# Patient Record
Sex: Female | Born: 1955 | Race: White | Hispanic: No
Health system: Midwestern US, Academic
[De-identification: ages and names within clinical notes are randomized; demographics above are authoritative.]

---

## 2018-07-01 ENCOUNTER — Encounter: Admit: 2018-07-01 | Discharge: 2018-07-02

## 2018-07-01 ENCOUNTER — Encounter: Admit: 2018-07-01 | Discharge: 2018-07-01

## 2018-07-02 ENCOUNTER — Encounter: Admit: 2018-07-02 | Discharge: 2018-07-03

## 2018-07-02 ENCOUNTER — Encounter: Admit: 2018-07-02 | Discharge: 2018-07-02

## 2018-07-07 ENCOUNTER — Encounter: Admit: 2018-07-07 | Discharge: 2018-07-07 | Payer: No Typology Code available for payment source

## 2018-07-08 ENCOUNTER — Encounter: Admit: 2018-07-08 | Discharge: 2018-07-08 | Payer: No Typology Code available for payment source

## 2018-07-08 DIAGNOSIS — C50912 Malignant neoplasm of unspecified site of left female breast: Principal | ICD-10-CM

## 2018-07-16 ENCOUNTER — Encounter: Admit: 2018-07-16 | Discharge: 2018-07-16 | Payer: No Typology Code available for payment source

## 2018-07-17 ENCOUNTER — Encounter: Admit: 2018-07-17 | Discharge: 2018-07-17 | Payer: No Typology Code available for payment source

## 2018-07-17 ENCOUNTER — Encounter: Admit: 2018-07-17 | Discharge: 2018-07-17 | Payer: BC Managed Care – HMO

## 2018-07-17 ENCOUNTER — Ambulatory Visit: Admit: 2018-07-17 | Discharge: 2018-07-17

## 2018-07-17 ENCOUNTER — Ambulatory Visit: Admit: 2018-07-17 | Discharge: 2018-07-18

## 2018-07-17 DIAGNOSIS — D0512 Intraductal carcinoma in situ of left breast: ICD-10-CM

## 2018-07-17 DIAGNOSIS — C50912 Malignant neoplasm of unspecified site of left female breast: ICD-10-CM

## 2018-07-17 DIAGNOSIS — Z170 Estrogen receptor positive status [ER+]: ICD-10-CM

## 2018-07-17 DIAGNOSIS — C50412 Malignant neoplasm of upper-outer quadrant of left female breast: Principal | ICD-10-CM

## 2018-07-17 DIAGNOSIS — Z9189 Other specified personal risk factors, not elsewhere classified: ICD-10-CM

## 2018-07-17 DIAGNOSIS — M1990 Unspecified osteoarthritis, unspecified site: Principal | ICD-10-CM

## 2018-07-20 ENCOUNTER — Encounter: Admit: 2018-07-20 | Discharge: 2018-07-20 | Payer: No Typology Code available for payment source

## 2018-07-20 ENCOUNTER — Encounter: Admit: 2018-07-20 | Discharge: 2018-07-20 | Payer: BC Managed Care – HMO

## 2018-07-20 DIAGNOSIS — C50412 Malignant neoplasm of upper-outer quadrant of left female breast: Principal | ICD-10-CM

## 2018-07-27 ENCOUNTER — Encounter: Admit: 2018-07-27 | Discharge: 2018-07-27 | Payer: No Typology Code available for payment source

## 2018-08-04 ENCOUNTER — Encounter: Admit: 2018-08-04 | Discharge: 2018-08-04 | Payer: No Typology Code available for payment source

## 2018-08-04 DIAGNOSIS — C50412 Malignant neoplasm of upper-outer quadrant of left female breast: Principal | ICD-10-CM

## 2018-08-04 MED ORDER — CEFAZOLIN INJ 1GM IVP
2 g | Freq: Once | INTRAVENOUS | 0 refills | Status: CN
Start: 2018-08-04 — End: ?

## 2018-08-06 ENCOUNTER — Encounter: Admit: 2018-08-06 | Discharge: 2018-08-06 | Payer: No Typology Code available for payment source

## 2018-08-06 DIAGNOSIS — M1990 Unspecified osteoarthritis, unspecified site: Principal | ICD-10-CM

## 2018-08-06 DIAGNOSIS — C801 Malignant (primary) neoplasm, unspecified: ICD-10-CM

## 2018-08-13 ENCOUNTER — Encounter: Admit: 2018-08-13 | Discharge: 2018-08-13 | Payer: 59

## 2018-08-14 ENCOUNTER — Ambulatory Visit: Admit: 2018-08-14 | Discharge: 2018-08-15

## 2018-08-14 ENCOUNTER — Ambulatory Visit: Admit: 2018-08-14 | Discharge: 2018-08-14 | Payer: BC Managed Care – HMO

## 2018-08-14 ENCOUNTER — Ambulatory Visit: Admit: 2018-08-14 | Discharge: 2018-08-14

## 2018-08-14 DIAGNOSIS — C50412 Malignant neoplasm of upper-outer quadrant of left female breast: Secondary | ICD-10-CM

## 2018-08-14 DIAGNOSIS — Z170 Estrogen receptor positive status [ER+]: Secondary | ICD-10-CM

## 2018-08-14 LAB — CBC AND DIFF
Lab: 0.1 10*3/uL (ref 0–0.20)
Lab: 0.2 10*3/uL (ref 0–0.45)
Lab: 0.7 10*3/uL (ref 0–0.80)
Lab: 1.7 10*3/uL (ref 1.0–4.8)
Lab: 10 % (ref 4–12)
Lab: 12 g/dL (ref 12.0–15.0)
Lab: 13 % (ref 11–15)
Lab: 25 % (ref 24–44)
Lab: 3 % (ref >60)
Lab: 3.8 M/UL — ABNORMAL LOW (ref 4.0–5.0)
Lab: 31 pg (ref 26–34)
Lab: 32 g/dL (ref 32.0–36.0)
Lab: 36 % (ref 36–45)
Lab: 4.2 10*3/uL (ref 1.8–7.0)
Lab: 6.9 K/UL (ref 4.5–11.0)
Lab: 61 % (ref 41–77)
Lab: 8.2 FL (ref 7–11)
Lab: 95 FL (ref 80–100)

## 2018-08-14 MED ORDER — LIDOCAINE 1% (BUFFERED) SYRINGE (BREAST CENTER)
4 mL | Freq: Once | INTRAMUSCULAR | 0 refills | Status: CP
Start: 2018-08-14 — End: ?

## 2018-08-15 LAB — COMPREHENSIVE METABOLIC PANEL
Lab: 140 MMOL/L (ref 137–147)
Lab: 4 MMOL/L (ref 3.5–5.1)

## 2018-08-16 ENCOUNTER — Encounter: Admit: 2018-08-16 | Discharge: 2018-08-16 | Payer: 59

## 2018-08-16 ENCOUNTER — Ambulatory Visit: Admit: 2018-08-16 | Discharge: 2018-08-16 | Payer: 59

## 2018-08-16 DIAGNOSIS — M1990 Unspecified osteoarthritis, unspecified site: Secondary | ICD-10-CM

## 2018-08-16 DIAGNOSIS — C801 Malignant (primary) neoplasm, unspecified: Secondary | ICD-10-CM

## 2018-08-17 ENCOUNTER — Ambulatory Visit: Admit: 2018-08-17 | Discharge: 2018-08-17 | Payer: 59

## 2018-08-17 ENCOUNTER — Encounter: Admit: 2018-08-17 | Discharge: 2018-08-17 | Payer: 59

## 2018-08-17 ENCOUNTER — Ambulatory Visit: Admit: 2018-08-17 | Discharge: 2018-08-17 | Payer: BC Managed Care – HMO

## 2018-08-17 ENCOUNTER — Ambulatory Visit: Admit: 2018-08-17 | Discharge: 2018-08-18

## 2018-08-17 DIAGNOSIS — Z803 Family history of malignant neoplasm of breast: Secondary | ICD-10-CM

## 2018-08-17 DIAGNOSIS — C801 Malignant (primary) neoplasm, unspecified: Secondary | ICD-10-CM

## 2018-08-17 DIAGNOSIS — Z170 Estrogen receptor positive status [ER+]: Secondary | ICD-10-CM

## 2018-08-17 DIAGNOSIS — Z8249 Family history of ischemic heart disease and other diseases of the circulatory system: Secondary | ICD-10-CM

## 2018-08-17 DIAGNOSIS — M1990 Unspecified osteoarthritis, unspecified site: Secondary | ICD-10-CM

## 2018-08-17 DIAGNOSIS — C50412 Malignant neoplasm of upper-outer quadrant of left female breast: Secondary | ICD-10-CM

## 2018-08-17 MED ORDER — METOCLOPRAMIDE HCL 5 MG/ML IJ SOLN
10 mg | Freq: Once | INTRAVENOUS | 0 refills | Status: DC | PRN
Start: 2018-08-17 — End: 2018-08-17

## 2018-08-17 MED ORDER — LIDOCAINE (PF) 10 MG/ML (1 %) IJ SOLN
.1-2 mL | Freq: Once | INTRAMUSCULAR | 0 refills | Status: CP
Start: 2018-08-17 — End: ?
  Administered 2018-08-17: 14:00:00 0.2 mL via INTRAMUSCULAR

## 2018-08-17 MED ORDER — FENTANYL CITRATE (PF) 50 MCG/ML IJ SOLN
0 refills | Status: DC
Start: 2018-08-17 — End: 2018-08-17
  Administered 2018-08-17 (×2): 25 ug via INTRAVENOUS

## 2018-08-17 MED ORDER — FENTANYL CITRATE (PF) 50 MCG/ML IJ SOLN
25 ug | INTRAVENOUS | 0 refills | Status: DC | PRN
Start: 2018-08-17 — End: 2018-08-17
  Administered 2018-08-17: 17:00:00 25 ug via INTRAVENOUS

## 2018-08-17 MED ORDER — ISOSULFAN BLUE 1 % SC SOLN
25 mg | Freq: Once | INTRAMUSCULAR | 0 refills | Status: DC
Start: 2018-08-17 — End: 2018-08-17

## 2018-08-17 MED ORDER — OXYCODONE 5 MG PO TAB
5-10 mg | Freq: Once | ORAL | 0 refills | Status: CP | PRN
Start: 2018-08-17 — End: ?
  Administered 2018-08-17: 16:00:00 5 mg via ORAL

## 2018-08-17 MED ORDER — ACETAMINOPHEN 325 MG PO TAB
650 mg | Freq: Once | ORAL | 0 refills | Status: CP
Start: 2018-08-17 — End: ?
  Administered 2018-08-17: 14:00:00 650 mg via ORAL

## 2018-08-17 MED ORDER — FENTANYL CITRATE (PF) 50 MCG/ML IJ SOLN
50 ug | INTRAVENOUS | 0 refills | Status: DC | PRN
Start: 2018-08-17 — End: 2018-08-17

## 2018-08-17 MED ORDER — DEXAMETHASONE SODIUM PHOSPHATE 10 MG/ML IJ SOLN
0 refills | Status: DC
Start: 2018-08-17 — End: 2018-08-17
  Administered 2018-08-17: 14:00:00 10 mg via INTRAVENOUS

## 2018-08-17 MED ORDER — ONDANSETRON HCL (PF) 4 MG/2 ML IJ SOLN
INTRAVENOUS | 0 refills | Status: DC
Start: 2018-08-17 — End: 2018-08-17
  Administered 2018-08-17: 15:00:00 4 mg via INTRAVENOUS

## 2018-08-17 MED ORDER — BUPIVACAINE 0.25 % (2.5 MG/ML) IJ SOLN
0 refills | Status: DC
Start: 2018-08-17 — End: 2018-08-17
  Administered 2018-08-17: 15:00:00 20 mL via INTRAMUSCULAR

## 2018-08-17 MED ORDER — LACTATED RINGERS IV SOLP
INTRAVENOUS | 0 refills | Status: DC
Start: 2018-08-17 — End: 2018-08-17
  Administered 2018-08-17: 14:00:00 1000.000 mL via INTRAVENOUS

## 2018-08-17 MED ORDER — SCOPOLAMINE BASE 1 MG OVER 3 DAYS TD PT3D
1 | Freq: Once | TRANSDERMAL | 0 refills | Status: DC
Start: 2018-08-17 — End: 2018-08-17
  Administered 2018-08-17: 14:00:00 1 via TRANSDERMAL

## 2018-08-17 MED ORDER — ISOSULFAN BLUE 1 % SC SOLN
0 refills | Status: DC
Start: 2018-08-17 — End: 2018-08-17
  Administered 2018-08-17: 14:00:00 2.5 mL via INTRAMUSCULAR

## 2018-08-17 MED ORDER — PATCH DOCUMENTATION - SCOPOLAMINE BASE 1 MG/72HR
1 | Freq: Two times a day (BID) | TRANSDERMAL | 0 refills | Status: DC
Start: 2018-08-17 — End: 2018-08-17

## 2018-08-17 MED ORDER — ACETAMINOPHEN 325 MG PO TAB
650 mg | ORAL_TABLET | ORAL | 0 refills | Status: AC
Start: 2018-08-17 — End: ?

## 2018-08-17 MED ORDER — OXYCODONE 5 MG PO TAB
5-10 mg | ORAL_TABLET | ORAL | 0 refills | 6.00000 days | Status: AC | PRN
Start: 2018-08-17 — End: 2018-11-23

## 2018-08-17 MED ORDER — FAMOTIDINE (PF) 20 MG/2 ML IV SOLN
0 refills | Status: DC
Start: 2018-08-17 — End: 2018-08-17
  Administered 2018-08-17: 14:00:00 20 mg via INTRAVENOUS

## 2018-08-17 MED ORDER — GABAPENTIN 300 MG PO CAP
300 mg | Freq: Once | ORAL | 0 refills | Status: CP
Start: 2018-08-17 — End: ?

## 2018-08-17 MED ORDER — LIDOCAINE (PF) 200 MG/10 ML (2 %) IJ SYRG
0 refills | Status: DC
Start: 2018-08-17 — End: 2018-08-17
  Administered 2018-08-17: 14:00:00 80 mg via INTRAVENOUS

## 2018-08-17 MED ORDER — RP DX TC-99M SULF COLL MCI
.5 | Freq: Once | INTRAVENOUS | 0 refills | Status: CP
Start: 2018-08-17 — End: ?
  Administered 2018-08-17: 13:00:00 0.5 via INTRAVENOUS

## 2018-08-17 MED ORDER — DIPHENHYDRAMINE HCL 50 MG/ML IJ SOLN
0 refills | Status: DC
Start: 2018-08-17 — End: 2018-08-17
  Administered 2018-08-17: 14:00:00 25 mg via INTRAVENOUS

## 2018-08-17 MED ORDER — PROPOFOL INJ 10 MG/ML IV VIAL
0 refills | Status: DC
Start: 2018-08-17 — End: 2018-08-17
  Administered 2018-08-17: 14:00:00 140 mg via INTRAVENOUS
  Administered 2018-08-17: 14:00:00 60 mg via INTRAVENOUS

## 2018-08-17 MED ORDER — SENNOSIDES-DOCUSATE SODIUM 8.6-50 MG PO TAB
1 | ORAL_TABLET | Freq: Two times a day (BID) | ORAL | 1 refills | Status: AC
Start: 2018-08-17 — End: 2018-11-23

## 2018-08-17 MED ORDER — CEFAZOLIN INJ 1GM IVP
2 g | Freq: Once | INTRAVENOUS | 0 refills | Status: CP
Start: 2018-08-17 — End: ?
  Administered 2018-08-17: 14:00:00 2 g via INTRAVENOUS

## 2018-08-17 MED ORDER — MIDAZOLAM 1 MG/ML IJ SOLN
INTRAVENOUS | 0 refills | Status: DC
Start: 2018-08-17 — End: 2018-08-17
  Administered 2018-08-17: 14:00:00 2 mg via INTRAVENOUS

## 2018-08-17 MED ORDER — PROPOFOL 10 MG/ML IV EMUL 20 ML (INFUSION)(AM)(OR)
0 refills | Status: DC
Start: 2018-08-17 — End: 2018-08-17
  Administered 2018-08-17: 14:00:00 140 ug/kg/min via INTRAVENOUS
  Administered 2018-08-17 (×3): 20.000 mL via INTRAVENOUS

## 2018-08-17 MED ADMIN — GABAPENTIN 300 MG PO CAP [18308]: 300 mg | ORAL | @ 14:00:00 | Stop: 2018-08-17 | NDC 63739023610

## 2018-08-17 NOTE — Anesthesia Post-Procedure Evaluation
Post-Anesthesia Evaluation    Name: Alicia Sharp      MRN: [id number]     DOB: 06-Oct-1955     Age: 63 y.o.     Sex: female   __________________________________________________________________________     Procedure Date: 08/17/2018  Procedure(s) (LRB):  LEFT RADIOACTIVE SEED LOCALIZED LUMPECTOMY (Left)  IDENTIFICATION SENTINEL LYMPH NODE (Left)  INJECTION RADIOACTIVE TRACER FOR SENTINEL NODE IDENTIFICATION (Left)  LEFT SENTINEL LYMPH NODE BIOPSY (Left)      Surgeon: Surgeon(s):  Liebscher, Lucienne Capers., MD  Ignatius Specking, MD    Post-Anesthesia Vitals  BP: 123/57 (01/06 1100)  Temp: 36.5 ???C (97.7 ???F) (01/06 0945)  Pulse: 62 (01/06 1100)  Respirations: 15 PER MINUTE (01/06 1100)  SpO2: 95 % (01/06 1100)  SpO2 Pulse: 61 (01/06 1100)  Height: 161.3 cm (63.5) (01/06 0703)   Vitals Value Taken Time   BP 123/57 08/17/2018 11:00 AM   Temp 36.5 ???C (97.7 ???F) 08/17/2018  9:45 AM   Pulse 62 08/17/2018 11:00 AM   Respirations 15 PER MINUTE 08/17/2018 11:00 AM   SpO2 95 % 08/17/2018 11:00 AM         Post Anesthesia Evaluation Note    Evaluation location: other  Patient participation: recovered; patient participated in evaluation  Level of consciousness: alert    Pain score: 1  Pain management: adequate    Hydration: normovolemia  Temperature: 36.0???C - 38.4???C  Airway patency: adequate    Perioperative Events       Post-op nausea and vomiting: no PONV    Postoperative Status  Cardiovascular status: hemodynamically stable  Respiratory status: spontaneous ventilation  Follow-up needed: none        Perioperative Events  Perioperative Event: Yes  Emergency Case Activation: No  Pulmonary: laryngospasm     May d/c from anesthesia care.    Steele Berg, MD

## 2018-08-19 ENCOUNTER — Encounter: Admit: 2018-08-19 | Discharge: 2018-08-19 | Payer: 59

## 2018-08-19 DIAGNOSIS — C801 Malignant (primary) neoplasm, unspecified: Secondary | ICD-10-CM

## 2018-08-19 DIAGNOSIS — M1990 Unspecified osteoarthritis, unspecified site: Secondary | ICD-10-CM

## 2018-08-21 ENCOUNTER — Encounter: Admit: 2018-08-21 | Discharge: 2018-08-21 | Payer: 59

## 2018-08-21 DIAGNOSIS — C50412 Malignant neoplasm of upper-outer quadrant of left female breast: Secondary | ICD-10-CM

## 2018-08-24 ENCOUNTER — Encounter: Admit: 2018-08-24 | Discharge: 2018-08-24 | Payer: 59

## 2018-08-25 ENCOUNTER — Encounter: Admit: 2018-08-25 | Discharge: 2018-08-25 | Payer: 59

## 2018-08-25 DIAGNOSIS — M1990 Unspecified osteoarthritis, unspecified site: Secondary | ICD-10-CM

## 2018-08-25 DIAGNOSIS — C50919 Malignant neoplasm of unspecified site of unspecified female breast: Secondary | ICD-10-CM

## 2018-08-27 ENCOUNTER — Encounter: Admit: 2018-08-27 | Discharge: 2018-08-27 | Payer: 59

## 2018-08-27 ENCOUNTER — Encounter: Admit: 2018-08-27 | Discharge: 2018-08-27 | Payer: BC Managed Care – HMO

## 2018-08-27 DIAGNOSIS — Z170 Estrogen receptor positive status [ER+]: Secondary | ICD-10-CM

## 2018-08-27 DIAGNOSIS — C50919 Malignant neoplasm of unspecified site of unspecified female breast: Secondary | ICD-10-CM

## 2018-08-27 DIAGNOSIS — C50412 Malignant neoplasm of upper-outer quadrant of left female breast: Secondary | ICD-10-CM

## 2018-08-27 DIAGNOSIS — M1990 Unspecified osteoarthritis, unspecified site: Secondary | ICD-10-CM

## 2018-08-27 DIAGNOSIS — Z9889 Other specified postprocedural states: Secondary | ICD-10-CM

## 2018-09-02 ENCOUNTER — Encounter: Admit: 2018-09-02 | Discharge: 2018-09-02 | Payer: 59

## 2018-09-02 DIAGNOSIS — C50412 Malignant neoplasm of upper-outer quadrant of left female breast: Secondary | ICD-10-CM

## 2018-09-04 ENCOUNTER — Encounter: Admit: 2018-09-04 | Discharge: 2018-09-04 | Payer: 59

## 2018-09-04 ENCOUNTER — Encounter: Admit: 2018-09-04 | Discharge: 2018-09-04 | Payer: BC Managed Care – HMO

## 2018-09-04 DIAGNOSIS — Z780 Asymptomatic menopausal state: Secondary | ICD-10-CM

## 2018-09-04 DIAGNOSIS — Z170 Estrogen receptor positive status [ER+]: Secondary | ICD-10-CM

## 2018-09-04 DIAGNOSIS — M1990 Unspecified osteoarthritis, unspecified site: Secondary | ICD-10-CM

## 2018-09-04 DIAGNOSIS — Z13820 Encounter for screening for osteoporosis: Secondary | ICD-10-CM

## 2018-09-04 DIAGNOSIS — C50412 Malignant neoplasm of upper-outer quadrant of left female breast: Secondary | ICD-10-CM

## 2018-09-04 DIAGNOSIS — C50919 Malignant neoplasm of unspecified site of unspecified female breast: Secondary | ICD-10-CM

## 2018-09-07 ENCOUNTER — Encounter: Admit: 2018-09-07 | Discharge: 2018-09-07 | Payer: 59

## 2018-09-10 ENCOUNTER — Ambulatory Visit: Admit: 2018-09-10 | Discharge: 2018-09-10 | Payer: 59

## 2018-09-10 ENCOUNTER — Encounter: Admit: 2018-09-10 | Discharge: 2018-09-10 | Payer: 59

## 2018-09-10 DIAGNOSIS — C50919 Malignant neoplasm of unspecified site of unspecified female breast: Secondary | ICD-10-CM

## 2018-09-10 DIAGNOSIS — M1990 Unspecified osteoarthritis, unspecified site: Secondary | ICD-10-CM

## 2018-09-11 ENCOUNTER — Encounter: Admit: 2018-09-11 | Discharge: 2018-09-11 | Payer: 59

## 2018-09-11 ENCOUNTER — Encounter: Admit: 2018-09-11 | Discharge: 2018-09-11 | Payer: BC Managed Care – HMO

## 2018-09-11 ENCOUNTER — Ambulatory Visit: Admit: 2018-08-27 | Discharge: 2018-09-11 | Payer: BC Managed Care – HMO

## 2018-09-11 DIAGNOSIS — C50412 Malignant neoplasm of upper-outer quadrant of left female breast: Secondary | ICD-10-CM

## 2018-09-11 DIAGNOSIS — Z170 Estrogen receptor positive status [ER+]: Secondary | ICD-10-CM

## 2018-09-11 DIAGNOSIS — 1 ERRONEOUS ENCOUNTER--DISREGARD: Secondary | ICD-10-CM

## 2018-09-12 ENCOUNTER — Ambulatory Visit: Admit: 2018-09-12 | Discharge: 2018-09-26 | Payer: BC Managed Care – HMO

## 2018-09-21 ENCOUNTER — Ambulatory Visit: Admit: 2018-09-21 | Discharge: 2018-09-21 | Payer: No Typology Code available for payment source

## 2018-09-21 ENCOUNTER — Encounter: Admit: 2018-09-21 | Discharge: 2018-09-21 | Payer: No Typology Code available for payment source

## 2018-09-21 DIAGNOSIS — C50412 Malignant neoplasm of upper-outer quadrant of left female breast: Principal | ICD-10-CM

## 2018-09-21 DIAGNOSIS — M1990 Unspecified osteoarthritis, unspecified site: Principal | ICD-10-CM

## 2018-09-21 DIAGNOSIS — C50919 Malignant neoplasm of unspecified site of unspecified female breast: ICD-10-CM

## 2018-09-22 ENCOUNTER — Encounter: Admit: 2018-09-22 | Discharge: 2018-09-22 | Payer: No Typology Code available for payment source

## 2018-09-23 ENCOUNTER — Encounter: Admit: 2018-09-23 | Discharge: 2018-09-23 | Payer: No Typology Code available for payment source

## 2018-09-23 ENCOUNTER — Ambulatory Visit: Admit: 2018-09-23 | Discharge: 2018-09-23 | Payer: No Typology Code available for payment source

## 2018-09-23 DIAGNOSIS — M1990 Unspecified osteoarthritis, unspecified site: Principal | ICD-10-CM

## 2018-09-23 DIAGNOSIS — C50919 Malignant neoplasm of unspecified site of unspecified female breast: ICD-10-CM

## 2018-09-24 ENCOUNTER — Encounter: Admit: 2018-09-24 | Discharge: 2018-09-24 | Payer: No Typology Code available for payment source

## 2018-09-25 ENCOUNTER — Encounter: Admit: 2018-09-25 | Discharge: 2018-09-25 | Payer: No Typology Code available for payment source

## 2018-09-26 DIAGNOSIS — C50412 Malignant neoplasm of upper-outer quadrant of left female breast: Principal | ICD-10-CM

## 2018-09-27 ENCOUNTER — Ambulatory Visit: Admit: 2018-09-27 | Discharge: 2018-10-10 | Payer: BC Managed Care – HMO

## 2018-09-28 ENCOUNTER — Ambulatory Visit: Admit: 2018-09-28 | Discharge: 2018-09-28 | Payer: No Typology Code available for payment source

## 2018-09-28 ENCOUNTER — Encounter: Admit: 2018-09-28 | Discharge: 2018-09-28 | Payer: No Typology Code available for payment source

## 2018-09-29 ENCOUNTER — Encounter: Admit: 2018-09-29 | Discharge: 2018-09-29 | Payer: No Typology Code available for payment source

## 2018-09-30 ENCOUNTER — Encounter: Admit: 2018-09-30 | Discharge: 2018-09-30 | Payer: No Typology Code available for payment source

## 2018-10-01 ENCOUNTER — Encounter: Admit: 2018-10-01 | Discharge: 2018-10-01 | Payer: No Typology Code available for payment source

## 2018-10-02 ENCOUNTER — Encounter: Admit: 2018-10-02 | Discharge: 2018-10-02 | Payer: No Typology Code available for payment source

## 2018-10-05 ENCOUNTER — Encounter: Admit: 2018-10-05 | Discharge: 2018-10-05 | Payer: No Typology Code available for payment source

## 2018-10-05 ENCOUNTER — Ambulatory Visit: Admit: 2018-10-05 | Discharge: 2018-10-05 | Payer: No Typology Code available for payment source

## 2018-10-05 DIAGNOSIS — C50412 Malignant neoplasm of upper-outer quadrant of left female breast: Principal | ICD-10-CM

## 2018-10-06 ENCOUNTER — Encounter: Admit: 2018-10-06 | Discharge: 2018-10-06 | Payer: No Typology Code available for payment source

## 2018-10-07 ENCOUNTER — Encounter: Admit: 2018-10-07 | Discharge: 2018-10-07 | Payer: No Typology Code available for payment source

## 2018-10-08 ENCOUNTER — Encounter: Admit: 2018-10-08 | Discharge: 2018-10-08 | Payer: No Typology Code available for payment source

## 2018-10-09 ENCOUNTER — Encounter: Admit: 2018-10-09 | Discharge: 2018-10-09 | Payer: No Typology Code available for payment source

## 2018-10-10 DIAGNOSIS — C50412 Malignant neoplasm of upper-outer quadrant of left female breast: Principal | ICD-10-CM

## 2018-10-11 ENCOUNTER — Ambulatory Visit: Admit: 2018-10-11 | Discharge: 2018-10-25 | Payer: BC Managed Care – HMO

## 2018-10-12 ENCOUNTER — Encounter: Admit: 2018-10-12 | Discharge: 2018-10-12 | Payer: No Typology Code available for payment source

## 2018-10-12 ENCOUNTER — Ambulatory Visit: Admit: 2018-10-12 | Discharge: 2018-10-12 | Payer: No Typology Code available for payment source

## 2018-10-12 MED ORDER — SILVER SULFADIAZINE 1 % TP CREA
Freq: Three times a day (TID) | TOPICAL | 3 refills | 10.00000 days | Status: AC | PRN
Start: 2018-10-12 — End: 2018-11-23

## 2018-10-13 ENCOUNTER — Encounter: Admit: 2018-10-13 | Discharge: 2018-10-13 | Payer: No Typology Code available for payment source

## 2018-10-14 ENCOUNTER — Encounter: Admit: 2018-10-14 | Discharge: 2018-10-14 | Payer: No Typology Code available for payment source

## 2018-10-15 ENCOUNTER — Encounter: Admit: 2018-10-15 | Discharge: 2018-10-15 | Payer: No Typology Code available for payment source

## 2018-10-16 ENCOUNTER — Encounter: Admit: 2018-10-16 | Discharge: 2018-10-16 | Payer: No Typology Code available for payment source

## 2018-10-23 ENCOUNTER — Encounter: Admit: 2018-10-23 | Discharge: 2018-10-23 | Payer: No Typology Code available for payment source

## 2018-10-25 DIAGNOSIS — C50412 Malignant neoplasm of upper-outer quadrant of left female breast: Principal | ICD-10-CM

## 2018-10-26 ENCOUNTER — Ambulatory Visit: Admit: 2018-10-26 | Discharge: 2018-11-10 | Payer: BC Managed Care – HMO

## 2018-10-26 ENCOUNTER — Encounter: Admit: 2018-10-26 | Discharge: 2018-11-10 | Payer: BC Managed Care – HMO

## 2018-10-26 ENCOUNTER — Encounter: Admit: 2018-10-26 | Discharge: 2018-10-26 | Payer: No Typology Code available for payment source

## 2018-10-26 DIAGNOSIS — M799 Soft tissue disorder, unspecified: Principal | ICD-10-CM

## 2018-10-28 ENCOUNTER — Encounter: Admit: 2018-10-28 | Discharge: 2018-10-28 | Payer: No Typology Code available for payment source

## 2018-10-28 MED ORDER — ANASTROZOLE 1 MG PO TAB
1 mg | ORAL_TABLET | Freq: Every day | ORAL | 3 refills | 33.00000 days | Status: AC
Start: 2018-10-28 — End: 2019-05-03

## 2018-10-30 ENCOUNTER — Encounter: Admit: 2018-10-30 | Discharge: 2018-10-30 | Payer: No Typology Code available for payment source

## 2018-11-03 ENCOUNTER — Encounter: Admit: 2018-11-03 | Discharge: 2018-11-03 | Payer: No Typology Code available for payment source

## 2018-11-10 ENCOUNTER — Encounter: Admit: 2018-10-21 | Discharge: 2018-10-22

## 2018-11-10 ENCOUNTER — Encounter: Admit: 2018-10-14 | Discharge: 2018-11-10 | Payer: BC Managed Care – HMO

## 2018-11-10 DIAGNOSIS — M25512 Pain in left shoulder: ICD-10-CM

## 2018-11-10 DIAGNOSIS — Z170 Estrogen receptor positive status [ER+]: ICD-10-CM

## 2018-11-10 DIAGNOSIS — C50412 Malignant neoplasm of upper-outer quadrant of left female breast: ICD-10-CM

## 2018-11-10 DIAGNOSIS — M799 Soft tissue disorder, unspecified: Principal | ICD-10-CM

## 2018-11-23 ENCOUNTER — Encounter: Admit: 2018-11-23 | Discharge: 2018-11-23 | Payer: No Typology Code available for payment source

## 2018-11-23 ENCOUNTER — Ambulatory Visit: Admit: 2018-11-23 | Discharge: 2018-11-24 | Payer: No Typology Code available for payment source

## 2018-11-23 DIAGNOSIS — C50919 Malignant neoplasm of unspecified site of unspecified female breast: ICD-10-CM

## 2018-11-23 DIAGNOSIS — M1990 Unspecified osteoarthritis, unspecified site: Principal | ICD-10-CM

## 2018-11-23 NOTE — Progress Notes
Gastrointestinal: Negative for abdominal pain, blood in stool, constipation, diarrhea, nausea and vomiting.   Endocrine: Positive for cold intolerance. Negative for heat intolerance.   Genitourinary: Negative for dysuria and hematuria.   Musculoskeletal: Negative for back pain.   Skin: Negative for color change.   Allergic/Immunologic: Negative.    Neurological: Negative for dizziness, tremors, weakness and headaches.   Psychiatric/Behavioral: Negative for decreased concentration.   All other systems reviewed and are negative.        Objective:         ??? anastrozole (ARIMIDEX) 1 mg tablet Take one tablet by mouth daily.   ??? cetirizine (ZYRTEC) 10 mg tablet Take 10 mg by mouth every morning.   ??? cholecalciferol (VITAMIN D-3) 1,000 units tablet    ??? coenzyme Q10 100 mg cap Take 100 mg by mouth daily.   ??? diclofenac (VOLTAREN) 1 % topical gel    ??? folic acid 400 mcg tablet Take 800 mcg by mouth daily.   ??? meloxicam (MOBIC) 15 mg tablet    ??? Omega-3-DHA-EPA-Fish Oil (FISH OIL) 1,000 mg (120 mg-180 mg) cap Take  by mouth daily.     Vitals:    11/23/18 1519   Resp: 16   Temp: 36.7 ???C (98 ???F)   TempSrc: Oral   Weight: 81.6 kg (179 lb 12.8 oz)   Height: 161.3 cm (63.5)     Body mass index is 31.35 kg/m???.     Physical Exam  Vitals signs and nursing note reviewed.   Constitutional:       General: She is not in acute distress.     Appearance: Normal appearance. She is not ill-appearing.   HENT:      Head: Normocephalic and atraumatic.   Eyes:      General: Lids are normal. No scleral icterus.     Conjunctiva/sclera: Conjunctivae normal.   Pulmonary:      Effort: Pulmonary effort is normal. No tachypnea, bradypnea or respiratory distress.   Neurological:      General: No focal deficit present.      Mental Status: She is alert and oriented to person, place, and time.   Psychiatric:         Attention and Perception: Attention normal.         Mood and Affect: Mood normal.         Speech: Speech normal.

## 2018-11-24 ENCOUNTER — Encounter: Admit: 2018-11-24 | Discharge: 2018-11-24 | Payer: No Typology Code available for payment source

## 2018-11-24 DIAGNOSIS — M1990 Unspecified osteoarthritis, unspecified site: Principal | ICD-10-CM

## 2018-11-24 DIAGNOSIS — C50412 Malignant neoplasm of upper-outer quadrant of left female breast: Secondary | ICD-10-CM

## 2018-11-24 DIAGNOSIS — C50919 Malignant neoplasm of unspecified site of unspecified female breast: ICD-10-CM

## 2018-11-24 DIAGNOSIS — Z170 Estrogen receptor positive status [ER+]: ICD-10-CM

## 2018-11-24 DIAGNOSIS — R200 Anesthesia of skin: ICD-10-CM

## 2018-11-24 DIAGNOSIS — Z0000 Encounter for general adult medical examination without abnormal findings: ICD-10-CM

## 2018-11-24 DIAGNOSIS — R202 Paresthesia of skin: ICD-10-CM

## 2018-11-24 DIAGNOSIS — G8929 Other chronic pain: ICD-10-CM

## 2018-11-24 DIAGNOSIS — Z1211 Encounter for screening for malignant neoplasm of colon: ICD-10-CM

## 2018-11-24 DIAGNOSIS — M25569 Pain in unspecified knee: Principal | ICD-10-CM

## 2018-11-25 ENCOUNTER — Encounter: Admit: 2018-11-25 | Discharge: 2018-11-25 | Payer: No Typology Code available for payment source

## 2018-11-25 DIAGNOSIS — Z0000 Encounter for general adult medical examination without abnormal findings: Principal | ICD-10-CM

## 2018-11-25 DIAGNOSIS — R200 Anesthesia of skin: ICD-10-CM

## 2018-11-27 ENCOUNTER — Encounter: Admit: 2018-11-27 | Discharge: 2018-11-27 | Payer: No Typology Code available for payment source

## 2018-11-27 DIAGNOSIS — Z0000 Encounter for general adult medical examination without abnormal findings: Principal | ICD-10-CM

## 2018-11-30 ENCOUNTER — Encounter: Admit: 2018-11-30 | Discharge: 2018-11-30 | Payer: No Typology Code available for payment source

## 2018-12-01 ENCOUNTER — Ambulatory Visit: Admit: 2018-12-01 | Discharge: 2018-12-01 | Payer: No Typology Code available for payment source

## 2018-12-01 ENCOUNTER — Encounter: Admit: 2018-12-01 | Discharge: 2018-12-01 | Payer: No Typology Code available for payment source

## 2018-12-01 DIAGNOSIS — Z0000 Encounter for general adult medical examination without abnormal findings: Principal | ICD-10-CM

## 2018-12-01 LAB — CBC AND DIFF
Lab: 10 % (ref 4–12)
Lab: 13 % (ref 11–15)
Lab: 2 % (ref >60)
Lab: 25 % (ref 24–44)
Lab: 4 % (ref >60)
Lab: 4.3 10*3/uL — ABNORMAL LOW (ref 4.5–11.0)
Lab: 59 % (ref 41–77)
Lab: 7.2 FL (ref 7–11)

## 2018-12-01 LAB — VITAMIN B12: Lab: 214 pg/mL (ref 180–914)

## 2018-12-01 LAB — COMPREHENSIVE METABOLIC PANEL: Lab: 4 MMOL/L — ABNORMAL HIGH (ref <100)

## 2018-12-01 LAB — TSH WITH FREE T4 REFLEX: Lab: 2.2 uU/mL — ABNORMAL LOW (ref 0.35–5.00)

## 2018-12-01 LAB — LIPID PROFILE
Lab: 218 mg/dL — ABNORMAL HIGH (ref <200)
Lab: 76 mg/dL (ref <150)

## 2018-12-03 ENCOUNTER — Encounter: Admit: 2018-12-03 | Discharge: 2018-12-03 | Payer: No Typology Code available for payment source

## 2018-12-03 DIAGNOSIS — D72810 Lymphocytopenia: ICD-10-CM

## 2018-12-03 DIAGNOSIS — E538 Deficiency of other specified B group vitamins: ICD-10-CM

## 2018-12-03 DIAGNOSIS — D649 Anemia, unspecified: Principal | ICD-10-CM

## 2018-12-03 DIAGNOSIS — E7800 Pure hypercholesterolemia, unspecified: ICD-10-CM

## 2018-12-03 MED ORDER — CYANOCOBALAMIN (VITAMIN B-12) 1,000 MCG PO TAB
1000 ug | ORAL_TABLET | Freq: Every day | ORAL | 3 refills | 29.00000 days | Status: DC
Start: 2018-12-03 — End: 2019-07-12

## 2018-12-03 NOTE — Telephone Encounter
I called the patient to discuss her results. We discussed her B12 level being at the lower end of normal. She would like to try B12 replacement at this time and then see if her neuropathy symptoms improve. We will repeat her B12 level in 3 months as well as her blood counts. We discussed that she has slight normocytic anemia and leukopenia. Will repeat CBC with diff and iron studies as well as B12 in 3 months. Kidney, liver, and thyroid function normal. Vit D normal. We discussed her cholesterol panel with low-intermediate 10y ASCVD risk factor at 5%. She is hesitant to start cholesterol medicine now. Will order coronary calcium scan (to be scheduled after stay at home ban lifted) to guide cholesterol treatment. We also reviewed her labs from November 2019 and trend in cholesterol.       The 10-year ASCVD risk score Denman George DC Montez Hageman., et al., 2013) is: 5%    Values used to calculate the score:      Age: 64 years      Sex: Female      Is Non-Hispanic African American: No      Diabetic: No      Tobacco smoker: No      Systolic Blood Pressure: 135 mmHg      Is BP treated: No      HDL Cholesterol: 60 MG/DL      Total Cholesterol: 218 MG/DL

## 2018-12-14 ENCOUNTER — Encounter: Admit: 2018-12-14 | Discharge: 2018-12-14 | Payer: No Typology Code available for payment source

## 2018-12-14 DIAGNOSIS — E7800 Pure hypercholesterolemia, unspecified: Principal | ICD-10-CM

## 2018-12-18 ENCOUNTER — Encounter: Admit: 2018-12-18 | Discharge: 2018-12-18 | Payer: No Typology Code available for payment source

## 2018-12-21 ENCOUNTER — Encounter: Admit: 2018-12-21 | Discharge: 2018-12-21 | Payer: No Typology Code available for payment source

## 2018-12-21 ENCOUNTER — Ambulatory Visit: Admit: 2018-12-21 | Discharge: 2018-12-21 | Payer: No Typology Code available for payment source

## 2018-12-21 DIAGNOSIS — E7800 Pure hypercholesterolemia, unspecified: Principal | ICD-10-CM

## 2018-12-21 NOTE — Telephone Encounter
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 After following the recommended interventions over the weekend, Alicia Sharp reports her left breast feels less full and the tingling/twinge sensation has disappeared. She's now experiencing a more pronounced discomfort/tenderness along her incisional scar line. Discussed that this is likely due to movement of fluid and not worrisome as long as signs/symptoms continue to improve or remain the same. Informed patient to contact us if she begins to experience worsening signs/symptoms. Plan to continue with current interventions and follow-up in clinic for evaluation in 4 weeks. Patient is agreeable with plan. Verified she has clinic's contact information should any questions or concerns arise.

## 2018-12-22 ENCOUNTER — Encounter: Admit: 2018-12-22 | Discharge: 2018-12-22 | Payer: No Typology Code available for payment source

## 2018-12-23 ENCOUNTER — Encounter: Admit: 2018-12-23 | Discharge: 2018-12-23 | Payer: No Typology Code available for payment source

## 2018-12-23 DIAGNOSIS — R918 Other nonspecific abnormal finding of lung field: Principal | ICD-10-CM

## 2018-12-23 MED ORDER — CAPSAICIN 0.025 % TP CREA
Freq: Every day | 3 refills | 30.00000 days | Status: DC | PRN
Start: 2018-12-23 — End: 2019-03-30

## 2018-12-23 MED ORDER — ATORVASTATIN 40 MG PO TAB
40 mg | ORAL_TABLET | Freq: Every day | ORAL | 3 refills | Status: DC
Start: 2018-12-23 — End: 2019-09-27

## 2019-01-05 ENCOUNTER — Encounter: Admit: 2019-01-05 | Discharge: 2019-01-05 | Payer: No Typology Code available for payment source

## 2019-01-06 ENCOUNTER — Encounter: Admit: 2019-01-06 | Discharge: 2019-01-06 | Payer: No Typology Code available for payment source

## 2019-01-06 DIAGNOSIS — Z9189 Other specified personal risk factors, not elsewhere classified: Principal | ICD-10-CM

## 2019-01-08 ENCOUNTER — Encounter: Admit: 2019-01-08 | Discharge: 2019-01-08 | Payer: No Typology Code available for payment source

## 2019-01-08 DIAGNOSIS — C50412 Malignant neoplasm of upper-outer quadrant of left female breast: Principal | ICD-10-CM

## 2019-01-11 ENCOUNTER — Encounter: Admit: 2019-01-11 | Discharge: 2019-01-11

## 2019-01-14 ENCOUNTER — Encounter: Admit: 2019-01-14 | Discharge: 2019-01-14

## 2019-01-18 ENCOUNTER — Encounter: Admit: 2019-01-18 | Discharge: 2019-01-18

## 2019-01-18 ENCOUNTER — Encounter: Admit: 2019-01-18 | Discharge: 2019-02-09

## 2019-01-18 DIAGNOSIS — C50919 Malignant neoplasm of unspecified site of unspecified female breast: Secondary | ICD-10-CM

## 2019-01-18 DIAGNOSIS — M25511 Pain in right shoulder: Secondary | ICD-10-CM

## 2019-01-18 DIAGNOSIS — M25512 Pain in left shoulder: Secondary | ICD-10-CM

## 2019-01-18 DIAGNOSIS — M1990 Unspecified osteoarthritis, unspecified site: Secondary | ICD-10-CM

## 2019-01-18 DIAGNOSIS — Z23 Encounter for immunization: Principal | ICD-10-CM

## 2019-01-18 DIAGNOSIS — M799 Soft tissue disorder, unspecified: Principal | ICD-10-CM

## 2019-01-18 DIAGNOSIS — E7800 Pure hypercholesterolemia, unspecified: Secondary | ICD-10-CM

## 2019-01-18 DIAGNOSIS — Z0000 Encounter for general adult medical examination without abnormal findings: Secondary | ICD-10-CM

## 2019-01-18 DIAGNOSIS — R202 Paresthesia of skin: Secondary | ICD-10-CM

## 2019-01-18 MED ORDER — TRIAMCINOLONE ACETONIDE 0.025 % TP CREA
Freq: Two times a day (BID) | TOPICAL | 1 refills | Status: DC
Start: 2019-01-18 — End: 2019-05-03

## 2019-01-18 NOTE — Telephone Encounter
Ms. Panella called to clarify message sent over regarding an image taken at PT today at the lymphedema therapy clinic. Ms. Bramblett stated it was an image taken of her axilla and PT would be sending it to Dr. Konrad Saha nurse, Baxter Flattery. Will contact Dr. Konrad Saha team to clarify if Dr. Junious Silk is needed to review photo or be a part of the treatment plan.

## 2019-01-18 NOTE — Progress Notes
Date of Service: 01/18/2019    Subjective:             Alicia Sharp is a 63 y.o. female.    History of Present Illness  Alicia Sharp is a 63 y.o. female with history of breast cancer who presents today for 3 month follow up for labs and pap smear.  Since her last visit, patient reports worsening right foot numbness.  Paresthesias are located to right foot.  Starts in second and third right toes with some radiation to the top of her foot.  Numbness is constant.  She has not been the capsaicin cream, as she says that she does not have any symptoms of pain, just numbness.  She also was not wanting to take gabapentin.  She is fixated on this numbness and does not want to take any medications for it.  She has been compliant with vitamin B12 and folic acid replacement.  She denies any symptoms anywhere else other than her right foot.  She does not think anything makes it better or worse.  She denies any other neurologic symptoms including headache, loss of consciousness, seizures, and balance or gait abnormalities.  She also complains today of right axilla rash that has been present for 3 weeks.  She notes taking a topical oil since starting her radiation.  She does not think that this is the culprit for her rash.  She denies any bug bites or trauma to the area.  She has contacted her radiation oncologist nurse for further recommendations.  She denies fever, chills.  She notes that the area is itchy, but not painful.       Review of Systems   Constitutional: Negative for activity change, chills, fatigue, fever and unexpected weight change.   HENT: Negative for congestion, ear pain, hearing loss, sinus pressure, sore throat and tinnitus.    Eyes: Negative for pain, discharge, itching and visual disturbance.   Respiratory: Negative for cough, choking, shortness of breath and wheezing.    Cardiovascular: Positive for leg swelling. Negative for chest pain. Gastrointestinal: Negative for abdominal pain, blood in stool, nausea and vomiting.   Endocrine: Negative for cold intolerance and heat intolerance.   Genitourinary: Negative for decreased urine volume, difficulty urinating, dysuria, frequency, genital sores, hematuria, pelvic pain, urgency, vaginal bleeding, vaginal discharge and vaginal pain.   Musculoskeletal: Negative for back pain and neck pain.   Neurological: Negative for dizziness, tremors, seizures, syncope, weakness, numbness and headaches.   Hematological: Negative for adenopathy. Does not bruise/bleed easily.   Psychiatric/Behavioral: Negative for behavioral problems, confusion and decreased concentration.         Objective:         ??? anastrozole (ARIMIDEX) 1 mg tablet Take one tablet by mouth daily.   ??? atorvastatin (LIPITOR) 40 mg tablet Take one tablet by mouth daily.   ??? capsaicin 0.025 % topical cream Apply to affected area for numbness daily as needed.   ??? cetirizine (ZYRTEC) 10 mg tablet Take 10 mg by mouth every morning.   ??? cholecalciferol (VITAMIN D-3) 1,000 units tablet    ??? coenzyme Q10 100 mg cap Take 100 mg by mouth daily.   ??? cyanocobalamin 1,000 mcg tablet Take one tablet by mouth daily.   ??? diclofenac (VOLTAREN) 1 % topical gel    ??? folic acid 400 mcg tablet Take 800 mcg by mouth daily.   ??? meloxicam (MOBIC) 15 mg tablet    ??? Omega-3-DHA-EPA-Fish Oil (FISH OIL) 1,000 mg (120  mg-180 mg) cap Take  by mouth daily.     Vitals:    01/18/19 1309   BP: 130/78   BP Source: Arm, Right Upper   Patient Position: Sitting   Pulse: 64   Resp: 14   Weight: 83 kg (183 lb)   Height: 161.3 cm (63.5)   PainSc: Three     Body mass index is 31.9 kg/m???.     Physical Exam  Constitutional:       Appearance: Normal appearance. She is obese.   HENT:      Head: Normocephalic and atraumatic.      Right Ear: External ear normal.      Left Ear: External ear normal.   Eyes:      General: No scleral icterus.        Right eye: No discharge. Left eye: No discharge.      Extraocular Movements: Extraocular movements intact.      Conjunctiva/sclera: Conjunctivae normal.      Pupils: Pupils are equal, round, and reactive to light.   Neck:      Musculoskeletal: Normal range of motion and neck supple. No neck rigidity or muscular tenderness.   Cardiovascular:      Rate and Rhythm: Normal rate and regular rhythm.      Heart sounds: No murmur.   Pulmonary:      Effort: No respiratory distress.      Breath sounds: No wheezing.   Abdominal:      General: Bowel sounds are normal. There is no distension.      Palpations: Abdomen is soft. There is no mass.   Genitourinary:     General: Normal vulva.      Pubic Area: No rash.       Vagina: No signs of injury. No vaginal discharge, erythema or bleeding.      Cervix: Friability and erythema present. No discharge.      Uterus: Normal.       Adnexa: Right adnexa normal and left adnexa normal.   Musculoskeletal:      Comments: Trace edema present bilaterally along calves   Lymphadenopathy:      Cervical: No cervical adenopathy.   Skin:     Coloration: Skin is not jaundiced.      Findings: Erythema and rash present.      Comments: Area of erythema along right axilla, as pictured below.   Neurological:      General: No focal deficit present.      Mental Status: She is alert.      Cranial Nerves: No cranial nerve deficit.      Sensory: No sensory deficit.      Motor: No weakness.      Coordination: Coordination normal.      Gait: Gait normal.      Deep Tendon Reflexes: Reflexes are normal and symmetric. Reflexes normal.   Psychiatric:         Mood and Affect: Mood normal.         Behavior: Behavior normal.         Thought Content: Thought content normal.         Judgment: Judgment normal.                  Assessment and Plan:  Alicia Sharp is a 63 y.o. female with history of breast cancer who presents today for 110-month follow-up concerning paresthesias and Pap smear. The following issues below were discussed. Patient instructed to return to  clinic in 3 months for follow-up regarding paresthesias.    Paresthesias  Numbess and Tingling  - history of numbness and tingling in her R foot prior to her diagnosis of breast cancer.   - Symptoms are constant, but states that they do not interfere with her life.   - reported numbness and tingling for ~1 year. Mostly localized to right foot.   - Patient no longer using capsaicin cream, as patient denies any pain.  - Offered gabapentin, however patient declined  Plan:  > Thiamine and folate replacement  > EMG studies ordered; may place neurology consult pending EMG results  > Hemoglobin A1c ordered    Right Axilla Rash  - likely atopic dermatitis versus radiation changes  Plan that patient:  > Advised discontinue using oils.  Prescription sent for triamcinolone.  Will follow-up at next clinic visit.  Patient instructed to call if no resolution.  > Patient to follow-up in clinic in 3 months and with radiation and medical oncology.  ???  Health Maintenance   Cervical Cancer Screening: last pap smear unknown. History of irregular pap smears.   - She reports history of abnormal pap smears, reported normal pap smear in 2007  - HIV Screening : declined  - Chlamydia, Gonorrhea Screening: declined  - Diabetes Screening: A1c ordered (ages 16-70 who are overweight or obese)  - Hepatitis B: declined (high-risk populations)  - Hepatitis C: declined (persons at high risk and those born between 1945-1965)  Plan:  > Pap smear performed 6/8  > Obtain colonoscopy results from [facility], Dr. Guss Bunde  > Shingles vaccine administered on 6/8  > Declined HIV testing, 2007 last period    Hyperlipidemia  - Calcium score 22 on 5/11; 4/21 total cholesterol 218, LDL 143  - coenzyme Q10 and fish oil, atorvastatin   - vitamin D3 and folic acid by her oncologist.     Allergic Rhinitis  - cetirizine    Pulmonary Nodules - CT scan 5/11 pulmonary nodules and post-surgical changes in her breast on her CT.  - Given her history of breast cancer, will route results to her medical, surgical, and radiation oncologists to see if they recommend any further workup.   Plan:  > Repeat CT scan in 3 months, results routed to her oncology team    Normocytic Anemia   - Hgb 22.8, MCV 94 on 4/32  Plan:  > repeat CBC, peripheral smear, and iron studies  > Continue B12 and folate replacement     Chronic Knee Pain  - associated with  numbness and tingling.   - history of arthritis in her knees.   - evaluated by orthopedic surgery in [place], [place] and was told that she would need a knee replacement in the near future.   - feels pain is under control currently with meloxicam and diclofenac gel.  Plan:  > orthopedic consult placed 4/13    Breast Cancer  - s/p lumpectomy and RT. She follows with Dr. Biagio Borg  - on anastrazole therapy.     Patient seen and discussed with Dr. Theresa Mulligan.    Denny Levy, M.D.  Internal Medicine PGY-1  Available on [employer]    Problem   Dermatitis   Paresthesias

## 2019-01-18 NOTE — Telephone Encounter
Spoke with Alicia Sharp regarding the inflammation in her axilla. She stated she saw a resident today at her PCP office that felt it was dermatitis and recommended using a steroid cream BID until her previously scheduled appointment with Montez Morita for this Friday. Pt denies any fever. Encouraged pt to call with any changes or any fever. Pt wishes to use cream as directed by resident and follow up with [facility] on Friday. Patient verbalizes understanding.  All questions answered. Patient given contact information for additional questions or concerns.

## 2019-01-19 ENCOUNTER — Encounter: Admit: 2019-01-19 | Discharge: 2019-01-19

## 2019-01-19 ENCOUNTER — Ambulatory Visit: Admit: 2019-01-18 | Discharge: 2019-01-19

## 2019-01-19 DIAGNOSIS — L309 Dermatitis, unspecified: Secondary | ICD-10-CM

## 2019-01-19 DIAGNOSIS — M1990 Unspecified osteoarthritis, unspecified site: Secondary | ICD-10-CM

## 2019-01-19 DIAGNOSIS — Z124 Encounter for screening for malignant neoplasm of cervix: Secondary | ICD-10-CM

## 2019-01-19 DIAGNOSIS — C50919 Malignant neoplasm of unspecified site of unspecified female breast: Secondary | ICD-10-CM

## 2019-01-19 NOTE — Progress Notes
I performed a history and brief physical examination of [name]  and discussed the management with Dr. Valerie Salts, MD at the time of the visit.  I agree with the evaluation and plan as documented by this resident, unless noted below, or in the resident's note in bold, blue italics.    I was present for and assisted with the pap smear today. I think her rash may be contact dermatitis and will have her hold her topical oils and try topical corticosteroid for 2 weeks. Agree with plan for nerve conduction study for neuropathy.         Encounter Diagnoses   Name Primary?    Health care maintenance Yes    Paresthesias     Pure hypercholesterolemia     Malignant neoplasm of female breast, unspecified estrogen receptor status, unspecified laterality, unspecified site of breast ([place])     Dermatitis         Orders Placed This Encounter    EMG PROCEDURE    ZOSTER VACCINE RECOM, ADJUVANT SUSPENSION COMPONENT (VIAL 1 OF 2)    ZOSTER VACCINE RECOMB, ADJ LYOPHILIZED COMPONENT (VIAL 2 OF 2)    POC HEMOGLOBIN A1C    CYTOLOGY PAP SMEAR SCREENING    triamcinolone acetonide (KENALOG) 0.025 % topical cream     Return in about 3 months (around 04/20/2019) for f/u numbness, Telehealth.    Patrcia Dolly, MD

## 2019-01-20 NOTE — Progress Notes
Notified patient via Mychart

## 2019-01-21 ENCOUNTER — Encounter: Admit: 2019-01-21 | Discharge: 2019-01-21

## 2019-01-21 DIAGNOSIS — Z853 Personal history of malignant neoplasm of breast: Secondary | ICD-10-CM

## 2019-01-21 DIAGNOSIS — Z9189 Other specified personal risk factors, not elsewhere classified: Secondary | ICD-10-CM

## 2019-01-21 DIAGNOSIS — I890 Lymphedema, not elsewhere classified: Secondary | ICD-10-CM

## 2019-01-21 DIAGNOSIS — Z170 Estrogen receptor positive status [ER+]: Secondary | ICD-10-CM

## 2019-01-21 DIAGNOSIS — Z79811 Long term (current) use of aromatase inhibitors: Secondary | ICD-10-CM

## 2019-01-21 DIAGNOSIS — B3789 Other sites of candidiasis: Secondary | ICD-10-CM

## 2019-01-21 DIAGNOSIS — Z923 Personal history of irradiation: Secondary | ICD-10-CM

## 2019-01-21 DIAGNOSIS — C50412 Malignant neoplasm of upper-outer quadrant of left female breast: Secondary | ICD-10-CM

## 2019-01-21 DIAGNOSIS — Z9889 Other specified postprocedural states: Secondary | ICD-10-CM

## 2019-01-21 NOTE — Progress Notes
Reviewed BIS testing results from today.  Results  L-Dex: 2.5  Baseline: 6.0  Change from Baseline: -3.5   WNL less than 3 standard deviation increase from baseline.      Notified patient viaMyChart result was normal and to continue with routine follow up as scheduled.  Provided clinic contact information for any questions or concerns.

## 2019-01-21 NOTE — Progress Notes
Name: Alicia Sharp          MRN: [id number]      DOB: 08-Nov-1955      AGE: 63 y.o.   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 returns to the clinic today for follow-up and evaluation of an area of erythema in her left axilla area. She has completed radiation therapy and has started Arimidex. She developed a red area of skin that is very itchy in the left axilla; she believes she first noticed it about 3 weeks ago. She started a steroid cream two days ago and has not noticed significant improvement. She denies any palpable masses on self-breast exam and she has no other complaints today.      Cancer Staging  Malignant neoplasm of upper-outer quadrant of left breast in female, estrogen receptor positive (HCC)  Staging form: Breast, AJCC 8th Edition  - Clinical: Stage IA (cT1c, cN0, cM0, G2, ER+, PR+, HER2-) - Signed by Carylon Perches, APRN on 08/25/2018  - Pathologic: Stage IA (pT1c, pN0(sn), cM0, G2, ER+, PR+, HER2-) - Signed by Carylon Perches, APRN on 08/25/2018      History of Present Illness  History: [name] is a female who presented to the [facility] on 07/16/2018 at age 57 for evaluation of her left breast cancer. Her screening mammograms in November 2019 showed a spiculated mss in the left breast and diagnosed mammogram redemonstrated the mass with suspicious calcifications. Ultrasound showed a 1.8 cm irregular mass corresponding with the mammogram finding and biopsy was recommended. Left breast biopsy 07/02/18 revealed a grade 2, IDC at 12:00. Left lumpectomy/SLNB 08/17/2018. Surgical pathology showed a 1.8 cm grade 2, IDC. There were 2 negative SLNs. She completed radiation therapy with Dr. Haskel Khan 10/12/2018 and she was started on Arimidex.     Breast Imaging:  Mammogram:   - Screening mammograms 06/30/18 [facility]) showed scattered fibroglandular tissue. There was large spiculated mass with surrounding architectural distortion at 12:00, 6-7 cm posterior to the nipple. No suspicious calcifications were seen.   - Left diagnostic mammogram 07/01/18 [facility]) redemonstrated a left breast mass without accompanying suspicious calcification.   - Bilateral diagnostic mammogram 07/17/18 ([place]) revealed no suspicious masses, calcifications or architectural distortion present to suggest malignancy within the right breast. The left breast was significant for an irregular spiculated high density mass within the upper left breast at 12:00, middle depth measuring at least 1.7 cm. A biopsy clip is seen along the anterior superior margin of the mass. There are no associated calcifications. This is consistent with biopsy-proven invasive ductal carcinoma. There is a posteriorly displaced ribbon-shaped biopsy clip within the upper left breast at 12:00, far posterior depth. This biopsy clip is 3.5 cm posterior to the malignant mass. There is no additional suspicious mammographic finding seen within the left breast.    Ultrasound:   - Left breast ultrasound 07/01/18 [facility]) showed a 1.8 cm irregular hypoechoic ovoid mass at 12:00, 3 cm FTN corresponding with the abnormality seen on mammogram. No suspicious lymphadenopathy was seen in the axilla. BIRADS 5.   - Left breast ultrasound 07/17/18 ([place]) revealed biopsy-proven malignant left breast mass at 12:00, 3 cm from the nipple measuring up to 2.0 cm. No morphologically suspicious left axillary lymph nodes. No additional  suspicious mammographic or sonographic finding within either breast.    Pertinent PMH: Negative  Family History: Maternal aunt had breast cancer, deceased at age 61    Reproductive health:  Age at Menarche:  89  Age at First Live Birth:  52  Age at Menopause:  1  Gravida: 3   Para: 2  Breastfeeding: 2 years    Medical Oncology: Dr. Biagio Borg  Present Therapy: Arimidex Referred by: Gardiner Barefoot, ARNP            Review of Systems  Constitutional: Negative for fever, chills, appetite change and fatigue.   HENT: Negative for hearing loss, congestion, rhinorrhea and tinnitus.    Eyes: Negative for pain, discharge and itching.   Respiratory: Negative for cough, chest tightness and shortness of breath.    Cardiovascular: Negative for chest pain and palpitations.   Gastrointestinal: Negative for abdominal distention, pain, nausea, vomiting, and diarrhea.   Genitourinary: Negative for frequency, vaginal bleeding, difficulty urinating and pelvic pain.   Musculoskeletal: Negative for myalgias, back pain, joint swelling and arthralgias.   Skin: Rash in the left axilla  Neurological: Negative for dizziness, weakness, light-headedness and headaches.   Hematological: Does not bruise/bleed easily.   Psychiatric/Behavioral: Negative for disturbed wake/sleep cycle. The patient is not nervous/anxious.    No Known Allergies  The following medical/surgical/family/social history and the list of medications are current, as of 01/21/2019    Medical History:   Diagnosis Date   ??? Arthritis    ??? Breast cancer (HCC) 07/2018    left breast     Surgical History:   Procedure Laterality Date   ??? LEFT RADIOACTIVE SEED LOCALIZED LUMPECTOMY Left 08/17/2018    Performed by Ignatius Specking, MD at [facility]   ??? IDENTIFICATION SENTINEL LYMPH NODE Left 08/17/2018    Performed by Ignatius Specking, MD at [facility]   ??? INJECTION RADIOACTIVE TRACER FOR SENTINEL NODE IDENTIFICATION Left 08/17/2018    Performed by Ignatius Specking, MD at [facility]   ??? LEFT SENTINEL LYMPH NODE BIOPSY Left 08/17/2018    Performed by Ignatius Specking, MD at [facility]   ??? HX ROTATOR CUFF REPAIR      R 08/2010. L 09/2015.   ??? HX WISDOM TEETH EXTRACTION     ??? KNEE ARTHROSCOPY Left     2014     Family History   Problem Relation Age of Onset   ??? Migraines Mother    ??? Heart Disease Father    ??? Stroke Father    ??? Coronary Artery Disease Father ??? Cancer-Breast Maternal Aunt         30s   ??? Cancer-Prostate Maternal Grandfather    ??? Migraines Maternal Grandfather      Social History     Socioeconomic History   ??? Marital status: Widowed     Spouse name: Not on file   ??? Number of children: 2   ??? Years of education: Not on file   ??? Highest education level: Not on file   Occupational History   ??? Occupation: retired   Tobacco Use   ??? Smoking status: Never Smoker   ??? Smokeless tobacco: Never Used   Substance and Sexual Activity   ??? Alcohol use: Yes     Alcohol/week: 5.0 standard drinks     Types: 5 Glasses of wine per week     Comment: 1 glass of wine/evening   ??? Drug use: Never   ??? Sexual activity: Not on  file   Other Topics Concern   ??? Not on file   Social History Narrative    Here with daughter Denny Peon     Objective:         ??? anastrozole (ARIMIDEX) 1 mg tablet Take one tablet by mouth daily.   ??? atorvastatin (LIPITOR) 40 mg tablet Take one tablet by mouth daily.   ??? capsaicin 0.025 % topical cream Apply to affected area for numbness daily as needed.   ??? cetirizine (ZYRTEC) 10 mg tablet Take 10 mg by mouth every morning.   ??? cholecalciferol (VITAMIN D-3) 1,000 units tablet    ??? coenzyme Q10 100 mg cap Take 100 mg by mouth daily.   ??? cyanocobalamin 1,000 mcg tablet Take one tablet by mouth daily.   ??? diclofenac (VOLTAREN) 1 % topical gel    ??? folic acid 400 mcg tablet Take 800 mcg by mouth daily.   ??? meloxicam (MOBIC) 15 mg tablet    ??? Omega-3-DHA-EPA-Fish Oil (FISH OIL) 1,000 mg (120 mg-180 mg) cap Take  by mouth daily.   ??? triamcinolone acetonide (KENALOG) 0.025 % topical cream Apply  topically to affected area twice daily.     Vitals:    01/21/19 1145 01/21/19 1147   BP:  (!) 148/58   BP Source:  Arm, Right Upper   Patient Position:  Sitting   Pulse:  69   Resp:  16   Temp:  36.6 ???C (97.8 ???F)   TempSrc:  Oral   SpO2:  100%   Weight:  85.4 kg (188 lb 3.2 oz)   Height:  161.3 cm (63.5)   PainSc: Zero      Body mass index is 32.82 kg/m???.     Pain Score: Zero Fatigue Scale: 0-None    Pain Addressed:  N/A    Patient Evaluated for a Clinical Trial: No treatment clinical trial available for this patient.     [place] Cooperative Oncology Group performance status is 0, Fully active, able to carry on all pre-disease performance without restriction.Marland Kitchen     Physical Exam  Vitals signs reviewed.   Chest:         Constitutional: No acute distress.  HEENT:  Head: Normocephalic and atraumatic.  Eyes: No discharge. No scleral icterus.  Pulmonary/Chest: No respiratory distress.   Lymphadenopathy: There is no axillary, infraclavicular, or supraclavicular adenopathy.  Neurological: Alert and oriented to person, place and time. No cranial nerve deficit.  Skin: Warm and dry. No rash noted. No erythema. No pallor.  Psychiatric: Normal mood and affect. Behavior is normal. Judgement and thought content normal.            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 Valentini is doing well. There are no signs of local or regional recurrence. She completed radiation therapy and has developed mild breast lymphedema. She met with the lymphedema nurse via telemedicine and has been wearing a compression bra and doing massage. She has a small area of erythema in there left axilla that is very itchy; there has been minimal improvement after use of a steroid cream for 2 days. On exam, the area is most consistent with mild topical candidiasis and I recommended also using an over the counter antifungal cream or powder. If the area does not resolve within a week of using the antifungal treatment, she should call  the clinic. She was agreeable with the plan. She is on Arimidex and continues to follow with Dr. Biagio Borg. We will plan to see her back in November or early December when her annual mammograms are due. We will continue to monitor for lymphedema with [facility] at the time of her returns visits. Ms. Erck was given ample time to have all of her questions answered.     PLAN:  Continue follow-up with Dr. Biagio Borg  Start topical antifungal cream or powder to left axilla; patient will call in 1 week if the rash has not resolved  RTC in November or early December to see Dr. Cyril Mourning with BIS and bilateral diagnostic mammograms    Carylon Perches, APRN

## 2019-01-21 NOTE — Progress Notes
Name: Alicia Sharp          MRN: [id number]      DOB: 07/21/1956      AGE: 63 y.o.   DATE OF SERVICE: 01/22/2019    Subjective:             Reason for Visit:  Heme/Onc Care      Cancer Staging  Malignant neoplasm of upper-outer quadrant of left breast in female, estrogen receptor positive (HCC)  Staging form: Breast, AJCC 8th Edition  - Clinical: Stage IA (cT1c, cN0, cM0, G2, ER+, PR+, HER2-) - Signed by Carylon Perches, APRN on 08/25/2018  - Pathologic: Stage IA (pT1c, pN0(sn), cM0, G2, ER+, PR+, HER2-) - Signed by Carylon Perches, APRN on 08/25/2018      Alicia Sharp is a 63 y.o. female who presented to the [facility] on 07/16/2018 at age 26 for evaluation of her left breast cancer. An abnormality was noted on screening mammogram. Left breast biopsy 07/02/18 revealed a grade 2, IDC at 12:00. S/p left lumpectomy/SLNB. She reports that she tolerated surgery generally well.  Completed adjuvant radiation.     Current Therapy:  Arimidex    History of Present Illness:  Alicia Sharp returns today s/p radiation. She is feeling well. She started the Arimidex after she completed radiation and reports that she is tolerating well. Denies arthralgias. Reports that she has a yeast infection in her axilla for which she is begin treated by Carylon Perches.        Review of Systems   Constitutional: Negative for activity change, appetite change, chills, fatigue, fever and unexpected weight change.   HENT: Negative for congestion, dental problem, mouth sores, sore throat, tinnitus and trouble swallowing.    Eyes: Negative for visual disturbance.   Respiratory: Negative for cough, chest tightness, shortness of breath and wheezing.    Cardiovascular: Negative for chest pain, palpitations and leg swelling.   Gastrointestinal: Negative for abdominal distention, abdominal pain, blood in stool, constipation, diarrhea, nausea and vomiting.   Genitourinary: Negative for difficulty urinating, dysuria, frequency and urgency. Musculoskeletal: Negative for arthralgias, back pain and joint swelling.   Skin: Negative for rash.   Neurological: Negative for dizziness, weakness, numbness and headaches.   Hematological: Negative for adenopathy. Does not bruise/bleed easily.   Psychiatric/Behavioral: Negative for sleep disturbance. The patient is not nervous/anxious.          Medical History:   Diagnosis Date   ??? Arthritis    ??? Breast cancer (HCC) 07/2018    left breast       Surgical History:   Procedure Laterality Date   ??? LEFT RADIOACTIVE SEED LOCALIZED LUMPECTOMY Left 08/17/2018    Performed by Ignatius Specking, MD at [facility]   ??? IDENTIFICATION SENTINEL LYMPH NODE Left 08/17/2018    Performed by Ignatius Specking, MD at [facility]   ??? INJECTION RADIOACTIVE TRACER FOR SENTINEL NODE IDENTIFICATION Left 08/17/2018    Performed by Ignatius Specking, MD at [facility]   ??? LEFT SENTINEL LYMPH NODE BIOPSY Left 08/17/2018    Performed by Ignatius Specking, MD at [facility]   ??? HX ROTATOR CUFF REPAIR      R 08/2010. L 09/2015.   ??? HX WISDOM TEETH EXTRACTION     ??? KNEE ARTHROSCOPY Left     2014        Family History   Problem Relation Age of Onset   ??? Migraines Mother    ??? Heart  Disease Father    ??? Stroke Father    ??? Coronary Artery Disease Father    ??? Cancer-Breast Maternal Aunt         30s   ??? Cancer-Prostate Maternal Grandfather    ??? Migraines Maternal Grandfather         Social History     Socioeconomic History   ??? Marital status: Widowed     Spouse name: Not on file   ??? Number of children: 2   ??? Years of education: Not on file   ??? Highest education level: Not on file   Occupational History   ??? Occupation: retired   Tobacco Use   ??? Smoking status: Never Smoker   ??? Smokeless tobacco: Never Used   Substance and Sexual Activity   ??? Alcohol use: Yes     Alcohol/week: 5.0 standard drinks     Types: 5 Glasses of wine per week     Comment: 1 glass of wine/evening   ??? Drug use: Never   ??? Sexual activity: Not on file   Other Topics Concern   ??? Not on file Social History Narrative    Here with daughter Alicia Sharp          Objective:     No Known Allergies        ??? anastrozole (ARIMIDEX) 1 mg tablet Take one tablet by mouth daily.   ??? atorvastatin (LIPITOR) 40 mg tablet Take one tablet by mouth daily.   ??? capsaicin 0.025 % topical cream Apply to affected area for numbness daily as needed.   ??? cetirizine (ZYRTEC) 10 mg tablet Take 10 mg by mouth every morning.   ??? cholecalciferol (VITAMIN D-3) 1,000 units tablet    ??? coenzyme Q10 100 mg cap Take 100 mg by mouth daily.   ??? cyanocobalamin 1,000 mcg tablet Take one tablet by mouth daily.   ??? diclofenac (VOLTAREN) 1 % topical gel    ??? folic acid 400 mcg tablet Take 800 mcg by mouth daily.   ??? meloxicam (MOBIC) 15 mg tablet    ??? Omega-3-DHA-EPA-Fish Oil (FISH OIL) 1,000 mg (120 mg-180 mg) cap Take  by mouth daily.   ??? triamcinolone acetonide (KENALOG) 0.025 % topical cream Apply  topically to affected area twice daily.       Vitals:    01/22/19 0824 01/22/19 0825   BP: 134/57    BP Source: Arm, Right Upper    Patient Position: Sitting    Pulse: 75    Resp: 16    Temp: 36.2 ???C (97.1 ???F)    TempSrc: Temporal    SpO2: 99%    Weight: 85.9 kg (189 lb 6.4 oz)    Height: 161.3 cm (63.5)    PainSc:  Zero     Body mass index is 33.02 kg/m???.     Pain Score: Zero       Fatigue Scale: 0-None    Pain Addressed:  N/A    Patient Evaluated for a Clinical Trial: No treatment clinical trial available for this patient.     [place] Cooperative Oncology Group performance status is 0, Fully active, able to carry on all pre-disease performance without restriction.Marland Kitchen     Physical Exam  Constitutional:       General: She is not in acute distress.     Appearance: Normal appearance. She is well-developed.   HENT:      Head: Normocephalic and atraumatic.      Mouth/Throat:  Mouth: No oral lesions.   Eyes:      General: No scleral icterus.     Conjunctiva/sclera: Conjunctivae normal.      Pupils: Pupils are equal, round, and reactive to light.   Neck: Musculoskeletal: Normal range of motion.   Pulmonary:      Effort: Pulmonary effort is normal.      Breath sounds: Normal breath sounds. No wheezing, rhonchi or rales.   Chest:      Breasts: Breasts are symmetrical.         Right: No inverted nipple, mass, nipple discharge or skin change.         Left: No inverted nipple, mass, nipple discharge or skin change.       Abdominal:      General: Bowel sounds are normal. There is no distension.      Palpations: Abdomen is soft. There is no mass.      Tenderness: There is no abdominal tenderness. There is no guarding.   Musculoskeletal: Normal range of motion.   Lymphadenopathy:      Cervical: No cervical adenopathy.      Upper Body:      Right upper body: No supraclavicular or axillary adenopathy.      Left upper body: No supraclavicular or axillary adenopathy.   Skin:     General: Skin is warm and dry.      Findings: No rash.   Neurological:      Mental Status: She is alert and oriented to person, place, and time.      Cranial Nerves: No cranial nerve deficit.   Psychiatric:         Behavior: Behavior normal.         Thought Content: Thought content normal.         Judgment: Judgment normal.            PATHOLOGY:  Final Diagnosis:     A. Breast, left radioactive seed lumpectomy, 1 clip, 1 seed, lumpectomy:   Invasive ductal carcinoma, histologic grade 2; margins negative.   See synoptic report below.     B. Breast, left additional superior margin, excision:   Negative for malignancy.     C. Breast, left additional lateral margin, excision:   Negative for malignancy. ???     D. Breast, left additional anterior margin, excision:   Negative for malignancy. ???     E. Adipose tissue, left additional posterior margin, excision:   Negative for malignancy. ???     F. Adipose tissue, left additional medial margin, excision:   Negative for malignancy. ???     G. Breast, left additional inferior margin, excision:   Negative for malignancy. ??? H. Lymph nodes (2), left axillary sentinel lymph nodes, dissection:   There is no evidence of malignancy in two lymph nodes. ???(0/2)   Deeper sections and a pancytokeratin immunostain are negative in   support of the above diagnosis. ???       Comment:   INVASIVE CARCINOMA OF THE BREAST: RESECTION   CAP Version: Invasive Breast 4.3.0.1   Procedure: ???Lumpectomy   Laterality: Left ???   Tumor Site: ???12:00, 3 cm FTN, per clinical note   Histologic Type: Invasive ductal carcinoma, with focal cribriform features   Size of Invasive Component: ???1.8 x 1.6 x 1.2 cm     Surgical Margins: ???   Lumpectomy   Main lumpectomy specimen: Margins negative   Invasive carcinoma: The closest margin is <1 mm from medial margin  and 1mm from the posterior margin. All remaining margins are greater than   2 mm.   DCIS: The closest margin is 6 mm from posterior margin. All   remaining margins are greater than 2 mm.     Final shave margins:   All margins are negative for invasive and in-situ carcinoma.   ??? ??? ??? ??? ???   Histologic Grade (Nottingham Histologic Score): II / III   Tubule Formation: 3   Nuclear Grade: 2   Mitotic Count (40x objective): 1   Total Nottingham Score: ???6/9   Ductal Carcinoma In-situ (DCIS): ???Present, scant foci of DCIS, low grade   Extensive intraductal component (>25%): Absent   DCIS within and/or adjacent to invasive carcinoma: Yes   DCIS separate from invasive carcinoma: No ???   Lobular Carcinoma In-situ (LCIS): Absent   Lymph-Vascular Invasion: ???No   Nipple Involvement: Not Applicable   Skin Involvement: Not applicable   Skeletal Muscle: Not present   Lymph Node Sampling:   Sentinel lymph node(s) only   Total number of lymph nodes examined: 2   Number of lymph nodes with macrometastases (>2 mm): 0   Number of lymph nodes with micrometastases (>0.2 mm to 2 mm): 0   Number of lymph nodes with isolated tumor cells (<0.2 mm and   <200 cells): ???0   Size of largest metastatic deposit (millimeters): N/a Extranodal extension: N/a   Prognostic markers: Ordered on block A11; the results will be issued as an   addendum.   Time between tumor removal and placement into formalin < 1 hour: ???Yes   Fixation Time between 6-72 hours: ???Yes     Pathologic Staging (pTNM, AJCC 8th edition): ???   pT1c ???snN0 ??? Mn/a     Primary Tumor (Invasive Carcinoma) (pT)   pT1c: ??? ??? Tumor >10 mm but d20 mm in greatest dimension     Regional Lymph Nodes (pN)   (sn): ??? ??? Only sentinel node(s) evaluated. ???If 6 or more sentinel   nodes and/or nonsentinel nodes are removed, this modifier should not be   used.   pN0: ??? ??? No regional lymph node metastasis identified or ITCs only     Distant Metastasis (M) ???   Not applicable     The pathologic stage assigned here should be regarded as provisional, as   it reflects only current pathologic data and does not incorporate full   knowledge of the patient's clinical status and/or prior pathology.     Block for Biomarker Testing: A11       Recent Results (from the past 672 hour(s))   POC HEMOGLOBIN A1C    Collection Time: 01/18/19 12:00 AM   Result Value Ref Range    Poc Hemoglobin A1C 5.3 4 - 6 %   CYTOLOGY PAP SMEAR SCREENING    Collection Time: 01/18/19 12:00 AM   Result Value Ref Range    Cytology       [facility]  [email or web address]    Department of Pathology and Laboratory Medicine  [address], [place], [place] [address].  Office:  [phone] Fax:  [phone]    CYTOLOGY REPORT    NAME: Alicia Sharp, Alicia Sharp CYTOLOGY #: [id number] MR #: [id number] ALT ID #:   BILLING #: [id number]   LOCATION: [facility] DATE OF PROCEDURE: 01/18/2019  00:00 AGE:  62 SEX: F DATE RECEIVED: 01/19/2019 DOB: [birth year] TIME  RECEIVED: 11:26 PHYSICIAN: NICOLE B BALMACEDA DATE OF REPORT: 01/20/2019  COPY TO:  DATE  OF PRINTING: 01/20/2019     HISTORY:  Date of Last Menstrual Period: None Given Menstrual History:  Post-menopausal Contraceptive History: None Cancer History: Breast Infection History: None Given Treatment History: None Other Clinical  Conditions: None Given     MATERIAL RECEIVED:  A: Thin Prep Pap Test-Cervical /Vaginal Cytologic Material            ########################################################################  Final Diagnosis:    A.  Thin Prep Pap Test-Cervical /Vagina l Cytologic Material:  Satisfactory for evaluation.  Negative for intraepithelial lesion or malignancy.  Atrophy.  This specimen was sent for HPV testing.  See comment for HPV testing  results.        Comment  HPV, High Risk: Negative    HOLOGIC Gen-Probe APTIMA HR-HPV Assay is a FDA approved in vitro nucleic  acid amplification test for the qualitative detection of E6/E7 viral  messenger RNA (mRNA) from 14 high-risk types of human papillomavirus (HPV)  in cervical specimens.    The high-risk HPV types detected by the assay include: 16, 18, 31, 33, 35,  39, 45, 51, 52, 56, 58, 59, 66, and 68.  The assay has been validated by  the Department of Pathology and Laboratory Medicine at [facility] using [facility] Gen-Probe Panther System.      The primary screening of this case is performed at the [facility], [address], [address], [place], [place]  [address].         Attestation:  By this signature, I attest that I have personally formul ated the final  interpretation expressed in this report and that the above diagnosis is  based upon my examination of the slides and/or other material indicated in  this report.            clk/01/20/2019                                                               +++Electronically Signed Out By Arline Asp,  CT(ASCP)+++     Cervical cytology is a SCREENING TEST primarily for detecting cancers and  precancerous lesions.  This screening test has a well documented false  negative rate.  Your patient's pap test results should be interpreted in  conjunction with history and clinical findings.  Reported using [facility] terminology.  ########################################################################        09/04/2018: BONE DENSITY:    IMPRESSION      Normal bone mineral density analysis.     Assessment and Plan:  The patient is a 63 y.o. female with the following:    1.  Left grade 2, IDC (ER91-100%, PR91-100%, Her2 0+), at 12:00, dx 06/2018.  Clinical Stage IA; cT2 N0 M0.  She is now s/p lumpectomy and SLNB; pT1c N0 M0.     Alicia Cloud, MD    ATTESTATION  I personally interviewed and examined the patient. I have reviewed the history, physical, assessment and plan outlined by the nurse practitioner.      The patient presents with (HPI) breast cancer.       Exam is notable for:  Gen: Alert and oriented, NAD  Eyes: Pupils equal, no scleral icterus  ENT: Oropharynx clear  CV: S1/S2 no murmur  Pulm: no respiratory distress, CTAB  Breast: As noted above  Abd: Soft, NT ND + BS  Neuro: CN II-XII grossly intact, no focal deficits  Derm: No visible skin rashes  Lymph: No cervical, supraclavicular or axillary LAD palpated    My impression is:  63 y.o. yo female with:  1.  Left grade 2, IDC (ER91-100%, PR91-100%, Her2 0+), at 12:00, dx 06/2018.  Clinical Stage IA; cT2 N0 M0.  She is now s/p lumpectomy and SLNB; pT1c N0 M0.     My Plan is:    Alicia Sharp is without evidence of disease at this time.     Continue Arimidex.     Continue calcium and vitamin D supplements as well as weight bearing exercise (such as walking and light strength training) for bone health. Recommended 30 minutes of exercise most days of the week. Baseline DEXA WNL. Next DEXA due 08/2020.     Alicia Sharp was encouraged to call with any persistent symptoms, questions, or concerns.    RTC in 3 months for EOT visit with NP.

## 2019-01-21 NOTE — Progress Notes
Bioimpedance Spectroscopy performed.  Advised patient that Normal result will be sent within 24 hours by mail or via Mychart (preferred).  The patient will be contacted via phone by the lymphedema nurse with any abnormal results.

## 2019-01-22 ENCOUNTER — Encounter: Admit: 2019-01-22 | Discharge: 2019-01-22

## 2019-01-22 DIAGNOSIS — M1990 Unspecified osteoarthritis, unspecified site: Secondary | ICD-10-CM

## 2019-01-22 DIAGNOSIS — Z170 Estrogen receptor positive status [ER+]: Secondary | ICD-10-CM

## 2019-01-22 DIAGNOSIS — C50919 Malignant neoplasm of unspecified site of unspecified female breast: Secondary | ICD-10-CM

## 2019-01-22 DIAGNOSIS — C50412 Malignant neoplasm of upper-outer quadrant of left female breast: Principal | ICD-10-CM

## 2019-01-25 ENCOUNTER — Encounter: Admit: 2019-01-25 | Discharge: 2019-01-25

## 2019-01-25 DIAGNOSIS — M799 Soft tissue disorder, unspecified: Principal | ICD-10-CM

## 2019-01-26 ENCOUNTER — Ambulatory Visit: Admit: 2019-01-26 | Discharge: 2019-02-09

## 2019-02-01 ENCOUNTER — Encounter: Admit: 2019-02-01 | Discharge: 2019-02-01

## 2019-02-01 ENCOUNTER — Encounter: Admit: 2019-01-25 | Discharge: 2019-01-26

## 2019-02-01 ENCOUNTER — Encounter: Admit: 2019-02-01 | Discharge: 2019-02-02

## 2019-02-01 DIAGNOSIS — M25511 Pain in right shoulder: Secondary | ICD-10-CM

## 2019-02-01 DIAGNOSIS — M799 Soft tissue disorder, unspecified: Principal | ICD-10-CM

## 2019-02-08 ENCOUNTER — Encounter: Admit: 2019-02-08 | Discharge: 2019-02-08

## 2019-02-08 DIAGNOSIS — C50919 Malignant neoplasm of unspecified site of unspecified female breast: Secondary | ICD-10-CM

## 2019-02-08 DIAGNOSIS — M25511 Pain in right shoulder: Secondary | ICD-10-CM

## 2019-02-08 DIAGNOSIS — R202 Paresthesia of skin: Principal | ICD-10-CM

## 2019-02-08 DIAGNOSIS — M1990 Unspecified osteoarthritis, unspecified site: Secondary | ICD-10-CM

## 2019-02-08 DIAGNOSIS — Z9189 Other specified personal risk factors, not elsewhere classified: Secondary | ICD-10-CM

## 2019-02-08 DIAGNOSIS — M25512 Pain in left shoulder: Secondary | ICD-10-CM

## 2019-02-08 NOTE — Procedures
Electrodiagnostic Laboratory Report    Impression:  NORMAL    1. There is no electrodiagnostic evidence of a right medial or lateral plantar mononeuropathy.  2. There is no evidence of a right sciatic, tibial or fibular mononeuropathy.  3. There is no evidence of a right L2-S1 radiculopathy.  4. There is no evidence of peripheral polyneuropathy.     Clinical Correlation:  Follow-up with referring physician.  History, physical exam findings may be consistent with a distal focal entrapment of the medial plantar nerve or a Morton's neuroma.  May consider a referral to a Podiatrist in the future.    ATTESTATION    I was present during the entire procedure performed by a resident Dr. Catalina Antigua.  Dr. Sherley Bounds assisted me in the history, physical exam, study design and interpretation of results.      Staff name:  Wilhelmina Mcardle, MD Date:  02/08/2019         Thank you for allowing me to perform electrodiagnostic testing on your patients.  A full EMG/NCS report will be scanned into [facility], including waveforms.  If you have any further questions or comments, please do not hesitate to call.    Wilhelmina Mcardle, MD  Diplomate, American Board of Physical Medicine and Rehabilitation  Diplomate, American Association of Neuromuscular and Electrodiagnostic Medicine

## 2019-02-09 ENCOUNTER — Ambulatory Visit: Admit: 2019-02-08 | Discharge: 2019-02-09

## 2019-02-09 ENCOUNTER — Encounter: Admit: 2019-02-09 | Discharge: 2019-02-09

## 2019-02-09 DIAGNOSIS — Z9189 Other specified personal risk factors, not elsewhere classified: Secondary | ICD-10-CM

## 2019-02-10 ENCOUNTER — Ambulatory Visit: Admit: 2019-02-10 | Discharge: 2019-02-24

## 2019-02-10 ENCOUNTER — Encounter: Admit: 2019-02-10 | Discharge: 2019-02-10

## 2019-02-10 DIAGNOSIS — G5761 Lesion of plantar nerve, right lower limb: Secondary | ICD-10-CM

## 2019-02-10 DIAGNOSIS — R202 Paresthesia of skin: Secondary | ICD-10-CM

## 2019-02-11 ENCOUNTER — Encounter: Admit: 2019-02-11 | Discharge: 2019-02-11

## 2019-02-11 ENCOUNTER — Ambulatory Visit: Admit: 2019-02-11 | Discharge: 2019-02-11

## 2019-02-11 DIAGNOSIS — C50919 Malignant neoplasm of unspecified site of unspecified female breast: Secondary | ICD-10-CM

## 2019-02-11 DIAGNOSIS — M1990 Unspecified osteoarthritis, unspecified site: Secondary | ICD-10-CM

## 2019-02-11 DIAGNOSIS — C50412 Malignant neoplasm of upper-outer quadrant of left female breast: Secondary | ICD-10-CM

## 2019-02-11 NOTE — Progress Notes
Radiation Oncology Follow-Up Visit via ZOOM        Obtained patient's verbal consent to treat them and their agreement to [facility] financial policy and NPP via this telehealth visit during the [facility] Emergency.    Date: 02/11/2019       Alicia Sharp is a 63 y.o. female.     There were no encounter diagnoses.  Staging: Cancer Staging  Malignant neoplasm of upper-outer quadrant of left breast in female, estrogen receptor positive (HCC)  Staging form: Breast, AJCC 8th Edition  - Clinical: Stage IA (cT1c, cN0, cM0, G2, ER+, PR+, HER2-) - Signed by Carylon Perches, APRN on 08/25/2018  - Pathologic: Stage IA (pT1c, pN0(sn), cM0, G2, ER+, PR+, HER2-) - Signed by Carylon Perches, APRN on 08/25/2018        Subjective:          Cancer Staging  Malignant neoplasm of upper-outer quadrant of left breast in female, estrogen receptor positive (HCC)  Staging form: Breast, AJCC 8th Edition  - Clinical: Stage IA (cT1c, cN0, cM0, G2, ER+, PR+, HER2-) - Signed by Carylon Perches, APRN on 08/25/2018  - Pathologic: Stage IA (pT1c, pN0(sn), cM0, G2, ER+, PR+, HER2-) - Signed by Carylon Perches, APRN on 08/25/2018        History of Present Illness    Diagnosis:   63 year old woman with screen-detected, grade 2, ER91-100%, PR91-100%, Her2 0+ invasive ductal carcinoma of left breast at the 12:00 position (cT2N0M0) status post lumpectomy and sentinel lymph node biopsy on 08/17/18.  Final pathology revealed 1.8 cm tumor with positive DCIS, negative EIC, negative LVSI and resected with margins negative for invasive ductal carcinoma and greater than 2 mm from DCIS.  0 out of 2 sentinel lymph nodes were involved. Staged as pT1c pN0 (sn). She was referred for adjuvant radiotherapy.    Treatment History:   Patient underwent adjuvant radiotherapy to her left breast to 40.05 Gy in 15 fractions followed by a boost of 10 Gy in 5 fractions using 3D conformal radiation treatment planning during 09/21/18 - 10/16/18. She tolerated treatment well and had no treatment interruptions. As expected, she had soreness in her areola and erythema but no pruritus or desquamation at the end of her treatment. She was prescribed SSD cream.     Interval History:   Patient returns today for routine follow up. She is doing well. She developed a fungal infection in her left axilla but has been improving with application of antifungal topical. He daughter is a PA and has been helping her with this.  She complains of the sensation of slight fullness of her left breast and tightness of her skin. Otherwise, she is doing well and denies residual fatigue, skin irritation, dyspnea, fevers, cough, unintentional weight loss, new headaches and new bone pain. Under Dr. Earnest Rosier direction, she is taking Arimidex and is tolerating it without difficulties. She continues to follow with Dr. Robyn Haber office and will next be seen in November or early December with bilateral mammograms.          Review of Systems  Constitutional: Negative.   HENT: Negative.   Eyes: Negative.   Respiratory: Negative.   Cardiovascular: Negative.   Gastrointestinal: Negative.   Endocrine: Negative.   Genitourinary: Negative.   Musculoskeletal: Negative.   Skin: Positive for color change (red left axilla).   Allergic/Immunologic: Negative.   Neurological: Negative.   Hematological: Negative.   Psychiatric/Behavioral: Negative.       Objective:         ???  anastrozole (ARIMIDEX) 1 mg tablet Take one tablet by mouth daily.   ??? atorvastatin (LIPITOR) 40 mg tablet Take one tablet by mouth daily.   ??? capsaicin 0.025 % topical cream Apply to affected area for numbness daily as needed.   ??? cetirizine (ZYRTEC) 10 mg tablet Take 10 mg by mouth every morning.   ??? cholecalciferol (VITAMIN D-3) 1,000 units tablet    ??? coenzyme Q10 100 mg cap Take 100 mg by mouth daily.   ??? cyanocobalamin 1,000 mcg tablet Take one tablet by mouth daily.   ??? diclofenac (VOLTAREN) 1 % topical gel ??? folic acid 400 mcg tablet Take 800 mcg by mouth daily.   ??? meloxicam (MOBIC) 15 mg tablet    ??? Omega-3-DHA-EPA-Fish Oil (FISH OIL) 1,000 mg (120 mg-180 mg) cap Take  by mouth daily.   ??? triamcinolone acetonide (KENALOG) 0.025 % topical cream Apply  topically to affected area twice daily.               KARNOFSKY PERFORMANCE SCORE:  90% Able to carry on normal activity; minor signs of disease     Physical Exam     Vitals:    02/11/19 0945   PainSc: Zero     .vitals  GENERAL: Well-appearing Caucasian female in no acute distress.   HEENT: Atraumatic, normocephalic. Normal conjunctivae.   CARDIOVASCULAR: No peripheral edema.  PULMONARY: No use of accessory muscles to breathe.  BREASTS: Well-healed surgical incision along her left breast. No residual erythema or desquamation. No visible nodularities. Patient herself does not palpate anything abnormal in her left breast. No erythema/rash in her left axilla.   ABDOMEN: Nondistended.   EXTREMITIES: No cyanosis or clubbing. No lymphedema in left upper extremity.   NEUROLOGIC: Alert and oriented x3. No gross motor deficits.   SKIN: No pallor or jaundice.  PSYCHIATRIC: Normal affect. Speech and behavior are appropriate. Judgment and insight are intact.           Assessment and Plan:  63 year old woman with screen-detected, grade 2, ER91-100%, PR91-100%, Her2 0+ invasive ductal carcinoma of left breast at the 12:00 position (cT2N0M0) status post lumpectomy and sentinel lymph node biopsy on 08/17/18.  Final pathology revealed 1.8 cm tumor with positive DCIS, negative EIC, negative LVSI and resected with margins negative for invasive ductal carcinoma and greater than 2 mm from DCIS.  0 out of 2 sentinel lymph nodes were involved. Staged as pT1c pN0 (sn). Patient underwent adjuvant radiotherapy to her left breast to 40.05 Gy in 15 fractions followed by a boost of 10 Gy in 5 fractions using 3D conformal radiation treatment planning during 09/21/18 - 10/16/18. Patient is doing very well and has no subacute toxicities from having undergone adjuvant radiotherapy. She has no signs of recurrence. I will see her in 6 months for her next follow up. I have encouraged her to call with interval questions/concerns.     Visit Start Time 1005AM Visit End Time 1022AM.  Today's visit was prolonged due to additional time spent preparing (10 minutes).     Quality Measures Tracking    Late Radiation Toxicity:  Worst CTCAE toxicity grade in interval since last follow-up:    Grade 0: No Toxicity     Recurrence Status:   No evidence of recurrence

## 2019-02-15 ENCOUNTER — Encounter: Admit: 2019-02-15 | Discharge: 2019-02-15

## 2019-02-17 ENCOUNTER — Encounter: Admit: 2019-02-17 | Discharge: 2019-02-17

## 2019-02-23 ENCOUNTER — Ambulatory Visit: Admit: 2019-04-07 | Discharge: 2019-04-08

## 2019-02-23 ENCOUNTER — Encounter: Admit: 2019-02-23 | Discharge: 2019-02-23

## 2019-02-23 DIAGNOSIS — Z1211 Encounter for screening for malignant neoplasm of colon: Secondary | ICD-10-CM

## 2019-03-01 ENCOUNTER — Encounter: Admit: 2019-03-01 | Discharge: 2019-03-01

## 2019-03-01 DIAGNOSIS — M79671 Pain in right foot: Secondary | ICD-10-CM

## 2019-03-01 DIAGNOSIS — M799 Soft tissue disorder, unspecified: Principal | ICD-10-CM

## 2019-03-01 DIAGNOSIS — M25512 Pain in left shoulder: Secondary | ICD-10-CM

## 2019-03-01 NOTE — Progress Notes
Date of Service: 03/02/2019      Chief Complaint:  Right foot tingling        History of Present Illness  Alicia Sharp is present for numbness and tingling to the plantar aspect of her right toes which has been present for about 1 year. She saw her PCP in November for this issue. She has no pain associated with this. No numbness to the dorsal aspect of her foot. Her symptoms do not seem to worsen with activity.       There were no vitals taken for this visit.    Review of Systems     Social History     Occupational History   ??? Occupation: retired   Tobacco Use   ??? Smoking status: Never Smoker   ??? Smokeless tobacco: Never Used   Substance and Sexual Activity   ??? Alcohol use: Yes     Alcohol/week: 5.0 standard drinks     Types: 5 Glasses of wine per week     Comment: 1 glass of wine/evening   ??? Drug use: Never   ??? Sexual activity: Not on file        Surgical History:   Procedure Laterality Date   ??? LEFT RADIOACTIVE SEED LOCALIZED LUMPECTOMY Left 08/17/2018    Performed by Ignatius Specking, MD at [facility]   ??? IDENTIFICATION SENTINEL LYMPH NODE Left 08/17/2018    Performed by [facility]ius [facility]ing, [facility] at [facility]   ??? INJECTION RADIOACTIVE TRACER FOR SENTINEL NODE IDENTIFICATION Left 08/17/2018    Performed by [facility]ing, [facility] at [facility]   ??? LEFT SENTINEL LYMPH NODE BIOPSY Left 08/17/2018    Performed by [facility]ius [facility]ing, [facility] at [facility]   ??? HX ROTATOR CUFF REPAIR      R 08/2010. L 09/2015.   ??? HX WISDOM TEETH EXTRACTION     ??? KNEE ARTHROSCOPY Left     2014       No Known Allergies     Current Outpatient Medications on File Prior to Visit   Medication Sig Dispense Refill   ??? anastrozole (ARIMIDEX) 1 mg tablet Take one tablet by mouth daily. 90 tablet 3   ??? atorvastatin (LIPITOR) 40 mg tablet Take one tablet by mouth daily. 90 tablet 3   ??? capsaicin 0.025 % topical cream Apply to affected area for numbness daily as needed. 50 g 3   ??? cetirizine (ZYRTEC) 10 mg tablet Take 10 mg by mouth every morning. ??? cholecalciferol (VITAMIN D-3) 1,000 units tablet      ??? coenzyme Q10 100 mg cap Take 100 mg by mouth daily.     ??? cyanocobalamin 1,000 mcg tablet Take one tablet by mouth daily. 90 tablet 3   ??? diclofenac (VOLTAREN) 1 % topical gel      ??? folic acid 400 mcg tablet Take 800 mcg by mouth daily.     ??? meloxicam (MOBIC) 15 mg tablet      ??? Omega-3-DHA-EPA-Fish Oil (FISH OIL) 1,000 mg (120 mg-180 mg) cap Take  by mouth daily.     ??? triamcinolone acetonide (KENALOG) 0.025 % topical cream Apply  topically to affected area twice daily. 15 g 1     No current facility-administered medications on file prior to visit.           Physical Exam  HENT:   Head: Normocephalic and atraumatic.   Eyes: Conjunctivae and EOM are normal.   Cardiovascular: Normal rate and intact  distal pulses.   Pulmonary/Chest: Effort normal. No respiratory distress.   Neurological:  Alert and oriented to person, place, and time.   Skin: Skin is warm and dry.   Psychiatric: Jearlene Smuck [facility] appears well, normal mood and affect. Behavior is normal. Judgment and thought content normal. No acute distress.      Ortho Exam  Today I examined the right lower extremity  She expresses tingling to the plantar aspect of toes 2-3. No significant discomfort to palpation. No dorsal numbness. ROM of the ankle and foot is intact. Strength intact. Pulses palpable.       Imaging  Radiographs obtained in the office today include 3 views of the right foot which show no degenerative changes, joint spaces well maintained. No osseous abnormailitiles, no fractures.     Imaging was reviewed with the patient.       Assessment and Plan    [name] is present for tingling and occasional numbness between toes 2-3. She's had an EMG in the past which was WNL. I suspect this is local irritation or compression of the nerve resulting a Morton's neuroma. She has no discomfort associated with this. She was advised that a surgical excision of a neuroma or release is not indicated at this time since she has no discomfort. Conservative treatment options were discussed which include injections, shoe wear modification or activity modification. She would like to proceed with a corticosteroid injection of 1 1/2 cc ropivacaine and 1cc Celestone today. The patient was instructed to monitor signs of infection which include drainage, erythema, warmth and increased pain.     The majority (>50%) of the 30 minute encounter was dedicated to counseling and coordination of care including: discussion of imaging results, prognosis, risk and benefit of treatment options and patient education.       I attest that I, Rebekah Kasputis, ATC, have taken down these notes as a scribe in the presence of the dictating physician, Ree Shay, MD.  03/02/2019 3:48 PM    I attest that I, Ree Shay, MD, personally interviewed, examined the patient and performed the services documented above by the department scribe.

## 2019-03-02 ENCOUNTER — Encounter: Admit: 2019-03-02 | Discharge: 2019-03-02

## 2019-03-02 ENCOUNTER — Ambulatory Visit: Admit: 2019-03-02 | Discharge: 2019-03-02

## 2019-03-02 DIAGNOSIS — G5761 Lesion of plantar nerve, right lower limb: Secondary | ICD-10-CM

## 2019-03-02 DIAGNOSIS — M1990 Unspecified osteoarthritis, unspecified site: Secondary | ICD-10-CM

## 2019-03-02 DIAGNOSIS — M79671 Pain in right foot: Secondary | ICD-10-CM

## 2019-03-02 DIAGNOSIS — R202 Paresthesia of skin: Secondary | ICD-10-CM

## 2019-03-02 DIAGNOSIS — C50919 Malignant neoplasm of unspecified site of unspecified female breast: Secondary | ICD-10-CM

## 2019-03-02 NOTE — Procedures
Trigger Point Injection    Consent:   Consent obtained: verbal  Consent given by: patient  Alternatives discussed: alternative treatment, delayed treatment and no treatment  Discussed with patient the purpose of the treatment/procedure, other ways of treating my condition, including no treatment/ procedure and the risks and benefits of the alternatives. Patient has decided to proceed with treatment/procedure.        Universal Protocol:  Relevant documents: relevant documents present and verified  Test results: test results available and properly labeled  Imaging studies: imaging studies available  Required items: required blood products, implants, devices, and special equipment available  Site marked: the operative site was marked  Patient identity confirmed: Patient identify confirmed verbally with patient.        Time out: Immediately prior to procedure a "time out" was called to verify the correct patient, procedure, equipment, support staff and site/side marked as required      Procedures Details:   Indications: painPrep: alcohol  Local anesthetic: ethyl chloride spray  Needle size: 25 G  Number of muscles: 1 or 2  Approach: dorsal.  Medications administered: 6 mg betamethasone acetate 6 mg/mL; 1 mL ropivacaine (PF) 0.5% (5 mg/mL)  Comments: Patient tolerated the procedure well with no immediate complications.

## 2019-03-04 MED ORDER — BETAMETHASONE ACET,SOD PHOS 6 MG/ML IJ SUSP
6 mg | Freq: Once | INTRAMUSCULAR | 0 refills | Status: CP | PRN
Start: 2019-03-04 — End: ?
  Administered 2019-03-02: 21:00:00 6 mg via INTRAMUSCULAR

## 2019-03-04 MED ORDER — ROPIVACAINE (PF) 5 MG/ML (0.5 %) IJ SOLN
1 mL | Freq: Once | INTRAMUSCULAR | 0 refills | Status: CP | PRN
Start: 2019-03-04 — End: ?
  Administered 2019-03-02: 21:00:00 1 mL via INTRAMUSCULAR

## 2019-03-12 ENCOUNTER — Encounter: Admit: 2019-03-01 | Discharge: 2019-03-12

## 2019-03-30 ENCOUNTER — Encounter: Admit: 2019-03-30 | Discharge: 2019-03-30 | Payer: No Typology Code available for payment source

## 2019-03-30 DIAGNOSIS — E785 Hyperlipidemia, unspecified: Secondary | ICD-10-CM

## 2019-03-30 DIAGNOSIS — J302 Other seasonal allergic rhinitis: Secondary | ICD-10-CM

## 2019-03-30 DIAGNOSIS — M1990 Unspecified osteoarthritis, unspecified site: Secondary | ICD-10-CM

## 2019-03-30 DIAGNOSIS — C50919 Malignant neoplasm of unspecified site of unspecified female breast: Secondary | ICD-10-CM

## 2019-03-30 DIAGNOSIS — Z1159 Encounter for screening for other viral diseases: Secondary | ICD-10-CM

## 2019-03-30 MED ORDER — PEG-ELECTROLYTE SOLN 420 GRAM PO SOLR
0 refills | Status: DC
Start: 2019-03-30 — End: 2019-05-03

## 2019-04-01 ENCOUNTER — Encounter: Admit: 2019-04-01 | Discharge: 2019-04-01

## 2019-04-01 ENCOUNTER — Ambulatory Visit: Admit: 2019-04-01 | Discharge: 2019-04-01

## 2019-04-01 DIAGNOSIS — R918 Other nonspecific abnormal finding of lung field: Secondary | ICD-10-CM

## 2019-04-01 LAB — POC CREATININE, RAD: Lab: 0.7 mg/dL (ref 0.4–1.00)

## 2019-04-01 MED ORDER — SODIUM CHLORIDE 0.9 % IJ SOLN
50 mL | Freq: Once | INTRAVENOUS | 0 refills | Status: CP
Start: 2019-04-01 — End: ?
  Administered 2019-04-01: 14:00:00 50 mL via INTRAVENOUS

## 2019-04-01 MED ORDER — IOHEXOL 350 MG IODINE/ML IV SOLN
60 mL | Freq: Once | INTRAVENOUS | 0 refills | Status: CP
Start: 2019-04-01 — End: ?
  Administered 2019-04-01: 14:00:00 60 mL via INTRAVENOUS

## 2019-04-02 ENCOUNTER — Encounter: Admit: 2019-04-02 | Discharge: 2019-04-02

## 2019-04-02 DIAGNOSIS — R918 Other nonspecific abnormal finding of lung field: Secondary | ICD-10-CM

## 2019-04-02 NOTE — Progress Notes
Notified patient via MyChart.

## 2019-04-04 ENCOUNTER — Encounter: Admit: 2019-04-04 | Discharge: 2019-04-05

## 2019-04-04 DIAGNOSIS — Z1159 Encounter for screening for other viral diseases: Secondary | ICD-10-CM

## 2019-04-04 NOTE — Progress Notes
Patient arrived to [facility] clinic for COVID-19 testing 04/04/19 1205. Patient identity confirmed via photo I.D. Nasopharyngeal procedure explained to the patient.   Nasopharyngeal swab completed left  Patient education provided given and instructed patient self isolate until contacted w/ results and further instructions. CDC handout on COVID-19 given to patient.   [email or web address]    Swab collected by Salley Scarlet.    Date symptoms began/reason for testing: Pre Proc GI Pre-Procedure 8/26

## 2019-04-05 LAB — COVID-19 (SARS-COV-2) PCR

## 2019-04-05 NOTE — Progress Notes
Contacted patient and confirmed name and DOB. Patient advised that COVID-19 test results are negative. Advised that patient can continue with the procedure and should follow pre-procedure instructions. Advised patient to continue with home quarantine until procedure. Advised that if they develop any concerning symptoms prior to the procedure to contact their procedure team, specialist, and/or PCP for assistance.     Nhung Danko, RN

## 2019-04-06 ENCOUNTER — Encounter: Admit: 2019-04-06 | Discharge: 2019-04-06

## 2019-04-06 MED ORDER — ONDANSETRON HCL (PF) 4 MG/2 ML IJ SOLN
4 mg | Freq: Once | INTRAVENOUS | 0 refills | Status: CN | PRN
Start: 2019-04-06 — End: ?

## 2019-04-07 ENCOUNTER — Encounter: Admit: 2019-04-07 | Discharge: 2019-04-07

## 2019-04-07 DIAGNOSIS — J302 Other seasonal allergic rhinitis: Secondary | ICD-10-CM

## 2019-04-07 DIAGNOSIS — M1990 Unspecified osteoarthritis, unspecified site: Secondary | ICD-10-CM

## 2019-04-07 DIAGNOSIS — C50919 Malignant neoplasm of unspecified site of unspecified female breast: Secondary | ICD-10-CM

## 2019-04-07 DIAGNOSIS — E785 Hyperlipidemia, unspecified: Secondary | ICD-10-CM

## 2019-04-07 MED ORDER — LACTATED RINGERS IV SOLP
INTRAVENOUS | 0 refills | Status: DC
Start: 2019-04-07 — End: 2019-04-12

## 2019-04-07 MED ORDER — SIMETHICONE 40 MG/0.6 ML PO DRPS
0 refills | Status: DC
Start: 2019-04-07 — End: 2019-04-12

## 2019-04-07 MED ORDER — PROPOFOL 10 MG/ML IV EMUL 20 ML (INFUSION)(AM)(OR)
INTRAVENOUS | 0 refills | Status: DC
Start: 2019-04-07 — End: 2019-04-07

## 2019-04-07 MED ORDER — PROPOFOL INJ 10 MG/ML IV VIAL
0 refills | Status: DC
Start: 2019-04-07 — End: 2019-04-07

## 2019-04-07 MED ORDER — LIDOCAINE (PF) 10 MG/ML (1 %) IJ SOLN
.1-2 mL | INTRAMUSCULAR | 0 refills | Status: DC | PRN
Start: 2019-04-07 — End: 2019-04-12

## 2019-04-07 NOTE — H&P (View-Only)
Pre Procedure History and Physical/Sedation Plan    Name:Alicia Sharp                                                                   MRN: [id number]                 DOB:March 13, 1956          Age: 63 y.o.  Date of Service: */**    Date of Procedure:  04/07/2019    Planned Procedure(s):  GI:  Colonoscopy  Sedation/Medication Plan: MAC (Monitored Anesthesia Care)  Discussion/Reviews:  Physician has discussed risks and alternatives of this type of sedation and above planned procedures with patient  ___________________________________________________________________  Chief Complaint:  CRC screening    History of Present Illness: Alicia Sharp is a 63 y.o. female for screening colonoscopy. Last exam 2007.        Medical History:   Diagnosis Date   ??? Arthritis    ??? Breast cancer (HCC) 07/2018    left breast   ??? Hyperlipidemia    ??? Seasonal allergies      Surgical History:   Procedure Laterality Date   ??? LEFT RADIOACTIVE SEED LOCALIZED LUMPECTOMY Left 08/17/2018    Performed by Ign[facility]ius [facility]cking, [facility] at [facility]   ??? IDENTIFICATION SENTINEL LYMPH NODE Left 08/17/2018    Performed by [facility], [facility]   ??? INJECTION RADIOACTIVE TRACER FOR SENTINEL NODE IDENTIFICATION Left 08/17/2018    Performed by [facility]ing, [facility] at [facility]   ??? LEFT SENTINEL LYMPH NODE BIOPSY Left 08/17/2018    Performed by [facility]ius [facility], [facility] at [facility]   ??? HX ROTATOR CUFF REPAIR      R 08/2010. L 09/2015.   ??? HX WISDOM TEETH EXTRACTION     ??? KNEE ARTHROSCOPY Left     2014     Pertinent medical/surgical history reviewed  Pertinent family history reviewed  Social History     Tobacco Use   ??? Smoking status: Never Smoker   ??? Smokeless tobacco: Never Used   Substance Use Topics   ??? Alcohol use: Yes     Alcohol/week: 5.0 standard drinks     Types: 5 Glasses of wine per week     Comment: 1 glass of wine/evening   ??? Drug use: Never     Social History     Substance and Sexual Activity   Drug Use Never     Allergies:  Patient has no known allergies. Medications  Current Outpatient Medications   Medication Sig   ??? anastrozole (ARIMIDEX) 1 mg tablet Take one tablet by mouth daily.   ??? atorvastatin (LIPITOR) 40 mg tablet Take one tablet by mouth daily.   ??? cetirizine (ZYRTEC) 10 mg tablet Take 10 mg by mouth every morning.   ??? cholecalciferol (VITAMIN D-3) 1,000 units tablet    ??? cyanocobalamin 1,000 mcg tablet Take one tablet by mouth daily.   ??? diclofenac (VOLTAREN) 1 % topical gel    ??? folic acid 400 mcg tablet Take 800 mcg by mouth daily.   ??? meloxicam (MOBIC) 15 mg tablet    ??? peg-electrolyte solution (NULYTELY) 420 gram oral solution USE AS DIRECTED. DO NOT MIX 24  HOURS PRIOR TO PROCEDURE.   ??? triamcinolone acetonide (KENALOG) 0.025 % topical cream Apply  topically to affected area twice daily.     Current Facility-Administered Medications   Medication   ??? lactated ringers infusion   ??? lidocaine PF 1% (10 mg/mL) injection 0.1-2 mL     Review of Systems:  All other systems reviewed and are negative.           Physical Exam:  Temp: 36.2 ???C (97.2 ???F) (08/26 1610)  Respirations: 16 PER MINUTE (08/26 0648)  BP: 160/67 (08/26 0648)  General appearance: alert  Throat: Lips, mucosa, and tongue normal. Teeth and gums normal  Lungs: clear to auscultation bilaterally  Heart: regular rate and rhythm, S1, S2 normal, no murmur, click, rub or gallop  Abdomen: soft, non-tender. Bowel sounds normal. No masses,  no organomegaly  Extremities: extremities normal, atraumatic, no cyanosis or edema  @  Airway:  airway assessment performed  Mallampati II (soft palate, uvula, fauces visible)  Anesthesia Classification:  ASA II (A normal patient with mild systemic disease)  NPO Status: Acceptable  Pregnancy Status: Not Pregnant    Lab/Radiology/Other Diagnostic Tests  Labs:  No pertinent labs      Onnie Boer, MD  Pager

## 2019-04-07 NOTE — Anesthesia Post-Procedure Evaluation
Post-Anesthesia Evaluation    Name: Alicia Sharp      MRN: [id number]     DOB: 1956-04-11     Age: 63 y.o.     Sex: female   __________________________________________________________________________     Procedure Information     Anesthesia Start Date/Time:  04/07/19 0730    Procedure:  COLONOSCOPY DIAGNOSTIC WITH SPECIMEN COLLECTION BY BRUSHING/ WASHING - FLEXIBLE (N/A )    Location:  [facility] [place] [facility]    Surgeon:  Dario Ave, MD          Post-Anesthesia Vitals  BP: 144/73 (08/26 0835)  Temp: 36.6 C (97.9 F) (08/26 0811)  Respirations: 16 PER MINUTE (08/26 0835)  SpO2: 97 % (08/26 0835)  SpO2 Pulse: 65 (08/26 0835)  Height: 162.6 cm (64") (08/26 0648)   Vitals Value Taken Time   BP 144/73 04/07/2019  8:35 AM   Temp 36.6 C (97.9 F) 04/07/2019  8:11 AM   Pulse     Respirations 16 PER MINUTE 04/07/2019  8:35 AM   SpO2 97 % 04/07/2019  8:35 AM         Post Anesthesia Evaluation Note    Evaluation location: Pre/Post  Patient participation: recovered; patient participated in evaluation  Level of consciousness: alert    Pain score: 0  Pain management: adequate    Hydration: normovolemia  Temperature: 36.0C - 38.4C  Airway patency: adequate    Perioperative Events       Post-op nausea and vomiting: no PONV    Postoperative Status  Cardiovascular status: hemodynamically stable  Respiratory status: spontaneous ventilation  Follow-up needed: none        Perioperative Events  Perioperative Event: No  Emergency Case Activation: No

## 2019-04-07 NOTE — Discharge Instructions - Supplementary Instructions
..  [address] [place], [place] [address]    Phone [phone]  [facility]  Fax [phone]    PROCEDURE:  ? Colonoscopy ? EGD ? Upper EUS ? Lower EUS ? Esophageal Dilatation                ? Biopsies taken  ? Polyps removed   ? Flex Sigmoidoscopy   ? Celiac Plexus Block                   After your procedure you may expect some of the following common symptoms, or perhaps none:  ?  You may have a sore throat for 2-3 days after the procedure.  Sucrets or Cepastat lozenges will help the discomfort.  ?  If you have any abdominal cramping after the procedure this can be relieved by belching or passing air.  ?  A small amount of bleeding is normal if a biopsy was taken or polyps removed.  ACTIVITY:   ?  Rest at home for the next 12 hours.  You may then begin to resume your normal activities.   ?  DO NOT drive any vehicle, operate any power tools, make any major decisions, or sign any legal documents for the next 12 hours.   ? You need someone to stay with you for 12 hours after the procedure.     MEDICATIONS:   ?  Resume your normal medications and their schedule.   ?  Start new prescription:  __________________________________   ?  Stop the following medications:  ___________________________   ? Avoid aspirin and aspirin containing products such as Anacin, Bufferin or Alka Seltzer for ________________ or as        Directed by your physician if a polypectomy has been performed.  (Tylenol is OK to use.)  DIET:   ?  If no nausea or vomiting occurs, you may resume your normal diet.   ? DO NOT drink alcohol, including beer or wine for the next 12 hours.   ? High fiber diet.    IV SITE:  If IV site is painful, place a warm, wet compress on the area and repeat until the soreness is better.  If not improved in 3 days, notify your physician's office.    CONTACT YOUR DOCTOR IF YOU HAVE:  - Nausea or vomiting that lasts greater than 12 hours.  - A temperature greater than 101???F unrelieved by Tylenol. - Severe or unusual abdominal discomfort or pain.  - Any bleeding from your mouth or bowels greater than two tablespoons and increasing.  - Increased shortness of breath.  - Coughing up blood, black tarry looking stools or passage of blood.  - Difficulty swallowing.  IF YOUR DOCTOR TOOK BIOPSIES OR TISSUE SAMPLES:  - Please allow 10 days for the results to be completed. You will receive a letter in the mail with results.  If you have any concerns after the procedure call:     *   GI Clinic [phone] (follow prompts to the doctors nurse)    - If after 5pm or weekends contact GI Fellow on call through the [place] operator [phone]    Next appointment/Follow up:  __See Report________________________________________________        I acknowledge receipt of the instructions above and understand them as explained.    We hope that your visit was a pleasant one.  Please complete the emailed patient satisfaction survey at your convenience. We appreciate your comments regarding your care.

## 2019-04-08 ENCOUNTER — Encounter: Admit: 2019-04-08 | Discharge: 2019-04-08

## 2019-04-08 DIAGNOSIS — E785 Hyperlipidemia, unspecified: Secondary | ICD-10-CM

## 2019-04-08 DIAGNOSIS — M1990 Unspecified osteoarthritis, unspecified site: Secondary | ICD-10-CM

## 2019-04-08 DIAGNOSIS — C50919 Malignant neoplasm of unspecified site of unspecified female breast: Secondary | ICD-10-CM

## 2019-04-08 DIAGNOSIS — J302 Other seasonal allergic rhinitis: Secondary | ICD-10-CM

## 2019-04-12 ENCOUNTER — Encounter: Admit: 2019-04-12 | Discharge: 2019-04-12

## 2019-04-12 ENCOUNTER — Ambulatory Visit: Admit: 2019-04-12 | Discharge: 2019-04-13

## 2019-04-12 DIAGNOSIS — R918 Other nonspecific abnormal finding of lung field: Secondary | ICD-10-CM

## 2019-04-12 DIAGNOSIS — C50919 Malignant neoplasm of unspecified site of unspecified female breast: Secondary | ICD-10-CM

## 2019-04-12 DIAGNOSIS — E785 Hyperlipidemia, unspecified: Secondary | ICD-10-CM

## 2019-04-12 DIAGNOSIS — E7800 Pure hypercholesterolemia, unspecified: Secondary | ICD-10-CM

## 2019-04-12 DIAGNOSIS — G8929 Other chronic pain: Secondary | ICD-10-CM

## 2019-04-12 DIAGNOSIS — J309 Allergic rhinitis, unspecified: Secondary | ICD-10-CM

## 2019-04-12 DIAGNOSIS — C50412 Malignant neoplasm of upper-outer quadrant of left female breast: Secondary | ICD-10-CM

## 2019-04-12 DIAGNOSIS — J302 Other seasonal allergic rhinitis: Secondary | ICD-10-CM

## 2019-04-12 DIAGNOSIS — M1990 Unspecified osteoarthritis, unspecified site: Secondary | ICD-10-CM

## 2019-04-12 DIAGNOSIS — R202 Paresthesia of skin: Principal | ICD-10-CM

## 2019-04-12 DIAGNOSIS — D649 Anemia, unspecified: Secondary | ICD-10-CM

## 2019-04-12 NOTE — Patient Instructions
Please don't hesitate to call if you have any problems or questions. My nurse Gaspar Bidding can be reached at [phone].      If Gaspar Bidding does not answer, please leave a voicemail. Out of respect for patient/call volume, please only call once in a 24 hour period and my nurse will return your call as soon as he can.  You may also message [place] in [place]. Please note that it may take up to two business days to reply to [employer].     For refills on medications, please have your pharmacy fax a refill authorization request form to our office at Fax: [phone]. Please allow at least 3 business days for refill requests.     For urgent issues after business hours/weekends/holidays call [phone] and request for the outpatient internal medicine physician to be paged.    We offer same day appointments for your acute health concerns. These appointments are on a first come, first serve basis. Please call [phone] if you would like to make an appointment. If I am not available, you can see any of my partners or try to see me the next day (call at 8am).      Take care,     Dr. Kate Sable and Gaspar Bidding    Future Appointments   Date Time Provider [place]   04/30/2019 10:00 AM Prager, Sharyn Lull, APRN-NP [place] Exam   05/06/2019  1:00 PM Simonne Come, MD [facility] Ortho Sports   07/02/2019  1:00 PM [facility] - IC ROOM 1 [id number] Radiolog   07/16/2019  8:15 AM Payton Doughty, MD [id number] [place] Exam   07/16/2019  8:15 AM B[facility] IC1  BIOIMPEDENCE SPECTROSCOPY [id number] [place] Exam   08/18/2019 10:00 AM Hebert Soho, MD [facility]  Radiati   10/05/2019  7:30 AM CT - IC IC1CT [facility] Radiolog

## 2019-04-12 NOTE — Progress Notes
Date of Service: 04/12/2019    Alicia Sharp is a 63 y.o. female. DOB: May 15, 1956   MRN#: [id number]    Subjective:       History of Present Illness  Alicia Sharp Alicia Sharp???is a 63 y.o.???female???with history of breast cancer who presents today for 3 mo follow up regarding numbness and tingling in her R foot. Since the last visit, patient reports same symptoms. She was evaluated with EMG which was normal. Was referred to orthopedic surgery for further evaluation. Per notes, was thought to be secondary to Morton's neuroma or nerve impingement. During her orthopedic visit, she received steroid injection with minimal relief in symptoms. She says she has an appointment with her orthopedic physician to discuss possible surgical intervention. She reports compliance with her atorvastatin, but is hoping that she can stop the medication, as she likes to limit her medications. She reports improvement in her diet, limiting fat intake. Her axilla rash resolved. 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         Review of Systems   Constitution: Negative for fever and weight gain.   HENT: Negative for congestion.    Eyes: Positive for blurred vision.   Cardiovascular: Negative for chest pain and palpitations.   Respiratory: Negative for cough.    Endocrine: Negative for cold intolerance, heat intolerance and polydipsia.   Hematologic/Lymphatic: Negative for adenopathy and bleeding problem. Does not bruise/bleed easily.   Skin: Negative for rash.   Musculoskeletal: Negative for arthritis, back pain and joint pain.   Gastrointestinal: Negative for bloating and abdominal pain.   Genitourinary: Negative for dysuria, frequency and hematuria.   Neurological: Negative for dizziness.         Objective:     ??? anastrozole (ARIMIDEX) 1 mg tablet Take one tablet by mouth daily.   ??? atorvastatin (LIPITOR) 40 mg tablet Take one tablet by mouth daily.   ??? cetirizine (ZYRTEC) 10 mg tablet Take 10 mg by mouth every morning. ??? cholecalciferol (VITAMIN D-3) 1,000 units tablet    ??? cyanocobalamin 1,000 mcg tablet Take one tablet by mouth daily.   ??? diclofenac (VOLTAREN) 1 % topical gel    ??? folic acid 400 mcg tablet Take 800 mcg by mouth daily.   ??? meloxicam (MOBIC) 15 mg tablet    ??? peg-electrolyte solution (NULYTELY) 420 gram oral solution USE AS DIRECTED. DO NOT MIX 24 HOURS PRIOR TO PROCEDURE.   ??? triamcinolone acetonide (KENALOG) 0.025 % topical cream Apply  topically to affected area twice daily.     Vitals:    04/12/19 1334   Resp: 16   Temp: 37.1 ???C (98.7 ???F)   TempSrc: Oral   Weight: 86.3 kg (190 lb 3.2 oz)   Height: 161.3 cm (63.5)   PainSc: Zero     Body mass index is 33.16 kg/m???.     Physical Exam  Vitals signs and nursing note reviewed.   Constitutional:       General: She is not in acute distress.  HENT:      Head: Normocephalic and atraumatic.      Right Ear: External ear normal.      Left Ear: External ear normal.      Nose: Nose normal.   Eyes:      General:         Right eye: No discharge.         Left eye: No discharge.      Pupils: Pupils are equal,  round, and reactive to light.   Cardiovascular:      Heart sounds: No murmur.   Pulmonary:      Effort: Pulmonary effort is normal. No respiratory distress.   Abdominal:      General: Abdomen is flat. There is no distension.      Palpations: Abdomen is soft. There is no mass.      Tenderness: There is no abdominal tenderness.   Musculoskeletal:         General: No swelling or tenderness.   Lymphadenopathy:      Cervical: No cervical adenopathy.   Skin:     Coloration: Skin is not jaundiced or pale.   Neurological:      General: No focal deficit present.      Mental Status: She is alert.   Psychiatric:         Mood and Affect: Mood normal.         Behavior: Behavior normal.         Thought Content: Thought content normal.              Assessment and Plan:  Alicia Sharp???is a 63 y.o.???female???with history of breast cancer who presents today for 75-month follow-up concerning paresthesias. Return to clinic in 6 mo for telehealth    Paresthesias; Numbess and Tingling  Morton's Neuroma  - history of numbness and tingling in her R foot prior to her diagnosis of breast cancer.   - Symptoms are constant, but states that they do not interfere with her life.   - reported numbness and tingling for ~1 year. Mostly localized to right foot.   - Patient no longer using capsaicin cream, as patient denies any pain.  - Offered gabapentin, however patient declined  Plan:  > Thiamine and folate replacement  > EMG studies ordered, no abnormalities. Evaluated by Orthopedic surgery - receiving corticosteroid injections. Plans to return to orthopedic surgery again for possible surgical intervention   ???  Right Axilla Rash - Resolved  ???  Health Maintenance  - Cervical Cancer Screening: last pap smear unknown. History of irregular pap smears.   - She reports history of abnormal pap smears, reported normal pap smear in 2007. Pap smear 01/18/2019 wnl, neg high-risk HPV  - Diabetes Screening: A1c 5.3 on 12/01/2018  - HIV, Hepatitis B, C: declined   - Chlamydia, Gonorrhea Screening: declined  - Colonoscopy 04/07/2019 without polyps. Repeat in /10 years  - Shingles vaccine administered on 6/8, 2nd ordered   ???  Hyperlipidemia  - Calcium score 22 on 5/11; 4/21 total cholesterol 218, LDL 143  - coenzyme Q10 and fish oil, atorvastatin   - vitamin D3 and folic acid by her oncologist.   Plan:  > Repeat Lipid panel in 2 months  ???  Allergic Rhinitis  - cetirizine  ???  Pulmonary Nodules  - CT scan 5/11 pulmonary nodules and post-surgical changes in her breast on her CT.  - Given her history of breast cancer, notified medical, surgical, and radiation oncologists   - Repeat CT scan in 3 months with stable nodules.   Plan:  > Repeat in 6 months.   ???  Normocytic Anemia  - Hemoglobin 11.8, MCV 94.2 on 12/01/2018; Folic acid > 22.3, Vit B12 161  Plan: > No overt signs of bleeding. Colonoscopy 8/26 without polyps.  > Repeat CBC, peripheral smear 2 months  ???  Chronic Knee Pain  - associated with  numbness and tingling.   - history of arthritis  in her knees.   - evaluated by orthopedic surgery in [place] and was told that she would need a knee replacement in the near future.   - feels pain is under control currently with meloxicam and diclofenac gel.  Plan:  > orthopedic consult placed 4/13. Patient reports that she has an appointment scheduled.   ???  Breast Cancer  - s/p lumpectomy and RT. She follows with Dr. Biagio Borg  - on anastrazole therapy.   ???  Patient seen and discussed with Dr. Theresa Mulligan.    Denny Levy, M.D.  Internal Medicine PGY-2  Available on [employer]      Problem   Pure Hypercholesterolemia          Health Care Maintenance            Chronic Knee Pain           Paresthesias          Dermatitis (Resolved)

## 2019-04-13 ENCOUNTER — Encounter: Admit: 2019-04-13 | Discharge: 2019-04-13

## 2019-04-13 DIAGNOSIS — Z0000 Encounter for general adult medical examination without abnormal findings: Secondary | ICD-10-CM

## 2019-04-13 DIAGNOSIS — M25569 Pain in unspecified knee: Secondary | ICD-10-CM

## 2019-04-13 DIAGNOSIS — Z23 Encounter for immunization: Secondary | ICD-10-CM

## 2019-04-13 DIAGNOSIS — Z170 Estrogen receptor positive status [ER+]: Secondary | ICD-10-CM

## 2019-04-13 DIAGNOSIS — C50919 Malignant neoplasm of unspecified site of unspecified female breast: Secondary | ICD-10-CM

## 2019-04-13 DIAGNOSIS — E785 Hyperlipidemia, unspecified: Secondary | ICD-10-CM

## 2019-04-13 DIAGNOSIS — J302 Other seasonal allergic rhinitis: Secondary | ICD-10-CM

## 2019-04-13 DIAGNOSIS — M1990 Unspecified osteoarthritis, unspecified site: Secondary | ICD-10-CM

## 2019-04-13 NOTE — Progress Notes
I discussed the care of [name] with Dr. Valerie Salts, MD at the time of the visit, and I agree with the evaluation and plan as documented by this resident, unless noted below, or in the resident's note in bold, blue italics.       Encounter Diagnoses   Name Primary?    Paresthesias Yes    Pulmonary nodules     Pure hypercholesterolemia     Hyperlipidemia, unspecified hyperlipidemia type     Normocytic anemia     Health care maintenance     Chronic knee pain, unspecified laterality     Allergic rhinitis, unspecified seasonality, unspecified trigger     Malignant neoplasm of upper-outer quadrant of left breast in female, estrogen receptor positive ([place])         Orders Placed This Encounter    ZOSTER VACCINE RECOM, ADJUVANT SUSPENSION COMPONENT (VIAL 1 OF 2)    ZOSTER VACCINE RECOMB, ADJ LYOPHILIZED COMPONENT (VIAL 2 OF 2)    LIPID PROFILE    CBC AND DIFF    PERIPHERAL SMEAR     Return in about 6 months (around 10/10/2019), or Follow up anemia, lipids, for Telehealth.      Patrcia Dolly, MD

## 2019-04-14 ENCOUNTER — Encounter: Admit: 2019-04-14 | Discharge: 2019-04-14

## 2019-04-29 ENCOUNTER — Encounter: Admit: 2019-04-29 | Discharge: 2019-04-29 | Payer: No Typology Code available for payment source

## 2019-04-30 NOTE — Patient Instructions
Thank you for coming to see us today.   Please call our office or send a message through MyChart if you have any questions or concerns.          Dr. Anne O'Dea and Michelle Prager, APRN  [phone] (Nurse Tara Gardner/Elisheva Fallas)  [phone] (fax)    [facility] [facility]  [address]  [place], [place] [address]    [facility]  [address]  [place], [place] [address]

## 2019-05-02 ENCOUNTER — Encounter: Admit: 2019-05-02 | Discharge: 2019-05-02 | Payer: No Typology Code available for payment source

## 2019-05-03 ENCOUNTER — Encounter: Admit: 2019-05-03 | Discharge: 2019-05-03 | Payer: No Typology Code available for payment source

## 2019-05-03 MED ORDER — ANASTROZOLE 1 MG PO TAB
1 mg | ORAL_TABLET | Freq: Every day | ORAL | 3 refills | 33.00000 days | Status: DC
Start: 2019-05-03 — End: 2020-03-03

## 2019-05-04 ENCOUNTER — Ambulatory Visit: Admit: 2019-05-04 | Discharge: 2019-05-05 | Payer: No Typology Code available for payment source

## 2019-05-04 ENCOUNTER — Encounter: Admit: 2019-05-04 | Discharge: 2019-05-04 | Payer: No Typology Code available for payment source

## 2019-05-05 ENCOUNTER — Encounter: Admit: 2019-05-05 | Discharge: 2019-05-05 | Payer: No Typology Code available for payment source

## 2019-05-06 ENCOUNTER — Encounter: Admit: 2019-05-06 | Discharge: 2019-05-06 | Payer: No Typology Code available for payment source

## 2019-05-06 ENCOUNTER — Ambulatory Visit: Admit: 2019-05-06 | Discharge: 2019-05-06 | Payer: No Typology Code available for payment source

## 2019-05-12 ENCOUNTER — Encounter: Admit: 2019-05-12 | Discharge: 2019-05-12 | Payer: No Typology Code available for payment source

## 2019-05-13 ENCOUNTER — Encounter: Admit: 2019-05-13 | Discharge: 2019-05-13 | Payer: No Typology Code available for payment source

## 2019-05-18 ENCOUNTER — Encounter: Admit: 2019-05-18 | Discharge: 2019-05-18 | Payer: No Typology Code available for payment source

## 2019-05-19 MED ORDER — ROPIVACAINE (PF) 5 MG/ML (0.5 %) IJ SOLN
5 mL | Freq: Once | INTRAMUSCULAR | 0 refills | Status: CP | PRN
Start: 2019-05-19 — End: ?
  Administered 2019-05-20: 01:00:00 5 mL via INTRAMUSCULAR

## 2019-05-19 MED ORDER — METHYLPREDNISOLONE ACETATE 80 MG/ML IJ SUSP
80 mg | Freq: Once | INTRA_ARTICULAR | 0 refills | Status: CP | PRN
Start: 2019-05-19 — End: ?
  Administered 2019-05-20: 01:00:00 80 mg via INTRA_ARTICULAR

## 2019-05-19 MED ORDER — DEXAMETHASONE SODIUM PHOSPHATE 10 MG/ML IJ SOLN
10 mg | Freq: Once | 0 refills | Status: CP | PRN
Start: 2019-05-19 — End: ?
  Administered 2019-05-20: 01:00:00 10 mg via INTRA_ARTICULAR

## 2019-05-20 MED ORDER — MELOXICAM 15 MG PO TAB
15 mg | ORAL_TABLET | Freq: Every day | ORAL | 1 refills | 30.00000 days | Status: DC
Start: 2019-05-20 — End: 2019-11-05

## 2019-05-20 NOTE — Procedures
Large Joint Drain/Inject: R greater trochanteric bursa (Greater trochanteric bursa)    Consent:   Consent obtained: verbal  Consent given by: patient  Risks discussed: arrhythmia, bleeding, damage to surrounding structures, hyperglycemia, infection, pain, skin discoloration, soft tissue reaction, subcutaneous fat atrophy, tendon rupture and vasovagal reaction  Alternatives discussed: alternative treatment, delayed treatment, no treatment and referral     Universal Protocol:  Imaging studies: imaging studies available  Site marked: the operative site was marked  Patient identity confirmed: Patient identify confirmed verbally with patient.        Time out: Immediately prior to procedure a time out was called to verify the correct patient, procedure, equipment, support staff and site/side marked as required      Procedures Details:  Indications: pain  Details:Prep: 2% chlorhexidine  Local anesthetic: ethyl chloride spray   22 G needle, ultrasound-guided  Justification for use of ultrasound guidance: The use of direct sonographic visualization of the needle was required to ensure accurate injection placement for diagnostic specificity, to maximize clinical efficacy and for safety purposes to minimize risk of bleeding or injury to nearby neurovascular structures   posterior approachMedications: 10 mg dexamethasone 10 mg/mL; 80 mg methylprednisolone acetate 80 mg/mL; 5 mL ropivacaine (PF) 0.5% (5 mg/mL)  Outcome: tolerated well, no immediate complications    Comments: Patient is referred to me by Dr. Crista Sharp who recently saw her primarily for her bilateral knee pain but also examined her right hip and determined that she would benefit from an ultrasound-guided corticosteroid injection.  I am unable to determine from Dr. Myriam Sharp incomplete note whether this is an intra-articular injection for some mild hip OA or greater trochanteric bursal injection but after reviewing this with patient she points directly to the greater trochanteric region is the primary site of pain.  I reviewed patient history and performed a brief examination of her right hip to confirm indications and lack of contraindications for the proposed procedure.  I also reviewed her plain radiographs of her right hip.  I reviewed the procedure in depth, including potential risks and benefits, and patient expressed full understanding and gave verbal consent to proceed today.  Incidentally, patient retired as a Market researcher in [place] and moved to [place] to be closer to with friends and activities she enjoys.  She has been prescribed physical therapy by Dr. Ree Sharp who recently saw her for right foot paresthesias and Dr. Hattie Sharp for her knees and hip and was scheduled for this physical therapy at the beginning of next week at [facility].  She is hopeful to have PT closer to her residence as she lives closer to [facility] and prefers not traveling down to [address].      Posterolateral right hip was cleansed with alcohol and ultrasound probe used to identify posterior facet of greater trochanter and adjacent trochanteric bursa, then marked with sterile marking pen. Posterolateral hip then prepped with chlorhexidine and ultrasound probe cleansed. Using caution to enter aseptically cleansed skin with spinal needle, under US guidance the needle tip was visualized entering the greater trochanteric bursa and the medication was injected without difficulty.   Instructions in AVS reviewed in detail and printed for patient at checkout.

## 2019-05-24 ENCOUNTER — Encounter: Admit: 2019-05-24 | Discharge: 2019-05-24 | Payer: No Typology Code available for payment source

## 2019-05-24 ENCOUNTER — Ambulatory Visit: Admit: 2019-05-24 | Discharge: 2019-05-24 | Payer: No Typology Code available for payment source

## 2019-05-24 DIAGNOSIS — D649 Anemia, unspecified: Secondary | ICD-10-CM

## 2019-05-24 DIAGNOSIS — Z006 Encounter for examination for normal comparison and control in clinical research program: Secondary | ICD-10-CM

## 2019-05-24 DIAGNOSIS — E538 Deficiency of other specified B group vitamins: Secondary | ICD-10-CM

## 2019-05-24 DIAGNOSIS — D72810 Lymphocytopenia: Secondary | ICD-10-CM

## 2019-05-24 DIAGNOSIS — E785 Hyperlipidemia, unspecified: Secondary | ICD-10-CM

## 2019-05-24 LAB — CBC AND DIFF
Lab: 0 10*3/uL (ref 0–0.20)
Lab: 0.1 10*3/uL (ref 0–0.45)
Lab: 0.6 10*3/uL (ref 0–0.80)
Lab: 1 % (ref 0–2)
Lab: 1 % (ref 0–5)
Lab: 11 g/dL — ABNORMAL LOW (ref 12.0–15.0)
Lab: 2 10*3/uL (ref 1.0–4.8)
Lab: 3.6 M/UL — ABNORMAL LOW (ref 4.0–5.0)
Lab: 35 % — ABNORMAL LOW (ref 36–45)
Lab: 4.9 10*3/uL (ref 1.8–7.0)
Lab: 7.9 10*3/uL (ref 4.5–11.0)
Lab: 8 % (ref 4–12)

## 2019-05-24 LAB — IRON + BINDING CAPACITY + %SAT+ FERRITIN
Lab: 12 ng/mL (ref 10–200)
Lab: 362 ug/dL (ref 270–380)
Lab: 74 ug/dL (ref 50–160)

## 2019-05-24 LAB — LIPID PROFILE
Lab: 168 mg/dL (ref <200)
Lab: 68 mg/dL (ref >40)
Lab: 72 mg/dL (ref <150)
Lab: 84 mg/dL (ref <100)

## 2019-05-24 LAB — VITAMIN B12: Lab: 840 pg/mL — ABNORMAL LOW (ref 180–914)

## 2019-05-25 LAB — PERIPHERAL SMEAR

## 2019-05-26 ENCOUNTER — Encounter: Admit: 2019-05-26 | Discharge: 2019-05-26 | Payer: No Typology Code available for payment source

## 2019-05-26 DIAGNOSIS — D649 Anemia, unspecified: Secondary | ICD-10-CM

## 2019-05-26 NOTE — Telephone Encounter
Notified patient about lab results. Plan to repeat CBC with diff in 1 year. Orders placed.

## 2019-05-31 ENCOUNTER — Encounter: Admit: 2019-05-31 | Discharge: 2019-05-31 | Payer: No Typology Code available for payment source

## 2019-06-07 ENCOUNTER — Encounter: Admit: 2019-06-07 | Discharge: 2019-06-07 | Payer: No Typology Code available for payment source

## 2019-06-10 ENCOUNTER — Encounter: Admit: 2019-06-10 | Discharge: 2019-06-10 | Payer: No Typology Code available for payment source

## 2019-06-14 ENCOUNTER — Encounter: Admit: 2019-06-14 | Discharge: 2019-06-14 | Payer: No Typology Code available for payment source

## 2019-06-21 ENCOUNTER — Encounter: Admit: 2019-06-21 | Discharge: 2019-06-21 | Payer: No Typology Code available for payment source

## 2019-06-24 ENCOUNTER — Encounter: Admit: 2019-06-24 | Discharge: 2019-06-24 | Payer: No Typology Code available for payment source

## 2019-06-24 DIAGNOSIS — Z006 Encounter for examination for normal comparison and control in clinical research program: Secondary | ICD-10-CM

## 2019-06-28 ENCOUNTER — Encounter: Admit: 2019-06-28 | Discharge: 2019-06-28 | Payer: No Typology Code available for payment source

## 2019-06-30 ENCOUNTER — Encounter: Admit: 2019-06-30 | Discharge: 2019-06-30 | Payer: No Typology Code available for payment source

## 2019-07-02 ENCOUNTER — Ambulatory Visit: Admit: 2019-07-02 | Discharge: 2019-07-02 | Payer: No Typology Code available for payment source

## 2019-07-02 ENCOUNTER — Encounter: Admit: 2019-07-02 | Discharge: 2019-07-02 | Payer: No Typology Code available for payment source

## 2019-07-02 DIAGNOSIS — Z9889 Other specified postprocedural states: Secondary | ICD-10-CM

## 2019-07-02 DIAGNOSIS — C50412 Malignant neoplasm of upper-outer quadrant of left female breast: Secondary | ICD-10-CM

## 2019-07-27 ENCOUNTER — Encounter: Admit: 2019-07-27 | Discharge: 2019-07-27 | Payer: No Typology Code available for payment source

## 2019-07-30 NOTE — Patient Instructions
Thank you for coming to see us today.   Please call our office or send a message through MyChart if you have any questions or concerns.          Dr. Anne O'Dea and Michelle Prager, APRN  [phone] (Nurse Tara Gardner/Tennessee Perra)  [phone] (fax)    [facility] [facility]  [address]  [place], [place] [address]    [facility]  [address]  [place], [place] [address]

## 2019-08-02 ENCOUNTER — Encounter: Admit: 2019-08-02 | Discharge: 2019-08-02 | Payer: No Typology Code available for payment source

## 2019-08-02 DIAGNOSIS — C50919 Malignant neoplasm of unspecified site of unspecified female breast: Secondary | ICD-10-CM

## 2019-08-02 DIAGNOSIS — J302 Other seasonal allergic rhinitis: Secondary | ICD-10-CM

## 2019-08-02 DIAGNOSIS — M1990 Unspecified osteoarthritis, unspecified site: Secondary | ICD-10-CM

## 2019-08-02 DIAGNOSIS — E785 Hyperlipidemia, unspecified: Secondary | ICD-10-CM

## 2019-08-18 ENCOUNTER — Encounter

## 2019-08-18 DIAGNOSIS — J302 Other seasonal allergic rhinitis: Secondary | ICD-10-CM

## 2019-08-18 DIAGNOSIS — C50919 Malignant neoplasm of unspecified site of unspecified female breast: Secondary | ICD-10-CM

## 2019-08-18 DIAGNOSIS — E785 Hyperlipidemia, unspecified: Secondary | ICD-10-CM

## 2019-08-18 DIAGNOSIS — M1990 Unspecified osteoarthritis, unspecified site: Secondary | ICD-10-CM

## 2019-09-20 ENCOUNTER — Encounter: Admit: 2019-09-20 | Discharge: 2019-09-20 | Payer: No Typology Code available for payment source

## 2019-09-26 ENCOUNTER — Encounter: Admit: 2019-09-26 | Discharge: 2019-09-26 | Payer: No Typology Code available for payment source

## 2019-09-27 ENCOUNTER — Ambulatory Visit: Admit: 2019-09-27 | Discharge: 2019-09-28 | Payer: No Typology Code available for payment source

## 2019-09-27 ENCOUNTER — Encounter: Admit: 2019-09-27 | Discharge: 2019-09-27 | Payer: No Typology Code available for payment source

## 2019-09-27 DIAGNOSIS — E785 Hyperlipidemia, unspecified: Secondary | ICD-10-CM

## 2019-09-27 DIAGNOSIS — C50919 Malignant neoplasm of unspecified site of unspecified female breast: Secondary | ICD-10-CM

## 2019-09-27 DIAGNOSIS — R202 Paresthesia of skin: Secondary | ICD-10-CM

## 2019-09-27 DIAGNOSIS — Z0000 Encounter for general adult medical examination without abnormal findings: Secondary | ICD-10-CM

## 2019-09-27 DIAGNOSIS — M25569 Pain in unspecified knee: Secondary | ICD-10-CM

## 2019-09-27 DIAGNOSIS — M1990 Unspecified osteoarthritis, unspecified site: Secondary | ICD-10-CM

## 2019-09-27 DIAGNOSIS — J302 Other seasonal allergic rhinitis: Secondary | ICD-10-CM

## 2019-09-27 DIAGNOSIS — E7800 Pure hypercholesterolemia, unspecified: Secondary | ICD-10-CM

## 2019-09-27 MED ORDER — ATORVASTATIN 40 MG PO TAB
20 mg | ORAL_TABLET | Freq: Every day | ORAL | 3 refills | Status: DC
Start: 2019-09-27 — End: 2019-09-29

## 2019-09-27 NOTE — Progress Notes
Telehealth Visit Note    Date of Service: 09/27/2019    Subjective:      Obtained patient's verbal consent to treat them and their agreement to [facility] financial policy and NPP via this telehealth visit during the [facility]       Alicia Sharp is a 64 y.o. female.    History of Present Illness  Alicia Sharp?is a 64 y.o.?female?with history of breast cancer who presents today for follow up. She is following with Dr. Biagio Borg and continues to take anastrazole. For her elevated cholesterol, she has been taking atorvastatin 40 mg daily. She has been eating poorly the past couple weeks - fast food - as she has been busy with family. She just signed up for [employer] and is motivated to eat healthier. Her exercise has been decreased, but she has set a goal increase her daily steps. She has been trying to stay safe during the pandemic. She still works in the [employer]. She is interested in getting the covid vaccine when it becomes available. She continues to have numbness and tingling in her foot, but states that she does not want surgery for this. She is following with orthopedic surgery.          Review of Systems   Constitutional: Negative.    HENT: Negative.    Eyes: Negative.    Respiratory: Negative.    Cardiovascular: Negative.    Gastrointestinal: Negative.    Endocrine: Negative.    Genitourinary: Negative.    Musculoskeletal: Negative.    Skin: Negative.    Neurological: Negative.    Hematological: Negative.    Psychiatric/Behavioral: Negative.    All other systems reviewed and are negative.        Objective:         ? anastrozole (ARIMIDEX) 1 mg tablet Take one tablet by mouth daily.   ? atorvastatin (LIPITOR) 40 mg tablet Take one tablet by mouth daily.   ? cholecalciferol (VITAMIN D-3) 1,000 units tablet    ? meloxicam (MOBIC) 15 mg tablet Take one tablet by mouth daily.     There were no vitals filed for this visit.  There is no height or weight on file to calculate BMI. Physical Exam  Vitals signs reviewed.   Constitutional:       General: She is not in acute distress.     Appearance: She is not ill-appearing.   HENT:      Head: Normocephalic.      Nose: Nose normal.   Eyes:      General:         Right eye: No discharge.         Left eye: No discharge.      Extraocular Movements: Extraocular movements intact.   Pulmonary:      Effort: Pulmonary effort is normal.   Musculoskeletal:         General: No swelling.   Skin:     Coloration: Skin is not jaundiced.   Neurological:      General: No focal deficit present.      Mental Status: She is alert.   Psychiatric:         Mood and Affect: Mood normal.         Behavior: Behavior normal.              Assessment and Plan:  Alicia Sharp?is a 65 y.o.?female?with history of breast cancer who presents today?for 12-month follow-up. We will decrease  atorvastatin to 20 mg as her cholesterol has improved. Will order repeat lipid panel in 3 months. If she continues to have good control, will consider discontinuing atorvastatin. Return to clinic in 6 months. ?    Health Maintenance  - Cervical Cancer Screening: last pap smear unknown. History of irregular pap smears.   - She reports history of abnormal pap smears,?reported normal pap smear in 2007. Pap smear 01/18/2019 wnl, neg high-risk HPV  - Diabetes Screening: A1c 5.3 on 12/01/2018  - HIV, Hepatitis B, C: declined   - Chlamydia, Gonorrhea Screening: declined  - Colonoscopy 04/07/2019 without polyps. Repeat in /10 years  - Shingles vaccine?administered on 6/8, 2nd ordered   ?  Hyperlipidemia  - Calcium score 22 on 5/11; 4/21 total cholesterol 218, LDL 143  -?coenzyme Q10 and fish oil, atorvastatin   -?vitamin D3 and folic acid by her oncologist.?  Plan:  > Repeat Lipid panel 05/2019 with cholesterol improved to 168  ?  Paresthesias; Numbess and Tingling  Morton's Neuroma    Pulmonary Nodules  - CT scan 5/11?pulmonary nodules and post-surgical changes in her breast on her CT.  -?Given her history of breast cancer, notified medical, surgical, and radiation oncologists   - Repeat CT scan in 3 months with stable nodules.   Plan:  > Repeat in 6 months.   ?  Normocytic Anemia  - Hemoglobin 11.8, MCV 94.2 on 12/01/2018; Folic acid > 22.3, Vit B12 161  - Iron studies 05/2019 with iron 74, % sat 20, TIBC 362, ferritin 12  Plan:  > No overt signs of bleeding. Colonoscopy 8/26 without polyps.  > Peripheral smear 06/02/2019 with no qualitative morphologic abnormalities. Repeat CBC, peripheral smear 1 year  ?  Chronic Knee Pain  - Following with orthopedic surgery  ?  Breast Cancer  -?s/p lumpectomy and RT. She follows with Dr. Biagio Borg  - on anastrazole therapy.?  ?  Patient seen and plan discussed with Dr.?McRae.  Problem   Pure Hypercholesterolemia           Health Care Maintenance             Chronic Knee Pain            Paresthesias           Malignant Neoplasm of Female Breast ([facility])                              25 minutes spent on this patient's encounter with counseling and coordination of care taking >50% of the visit.

## 2019-09-27 NOTE — Patient Instructions
Please don't hesitate to call if you have any problems or questions. My nurse Gaspar Bidding can be reached at [phone].      If Gaspar Bidding does not answer, please leave a voicemail. Out of respect for patient/call volume, please only call once in a 24 hour period and my nurse will return your call as soon as he can.  You may also message [place] in [place]. Please note that it may take up to two business days to reply to [employer].     For refills on medications, please have your pharmacy fax a refill authorization request form to our office at Fax: [phone]. Please allow at least 3 business days for refill requests.     For urgent issues after business hours/weekends/holidays call [phone] and request for the outpatient internal medicine physician to be paged.    We offer same day appointments for your acute health concerns. These appointments are on a first come, first serve basis. Please call [phone] if you would like to make an appointment. If I am not available, you can see any of my partners or try to see me the next day (call at 8am).      Take care,     Dr. Kate Sable and Gaspar Bidding    Future Appointments   Date Time Provider [place]   10/05/2019  7:30 AM CT - IC IC1CT ICC Radiolog   12/07/2019  3:30 PM Prager, Edwinna Areola, APRN-NP [id number] [place] Exam   01/03/2020  2:30 PM Magdalene Patricia, APRN-NP [id number] [place] Exam   01/03/2020  2:30 PM BIS IC1  BIOIMPEDENCE SPECTROSCOPY [id number] [place] Exam

## 2019-09-29 ENCOUNTER — Encounter: Admit: 2019-09-29 | Discharge: 2019-09-29 | Payer: No Typology Code available for payment source

## 2019-09-29 MED ORDER — ATORVASTATIN 40 MG PO TAB
40 mg | ORAL_TABLET | Freq: Every day | ORAL | 3 refills | Status: DC
Start: 2019-09-29 — End: 2019-11-01

## 2019-10-05 ENCOUNTER — Encounter: Admit: 2019-10-05 | Discharge: 2019-10-05 | Payer: No Typology Code available for payment source

## 2019-10-05 ENCOUNTER — Ambulatory Visit: Admit: 2019-10-05 | Discharge: 2019-10-05 | Payer: No Typology Code available for payment source

## 2019-10-05 DIAGNOSIS — R918 Other nonspecific abnormal finding of lung field: Secondary | ICD-10-CM

## 2019-10-05 LAB — POC CREATININE, RAD: Lab: 0.8 mg/dL (ref 0.4–1.00)

## 2019-10-05 MED ORDER — SODIUM CHLORIDE 0.9 % IJ SOLN
50 mL | Freq: Once | INTRAVENOUS | 0 refills | Status: CP
Start: 2019-10-05 — End: ?
  Administered 2019-10-05: 14:00:00 50 mL via INTRAVENOUS

## 2019-10-05 MED ORDER — IOHEXOL 350 MG IODINE/ML IV SOLN
65 mL | Freq: Once | INTRAVENOUS | 0 refills | Status: CP
Start: 2019-10-05 — End: ?
  Administered 2019-10-05: 14:00:00 65 mL via INTRAVENOUS

## 2019-10-08 ENCOUNTER — Encounter: Admit: 2019-10-08 | Discharge: 2019-10-08 | Payer: No Typology Code available for payment source

## 2019-10-08 DIAGNOSIS — R918 Other nonspecific abnormal finding of lung field: Secondary | ICD-10-CM

## 2019-10-29 ENCOUNTER — Encounter: Admit: 2019-10-29 | Discharge: 2019-10-29 | Payer: No Typology Code available for payment source

## 2019-11-01 ENCOUNTER — Encounter: Admit: 2019-11-01 | Discharge: 2019-11-01 | Payer: No Typology Code available for payment source

## 2019-11-01 MED ORDER — ATORVASTATIN 40 MG PO TAB
40 mg | ORAL_TABLET | Freq: Every day | ORAL | 1 refills | 90.00000 days | Status: DC
Start: 2019-11-01 — End: 2019-11-05

## 2019-11-03 ENCOUNTER — Encounter: Admit: 2019-11-03 | Discharge: 2019-11-03 | Payer: No Typology Code available for payment source

## 2019-11-03 NOTE — Telephone Encounter
Pharmacy contacted again regarding patient's atorvastatin. The prescription sent in was for 40 mg and patient states that she is taking 20 mg. Clarified originally with Dr. Kate Sable on 2/17 that patient should be on 40 mg dose. Pharmacy notified.

## 2019-11-04 ENCOUNTER — Encounter: Admit: 2019-11-04 | Discharge: 2019-11-04 | Payer: No Typology Code available for payment source

## 2019-11-05 MED ORDER — ATORVASTATIN 40 MG PO TAB
20 mg | ORAL_TABLET | Freq: Every day | ORAL | 1 refills | Status: AC
Start: 2019-11-05 — End: ?

## 2019-11-05 MED ORDER — MELOXICAM 15 MG PO TAB
15 mg | ORAL_TABLET | Freq: Every day | ORAL | 0 refills | 30.00000 days | Status: AC
Start: 2019-11-05 — End: ?

## 2019-12-27 ENCOUNTER — Encounter: Admit: 2019-12-27 | Discharge: 2019-12-27 | Payer: No Typology Code available for payment source

## 2019-12-28 ENCOUNTER — Ambulatory Visit: Admit: 2019-12-28 | Discharge: 2019-12-28 | Payer: No Typology Code available for payment source

## 2019-12-28 ENCOUNTER — Encounter: Admit: 2019-12-28 | Discharge: 2019-12-28 | Payer: No Typology Code available for payment source

## 2019-12-28 DIAGNOSIS — E785 Hyperlipidemia, unspecified: Secondary | ICD-10-CM

## 2019-12-28 LAB — LIPID PROFILE
Lab: 13 mg/dL
Lab: 137 mg/dL (ref <200)
Lab: 57 mg/dL (ref >40)
Lab: 66 mg/dL (ref <100)
Lab: 66 mg/dL (ref <150)
Lab: 80 mg/dL

## 2020-01-03 ENCOUNTER — Encounter: Admit: 2020-01-03 | Discharge: 2020-01-03 | Payer: No Typology Code available for payment source

## 2020-01-03 DIAGNOSIS — Z1231 Encounter for screening mammogram for malignant neoplasm of breast: Secondary | ICD-10-CM

## 2020-01-03 DIAGNOSIS — C50412 Malignant neoplasm of upper-outer quadrant of left female breast: Secondary | ICD-10-CM

## 2020-01-03 DIAGNOSIS — M1990 Unspecified osteoarthritis, unspecified site: Secondary | ICD-10-CM

## 2020-01-03 DIAGNOSIS — C50919 Malignant neoplasm of unspecified site of unspecified female breast: Secondary | ICD-10-CM

## 2020-01-03 DIAGNOSIS — J302 Other seasonal allergic rhinitis: Secondary | ICD-10-CM

## 2020-01-03 DIAGNOSIS — Z9189 Other specified personal risk factors, not elsewhere classified: Secondary | ICD-10-CM

## 2020-01-03 DIAGNOSIS — E785 Hyperlipidemia, unspecified: Secondary | ICD-10-CM

## 2020-01-03 NOTE — Progress Notes
Bioimpedance Spectroscopy performed. Advised patient that Normal result will be sent within 24 hours by mail or via Mychart (preferred). The patient will be contacted via phone by the lymphedema nurse with any abnormal results.

## 2020-01-03 NOTE — Progress Notes
Reviewed BIS testing results from today   ResultsL-Dex:0.2  Baseline6.0  Change from Baseline:-5.8   WNL less than 3 standard deviation increase from baseline.      Notified patient viaMyChart result was normal and to continue with routine follow up as scheduled.  Provided clinic contact information for any questions or concerns.

## 2020-01-03 NOTE — Progress Notes
Name: Alicia Sharp          MRN: [id number]      DOB: 1956/06/24      AGE: 64 y.o.   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 She is 5 months s/p left lumpectomy/SLNB for a grade 2, hormone +, Her2 negative, IDC. She denies any new palpable masses on self-breast exam and she has no other complaints today.     Cancer Staging  Malignant neoplasm of upper-outer quadrant of left breast in female, estrogen receptor positive (HCC)  Staging form: Breast, AJCC 8th Edition  - Clinical: Stage IA (cT1c, cN0, cM0, G2, ER+, PR+, HER2-) - Signed by Carylon Perches, APRN on 08/25/2018  - Pathologic: Stage IA (pT1c, pN0(sn), cM0, G2, ER+, PR+, HER2-) - Signed by Carylon Perches, APRN on 08/25/2018      History of Present Illness  History: Alicia Sharp is a female who presented to the [facility] on 07/16/2018 at age 24 for evaluation of her left breast cancer. Her screening mammograms in November 2019 showed a spiculated mss in the left breast and diagnosed mammogram redemonstrated the mass with suspicious calcifications. Ultrasound showed a 1.8 cm irregular mass corresponding with the mammogram finding and biopsy was recommended. Left breast biopsy 07/02/18 revealed a grade 2, IDC at 12:00. Left lumpectomy/SLNB 08/17/2018. Surgical pathology showed a 1.8 cm grade 2, IDC. There were 2 negative SLNs. She completed radiation therapy with Dr. Haskel Khan 10/12/2018 and she was started on Arimidex.     Breast Imaging:  Mammogram:   - Screening mammograms 06/30/18 [facility]) showed scattered fibroglandular tissue. There was large spiculated mass with surrounding architectural distortion at 12:00, 6-7 cm posterior to the nipple. No suspicious calcifications were seen.   - Left diagnostic mammogram 07/01/18 [facility] redemonstrated a left breast mass without accompanying suspicious calcification.   - Bilateral diagnostic mammogram 07/17/18 ([place]) revealed no suspicious masses, calcifications or architectural distortion present to suggest malignancy within the right breast. The left breast was significant for an irregular spiculated high density mass within the upper left breast at 12:00, middle depth measuring at least 1.7 cm. A biopsy clip is seen along the anterior superior margin of the mass. There are no associated calcifications. This is consistent with biopsy-proven invasive ductal carcinoma. There is a posteriorly displaced ribbon-shaped biopsy clip within the upper left breast at 12:00, far posterior depth. This biopsy clip is 3.5 cm posterior to the malignant mass. There is no additional suspicious mammographic finding seen within the left breast.    Ultrasound:   - Left breast ultrasound 07/01/18 [facility]) showed a 1.8 cm irregular hypoechoic ovoid mass at 12:00, 3 cm FTN corresponding with the abnormality seen on mammogram. No suspicious lymphadenopathy was seen in the axilla. BIRADS 5.   - Left breast ultrasound 07/17/18 ([place]) revealed biopsy-proven malignant left breast mass at 12:00, 3 cm from the nipple measuring up to 2.0 cm. No morphologically suspicious left axillary lymph nodes. No additional suspicious mammographic or sonographic finding within either breast.    Pertinent PMH: Negative  Family History: Maternal aunt had breast cancer, deceased at age 78    Reproductive health:  Age at Menarche:  87  Age at First Live Birth:  44  Age at Menopause:  72  Gravida: 3   Para: 2  Breastfeeding: 2 years    Medical Oncology: Dr. Biagio Borg  Present Therapy: Arimidex  Referred by: Gardiner Barefoot, ARNP            Review of Systems  Constitutional: Negative for fever, chills, appetite change and fatigue.   HENT: Negative for hearing loss, congestion, rhinorrhea and tinnitus.    Eyes: Negative for pain, discharge and itching.   Respiratory: Negative for cough, chest tightness and shortness of breath.    Cardiovascular: Negative for chest pain and palpitations.   Gastrointestinal: Negative for abdominal distention, pain, nausea, vomiting, and diarrhea.   Genitourinary: Negative for frequency, vaginal bleeding, difficulty urinating and pelvic pain.   Musculoskeletal: Negative for myalgias, back pain, joint swelling and arthralgias.   Skin: Negative for rash.   Neurological: Negative for dizziness, weakness, light-headedness and headaches.   Hematological: Does not bruise/bleed easily.   Psychiatric/Behavioral: Negative for disturbed wake/sleep cycle. The patient is not nervous/anxious.    No Known Allergies  The following medical/surgical/family/social history and the list of medications are current, as of 01/03/2020    Medical History:   Diagnosis Date   ? Arthritis    ? Breast cancer (HCC) 07/2018    left breast   ? Hyperlipidemia    ? Seasonal allergies      Surgical History:   Procedure Laterality Date   ? LEFT RADIOACTIVE SEED LOCALIZED LUMPECTOMY Left 08/17/2018    Performed by Ignatius Specking, MD at [facility]   ? IDENTIFICATION SENTINEL LYMPH NODE Left 08/17/2018    Performed by Ignatius Specking, MD at [facility]   ? INJECTION RADIOACTIVE TRACER FOR SENTINEL NODE IDENTIFICATION Left 08/17/2018    Performed by Ignatius Specking, MD at [facility]   ? LEFT SENTINEL LYMPH NODE BIOPSY Left 08/17/2018    Performed by Ignatius Specking, MD at [facility]   ? COLONOSCOPY DIAGNOSTIC WITH SPECIMEN COLLECTION BY BRUSHING/ WASHING - FLEXIBLE N/A 04/07/2019    Performed by Onnie Boer, MD at [facility]   ? HX ROTATOR CUFF REPAIR      R 08/2010. L 09/2015.   ? HX WISDOM TEETH EXTRACTION     ? KNEE ARTHROSCOPY Left     2014     Family History   Problem Relation Age of Onset   ? Migraines Mother    ? Heart Disease Father    ? Stroke Father    ? Coronary Artery Disease Father ? Cancer-Breast Maternal Aunt         30s   ? Cancer-Prostate Maternal Grandfather    ? Migraines Maternal Grandfather      Social History     Socioeconomic History   ? Marital status: Widowed     Spouse name: Not on file   ? Number of children: 2   ? Years of education: Not on file   ? Highest education level: Not on file   Occupational History   ? Occupation: retired   Tobacco Use   ? Smoking status: Never Smoker   ? Smokeless tobacco: Never Used   ? Tobacco comment: briefly in college   Substance and Sexual Activity   ? Alcohol use: Yes     Alcohol/week: 5.0 standard drinks     Types: 5 Glasses of wine per week     Comment: 1 glass  of wine/evening   ? Drug use: Never   ? Sexual activity: Not on file   Other Topics Concern   ? Not on file   Social History Narrative    Here with daughter Denny Peon     Objective:         ? anastrozole (ARIMIDEX) 1 mg tablet Take one tablet by mouth daily.   ? atorvastatin (LIPITOR) 40 mg tablet Take one-half tablet by mouth daily.   ? cholecalciferol (VITAMIN D-3) 1,000 units tablet    ? meloxicam (MOBIC) 15 mg tablet TAKE ONE TABLET BY MOUTH DAILY.     Vitals:    01/03/20 1431   BP: 122/55   BP Source: Arm, Left Upper   Patient Position: Sitting   Pulse: 65   Temp: 36.6 ?C (97.8 ?F)   TempSrc: Temporal   SpO2: 98%   Weight: 83.6 kg (184 lb 3.2 oz)   Height: 160 cm (63)   PainSc: Zero     Body mass index is 32.63 kg/m?Marland Kitchen     Pain Score: Zero       Fatigue Scale: 0-None    Pain Addressed:  N/A    Patient Evaluated for a Clinical Trial: Patient currently enrolled in a [place] treatment clinical trial.     Eastern Cooperative Oncology Group performance status is 0, Fully active, able to carry on all pre-disease performance without restriction.Marland Kitchen     Physical Exam  Vitals reviewed.   Chest:         Constitutional: No acute distress.  HEENT:  Head: Normocephalic and atraumatic.  Eyes: No discharge. No scleral icterus.  Pulmonary/Chest: No respiratory distress.   Lymphadenopathy: There is no axillary, infraclavicular, or supraclavicular adenopathy.  Neurological: Alert and oriented to person, place and time. No cranial nerve deficit.  Skin: Warm and dry. No rash noted. No erythema. No pallor.  Psychiatric: Normal mood and affect. Behavior is normal. Judgement and thought content normal.            Assessment and Plan:  65 yo female 1.5 years s/p left lumpectomy/SLNB for left grade 2, IDC (ER100%, PR95%, Her2 1+, Ki-67 4%), Stage IA (pT1c, pN0(sn), cM0)  ?  Ms. Hetu is doing well. There are no signs of local or regional recurrence on clinical exam and bilateral diagnostic mammograms 07/02/2019 were BIRAD 2. 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 She has done both in the past and will likely start wearing a compression bra and performing massage again. She is on Arimidex and continues to follow with Dr. Biagio Borg. We will plan to see her back in 6 months when her screening mammograms are due. We will continue to monitor for lymphedema with BIS at the time of her returns visits.?Ms. Mcbain was given ample time to have all of her questions answered.   ?  PLAN:  Continue follow-up with Dr. Biagio Borg  RTC in 6 months to see APP with BIS and screening mammograms    Dianna Rossetti, APRN-NP

## 2020-01-04 ENCOUNTER — Encounter: Admit: 2020-01-04 | Discharge: 2020-01-04 | Payer: No Typology Code available for payment source

## 2020-03-03 ENCOUNTER — Encounter: Admit: 2020-03-03 | Discharge: 2020-03-03 | Payer: No Typology Code available for payment source

## 2020-03-03 MED ORDER — ANASTROZOLE 1 MG PO TAB
1 mg | ORAL_TABLET | Freq: Every day | ORAL | 2 refills | 33.00000 days | Status: AC
Start: 2020-03-03 — End: ?

## 2020-04-10 ENCOUNTER — Encounter: Admit: 2020-04-10 | Discharge: 2020-04-10 | Payer: No Typology Code available for payment source

## 2020-04-10 ENCOUNTER — Ambulatory Visit: Admit: 2020-04-10 | Discharge: 2020-04-11 | Payer: No Typology Code available for payment source

## 2020-04-10 DIAGNOSIS — M25569 Pain in unspecified knee: Secondary | ICD-10-CM

## 2020-04-10 DIAGNOSIS — E7800 Pure hypercholesterolemia, unspecified: Secondary | ICD-10-CM

## 2020-04-10 DIAGNOSIS — J302 Other seasonal allergic rhinitis: Secondary | ICD-10-CM

## 2020-04-10 DIAGNOSIS — R202 Paresthesia of skin: Secondary | ICD-10-CM

## 2020-04-10 DIAGNOSIS — M1990 Unspecified osteoarthritis, unspecified site: Secondary | ICD-10-CM

## 2020-04-10 DIAGNOSIS — C50412 Malignant neoplasm of upper-outer quadrant of left female breast: Secondary | ICD-10-CM

## 2020-04-10 DIAGNOSIS — C50919 Malignant neoplasm of unspecified site of unspecified female breast: Secondary | ICD-10-CM

## 2020-04-10 DIAGNOSIS — Z0000 Encounter for general adult medical examination without abnormal findings: Secondary | ICD-10-CM

## 2020-04-10 DIAGNOSIS — D649 Anemia, unspecified: Secondary | ICD-10-CM

## 2020-04-10 DIAGNOSIS — E785 Hyperlipidemia, unspecified: Secondary | ICD-10-CM

## 2020-04-10 MED ORDER — ANASTROZOLE 1 MG PO TAB
1 mg | ORAL_TABLET | Freq: Every day | ORAL | 2 refills | Status: CN
Start: 2020-04-10 — End: ?

## 2020-04-10 NOTE — Patient Instructions
Please don't hesitate to call if you have any problems or questions. My nurse CB can be reached at [phone].      If CB does not answer, please leave a voicemail. Out of respect for patient/call volume, please only call once in a 24 hour period and my nurse will return your call as soon as he can.  You may also message [place] in MyChart. Please note that it may take up to two business days to reply to [employer].     For refills on medications, please have your pharmacy fax a refill authorization request form to our office at Fax: [phone]. Please allow at least 3 business days for refill requests.     For urgent issues after business hours/weekends/holidays call [phone] and request for the outpatient internal medicine physician to be paged.    We offer same day appointments for your acute health concerns. These appointments are on a first come, first serve basis. Please call [phone] if you would like to make an appointment. If I am not available, you can see any of my partners or try to see me the next day (call at 8am).      Take care,     Dr. Avelino Leeds, Dr.Huded, and [facility]    Future Appointments   Date Time Provider Department Center   04/18/2020  3:15 PM O'Dea, Daneil Dan, MD [id number] [place] Exam   07/03/2020 10:00 AM MAMMO - IC ROOM 1 IC1MAMM [facility] Radiolog   07/03/2020 10:30 AM Dianna Rossetti, APRN-NP [id number] [place] Exam   07/03/2020 10:30 AM [facility] IC1 ? BIOIMPEDENCE SPECTROSCOPY [facility] [place] Exam

## 2020-04-10 NOTE — Progress Notes
During the COVID-19 pandemic, the supervising physician and/or patient was not physically present with the resident at the time of this telehealth encounter. I concurrently monitored the patient care through appropriate telecommunication technology for this telehealth encounter.     I discussed the care of Ms.Morlock with Dr. Denny Levy, MD at the time of the visit, and I agree with the evaluation and plan as documented by this resident, unless noted below, or in the resident's note in bold, blue italics.    - will get CBC, smear, iron studies in Oct 2021  - needs repeat CT scan in 18-24 months after last one  - continue lipitor 20  - back in 6 months for physical       Encounter Diagnoses   Name Primary?   ? Normocytic anemia Yes   ? Malignant neoplasm of upper-outer quadrant of left breast in female, estrogen receptor positive (HCC)    ? Health care maintenance    ? Pure hypercholesterolemia    ? Malignant neoplasm of female breast, unspecified estrogen receptor status, unspecified laterality, unspecified site of breast (HCC)    ? Chronic knee pain, unspecified laterality    ? Paresthesias         Orders Placed This Encounter   ? CBC AND DIFF   ? PERIPHERAL SMEAR   ? IRON + BINDING CAPACITY + %SAT+ FERRITIN   ? HIV 1& 2 AG-AB SCRN W REFLEX HIV 1 PCR QUANT     Return in about 6 months (around 10/09/2020) for In-Person, 6-9 months, annual physical. .  Future Appointments   Date Time Provider Department Center   04/18/2020  3:15 PM O'Dea, Daneil Dan, MD [id number] [place] Exam   07/03/2020 10:00 AM MAMMO - IC ROOM 1 IC1MAMM ICC Radiolog   07/03/2020 10:30 AM Dianna Rossetti, APRN-NP [facility] [place] Exam   07/03/2020 10:30 AM BIS IC[facility] ? BIOIMPEDENCE SPECTROSCOPY [email or web address] Exam     Charlyne Mom, MD

## 2020-04-10 NOTE — Progress Notes
Telehealth Visit Note    Date of Service: 04/10/2020    Subjective:      Obtained patient's verbal consent to treat them and their agreement to [facility] financial policy and NPP via this telehealth visit during the [facility] Emergency       Alicia Sharp is a 64 y.o. female.    History of Present Illness  Alicia Sharp presents today for follow up regarding elevated cholesterol. She has been compliant with atorvastatin 20 mg. She is hoping to go to [place] on a medical mission trip later this year. She has completed her COVID vaccination, but is unsure about getting the booster dose. 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 She has been working out more consistently. She walks about 10,000 steps a day. She has been doing the [employer] app to help with her diet. She feels that she is slowly losing weight. 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     She is having a little pain in her left wrist. Pain started 7/28. She was walking fast and ran into the bathroom door frame. She has not noticed any swelling, bruising. Pain is not constant. Is unable to palpate any abnormalities. Pain does not affecting her quality of life in her opinion. She also had an event where she tripped over a puppy crate. She hit her left knee 8/21. She has a little bit of bruising. No swelling. She said that if it continues to bother her, she will come to her orthopedic surgeon or come visit the IM clinic in person.     Alicia Sharp is living on her own with her dog. She is trying to stay away from crowded areas. Intermittently visiting her grandchildren.        Review of Systems   Constitutional: Negative for chills, diaphoresis, fatigue and fever.   HENT: Negative for congestion and sore throat.    Respiratory: Negative for cough.    Cardiovascular: Negative for chest pain.   Gastrointestinal: Negative for abdominal pain, nausea and vomiting.   Musculoskeletal: Negative for arthralgias, joint swelling, myalgias and neck pain.   Skin: Negative for rash.   Neurological: Negative for weakness, numbness and headaches.         Objective:         ? anastrozole (ARIMIDEX) 1 mg tablet TAKE ONE TABLET BY MOUTH DAILY.   ? atorvastatin (LIPITOR) 40 mg tablet Take one-half tablet by mouth daily.   ? cholecalciferol (VITAMIN D-3) 1,000 units tablet    ? meloxicam (MOBIC) 15 mg tablet TAKE ONE TABLET BY MOUTH DAILY.     Vitals:    04/10/20 1307   Weight: 75.8 kg (167 lb)   Height: 161.3 cm (63.5)   PainSc: Zero     Body mass index is 29.12 kg/m?Marland Kitchen         Physical Exam  Vitals and nursing note reviewed.   Pulmonary:      Breath sounds: No wheezing.   Psychiatric:         Mood and Affect: Mood normal.         Thought Content: Thought content normal.              Assessment and Plan:  Alicia Sharp?is a 64 y.o.?female?with history of breast cancer who presents today?for 20-month follow-up. We will decrease atorvastatin to 20 mg as her cholesterol has improved. Will order repeat lipid panel in 3 months.  If she continues to have good control, will consider discontinuing atorvastatin. Return to clinic in 6 months. ?  ?  Health Maintenance  -?Cervical Cancer Screening: last pap smear unknown. History of irregular pap smears.   - She reports history of abnormal pap smears,?reported normal pap smear in 2007. Pap smear 01/18/2019 wnl, neg high-risk HPV  - Diabetes Screening: A1c?5.3 on 12/01/2018  - Chlamydia, Gonorrhea Screening: declined  - Colonoscopy 04/07/2019 without polyps. Repeat in?/10 years  -?Shingles vaccine?administered on 6/8, 2nd ordered?  - Completed COVID vaccine - first dose 10/14/2019, last dose 11/11/2019, Pfizer  Plan:  > Physical exam at next visit.   > HIV ordered.   ?  Coronary Artery Disease  Hyperlipidemia  - Calcium score 22 on 5/11; 4/21 total cholesterol 218, LDL 143  -?vitamin D3 and folic acid by her oncologist.?  - Repeat Lipid panel 05/2019 with cholesterol improved to 168  - CT on 10/05/2019 with CAD  - Repeat Lipid panel 12/28/2019 with cholesterol improved to 137, LDL 66 on atorvastatin 20 mg   Plan:  > Continue atorvastatin 20 mg.   ?  Pulmonary Nodules  - CT scan 5/11?pulmonary nodules and post-surgical changes in her breast on her CT.  -?Given her history of breast cancer,?notified?medical, surgical, and radiation oncologists   -?Repeat CT scan in?3 months with stable nodules.   - CT on 09/2019 with unchanged sub centimeter left lung nodules since 12/2018. F/u in 18-24 months.    ?  Normocytic Anemia  - Hemoglobin 11.8, MCV 94.2 on 12/01/2018; Folic acid > 22.3, Vit B12 161  - Iron studies 05/2019 with iron 74, % sat 20, TIBC 362, ferritin 12  Plan:  > No overt signs of bleeding. Colonoscopy 8/26 without polyps.  > Peripheral smear 06/02/2019 with no qualitative morphologic abnormalities. Repeat CBC, peripheral smear 05/2020  ?  Wrist Pain  - started end of July, supportive care for now - ice, ibuprofen. Advised her to let us know if the pain does not resolve.     Chronic Knee Pain  - Following with orthopedic surgery  ?  Breast Cancer  -?s/p lumpectomy and RT. She follows with Dr. Biagio Borg  - on anastrazole therapy.?    Paresthesias;?Numbess and Tingling  Morton's Neuroma    Patient discussed with Dr. Monte Fantasia.    Denny Levy, M.D.  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 is a female who presented to the [facility] on 07/16/2018 at age 59 for evaluation of her left breast cancer. Her screening mammograms in November 2019 showed a spiculated mss in the left breast and diagnosed mammogram redemonstrated the mass with suspicious calcifications. Ultrasound showed a 1.8 cm irregular mass corresponding with the mammogram finding and biopsy  was recommended. Left breast biopsy 07/02/18 revealed a grade 2, IDC at 12:00. Left lumpectomy/SLNB 08/17/2018. Surgical pathology showed a 1.8 cm grade 2, IDC. There were 2 negative SLNs. She completed radiation therapy with Dr. Haskel Khan 10/12/2018 and she was started on Arimidex.     Breast Imaging:  Mammogram:   - Screening mammograms 06/30/18 [facility]) showed scattered fibroglandular tissue. There was large spiculated mass with surrounding architectural distortion at 12:00, 6-7 cm posterior to the nipple. No suspicious calcifications were seen.   - Left diagnostic mammogram 07/01/18 [facility]) redemonstrated a left breast mass without accompanying suspicious calcification.   - Bilateral diagnostic mammogram 07/17/18 ([place]) revealed no suspicious masses, calcifications or architectural distortion present to suggest malignancy within the right breast. The left breast was significant for an irregular spiculated high density mass within the upper left breast at 12:00, middle depth measuring at least 1.7 cm. A biopsy clip is seen along the anterior superior margin of the mass. There are no associated calcifications. This is consistent with biopsy-proven invasive ductal carcinoma. There is a posteriorly displaced ribbon-shaped biopsy clip within the upper left breast at 12:00, far posterior depth. This biopsy clip is 3.5 cm posterior to the malignant mass. There is no additional suspicious mammographic finding seen within the left breast.    Ultrasound:   - Left breast ultrasound 07/01/18 [facility]) showed a 1.8 cm irregular hypoechoic ovoid mass at 12:00, 3 cm FTN corresponding with the abnormality seen on mammogram. No suspicious lymphadenopathy was seen in the axilla. BIRADS 5.   - Left breast ultrasound 07/17/18 ([place]) revealed biopsy-proven malignant left breast mass at 12:00, 3 cm from the nipple measuring up to 2.0 cm. No morphologically suspicious left axillary lymph nodes. No additional suspicious mammographic or sonographic finding within either breast.    Pertinent PMH: Negative  Family History: Maternal aunt had breast cancer, deceased at age 56    Reproductive health:  Age at Menarche:  36  Age at First Live Birth:  85  Age at Menopause:  55  Gravida: 3   Para: 2  Breastfeeding: 2 years    Medical Oncology: Dr. Biagio Borg  Present Therapy: Arimidex  Referred by: Gardiner Barefoot, ARNP                             60 minutes spent on this patient's encounter with counseling and coordination of care taking >50% of the visit.

## 2020-04-13 ENCOUNTER — Encounter: Admit: 2020-04-13 | Discharge: 2020-04-13 | Payer: No Typology Code available for payment source

## 2020-04-14 NOTE — Patient Instructions
Thank you for coming to see us today.   Please call our office or send a message through MyChart if you have any questions or concerns.          Dr. Anne O'Dea and Michelle Prager, APRN  [phone] (Nurses: Tara Gardner/Michele Kuester/Kierrah Kilbride Deviney)  [phone] (fax)    [facility] Location  [address]  [place], [place] [address]    [facility] Location  [address]  [place], [place] [address]

## 2020-04-18 ENCOUNTER — Encounter: Admit: 2020-04-18 | Discharge: 2020-04-18 | Payer: No Typology Code available for payment source

## 2020-04-18 DIAGNOSIS — Z79811 Long term (current) use of aromatase inhibitors: Secondary | ICD-10-CM

## 2020-04-18 DIAGNOSIS — E785 Hyperlipidemia, unspecified: Secondary | ICD-10-CM

## 2020-04-18 DIAGNOSIS — M1990 Unspecified osteoarthritis, unspecified site: Secondary | ICD-10-CM

## 2020-04-18 DIAGNOSIS — Z780 Asymptomatic menopausal state: Secondary | ICD-10-CM

## 2020-04-18 DIAGNOSIS — J302 Other seasonal allergic rhinitis: Secondary | ICD-10-CM

## 2020-04-18 DIAGNOSIS — C50919 Malignant neoplasm of unspecified site of unspecified female breast: Secondary | ICD-10-CM

## 2020-04-18 DIAGNOSIS — C50412 Malignant neoplasm of upper-outer quadrant of left female breast: Secondary | ICD-10-CM

## 2020-04-18 MED ORDER — EXEMESTANE 25 MG PO TAB
25 mg | ORAL_TABLET | Freq: Every day | ORAL | 1 refills | 90.00000 days | Status: AC
Start: 2020-04-18 — End: ?

## 2020-05-12 ENCOUNTER — Ambulatory Visit: Admit: 2020-05-12 | Discharge: 2020-05-12 | Payer: MEDICARE

## 2020-05-12 ENCOUNTER — Encounter: Admit: 2020-05-12 | Discharge: 2020-05-12 | Payer: MEDICARE

## 2020-05-12 DIAGNOSIS — D509 Iron deficiency anemia, unspecified: Secondary | ICD-10-CM

## 2020-05-12 DIAGNOSIS — Z0000 Encounter for general adult medical examination without abnormal findings: Secondary | ICD-10-CM

## 2020-05-12 DIAGNOSIS — D649 Anemia, unspecified: Secondary | ICD-10-CM

## 2020-05-12 LAB — CBC AND DIFF
Lab: 0 10*3/uL (ref 0–0.20)
Lab: 0.1 10*3/uL (ref 0–0.45)
Lab: 0.3 10*3/uL (ref 0–0.80)
Lab: 1 % (ref 0–2)
Lab: 1.2 10*3/uL (ref 1.0–4.8)
Lab: 11 g/dL — ABNORMAL LOW (ref 12.0–15.0)
Lab: 12 % (ref 11–15)
Lab: 2.9 10*3/uL (ref 1.8–7.0)
Lab: 254 10*3/uL (ref 150–400)
Lab: 26 % (ref 24–44)
Lab: 3.7 M/UL — ABNORMAL LOW (ref 4.0–5.0)
Lab: 31 pg (ref 26–34)
Lab: 33 g/dL (ref 32.0–36.0)
Lab: 35 % — ABNORMAL LOW (ref 36–45)
Lab: 4 % (ref 0–5)
Lab: 4.7 10*3/uL (ref 4.5–11.0)
Lab: 61 % (ref 41–77)
Lab: 8 % (ref 4–12)
Lab: 8 FL (ref 7–11)
Lab: 95 FL (ref 80–100)

## 2020-05-12 LAB — IRON + BINDING CAPACITY + %SAT+ FERRITIN
Lab: 14 % — ABNORMAL LOW (ref 28–42)
Lab: 25 ng/mL (ref 10–200)
Lab: 337 ug/dL (ref 270–380)
Lab: 48 ug/dL — ABNORMAL LOW (ref 50–160)

## 2020-05-12 LAB — HIV 1& 2 AG-AB SCRN W REFLEX HIV 1 PCR QUANT

## 2020-05-12 MED ORDER — FERROUS SULFATE 325 MG (65 MG IRON) PO TAB
325 mg | ORAL_TABLET | Freq: Every day | ORAL | 0 refills | Status: AC
Start: 2020-05-12 — End: ?

## 2020-05-12 NOTE — Progress Notes
This result was communicated via MyChart:  Hi Alicia Sharp,  Your labwork does show that you are slightly anemic with low iron.  I'd like you to start over the counter iron (ferrous sulfate) 1 tab daily.  It can cause constipation and black stools (just as a warning) so you can reduce to every other day if needed.    Dr. Monte Fantasia (working with Dr. Avelino Leeds)

## 2020-05-16 ENCOUNTER — Encounter: Admit: 2020-05-16 | Discharge: 2020-05-16 | Payer: MEDICARE

## 2020-05-23 IMAGING — MG MAMMOGRAM, DIGITAL DX, LEFT
1 series · 5 of 5 positions shown · non-contrast
Comparison: none

[Series 2: L CC · left · 5 of 5 slices shown]
[im 1/5]
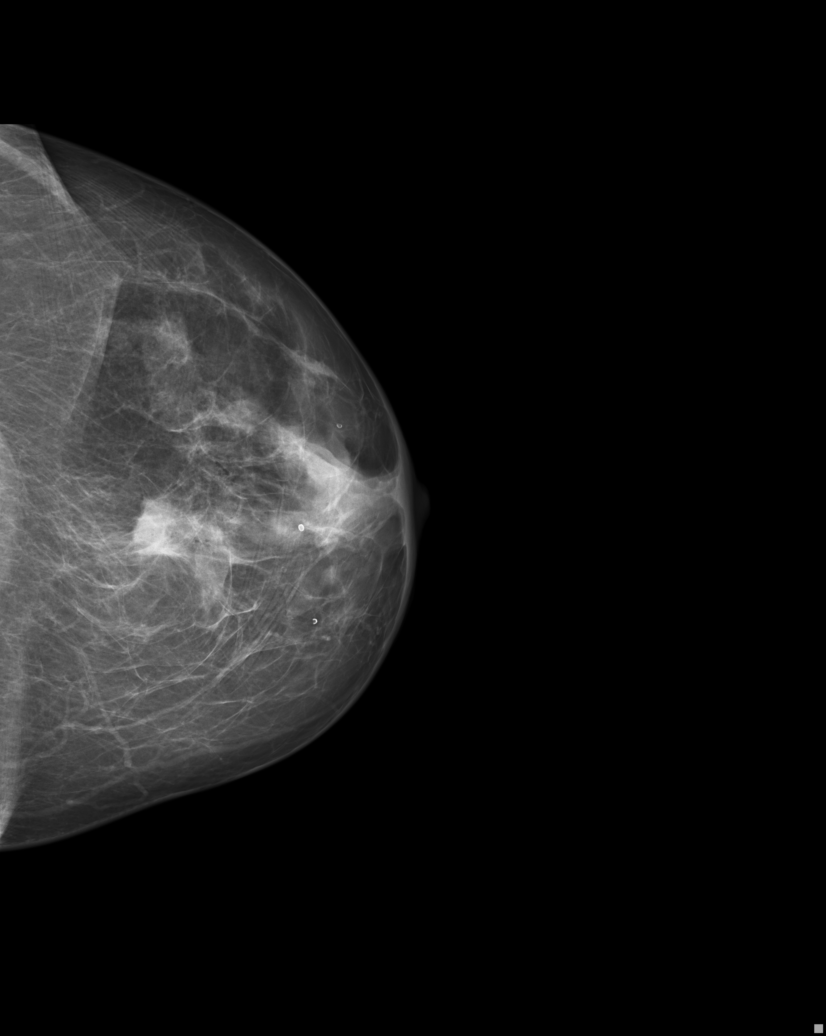
[im 2/5]
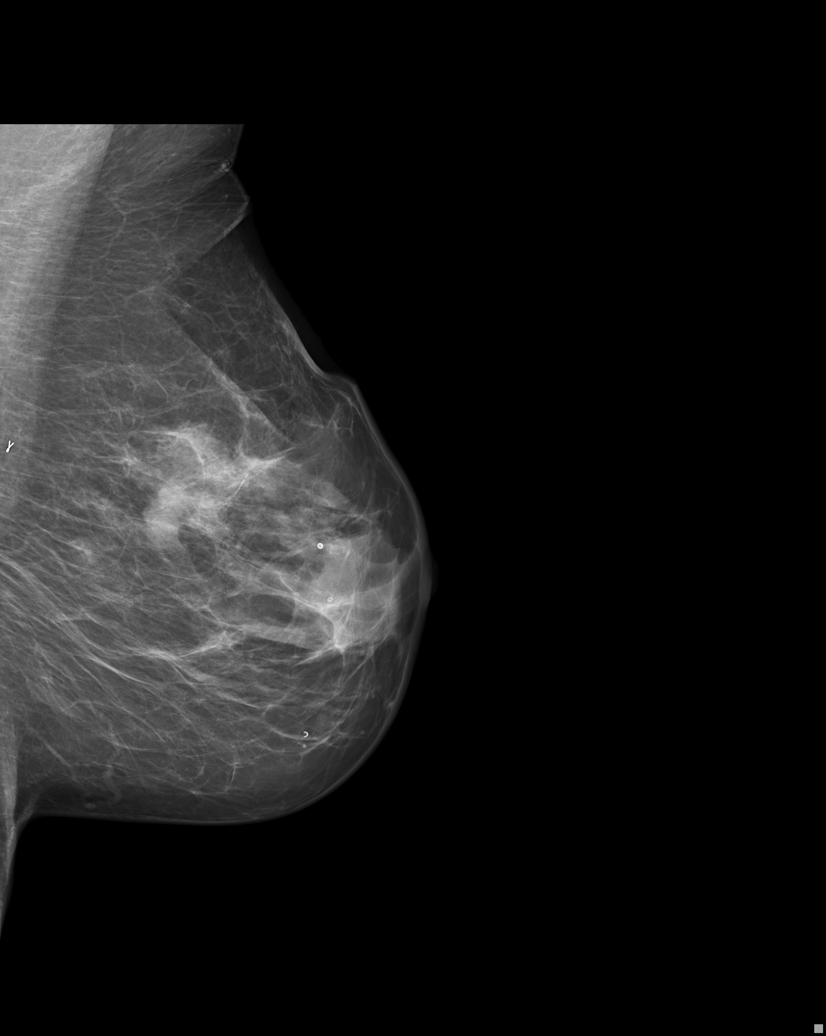
[im 3/5]
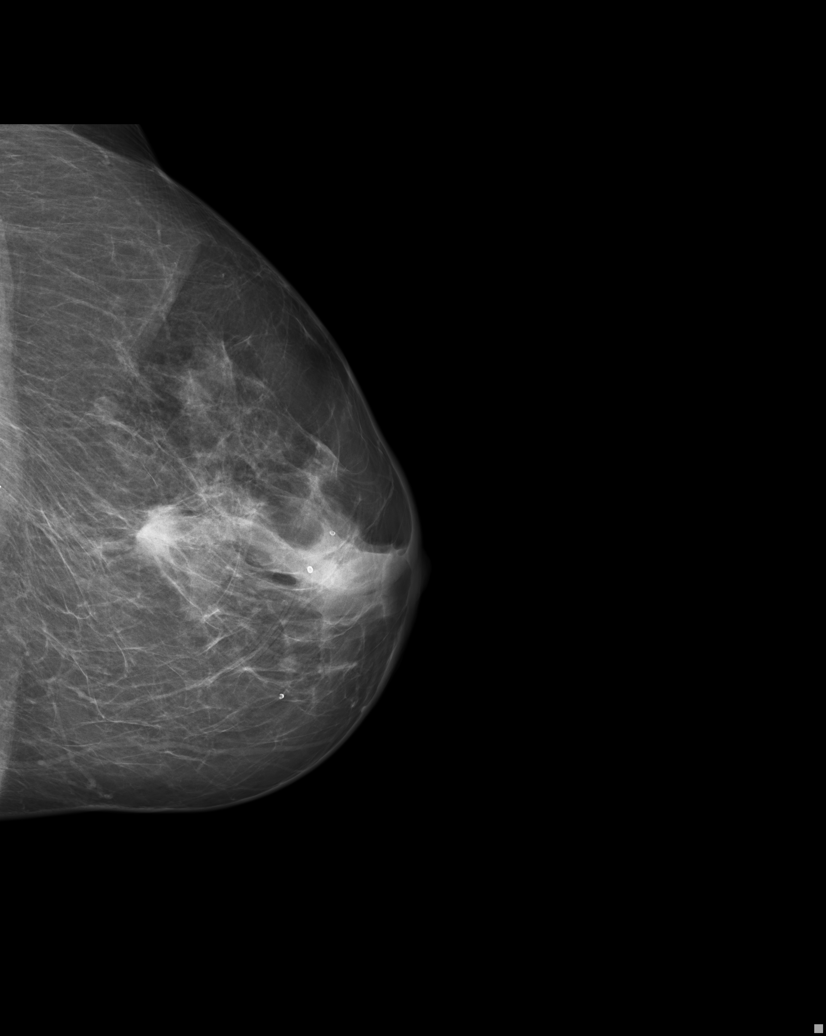
[im 4/5]
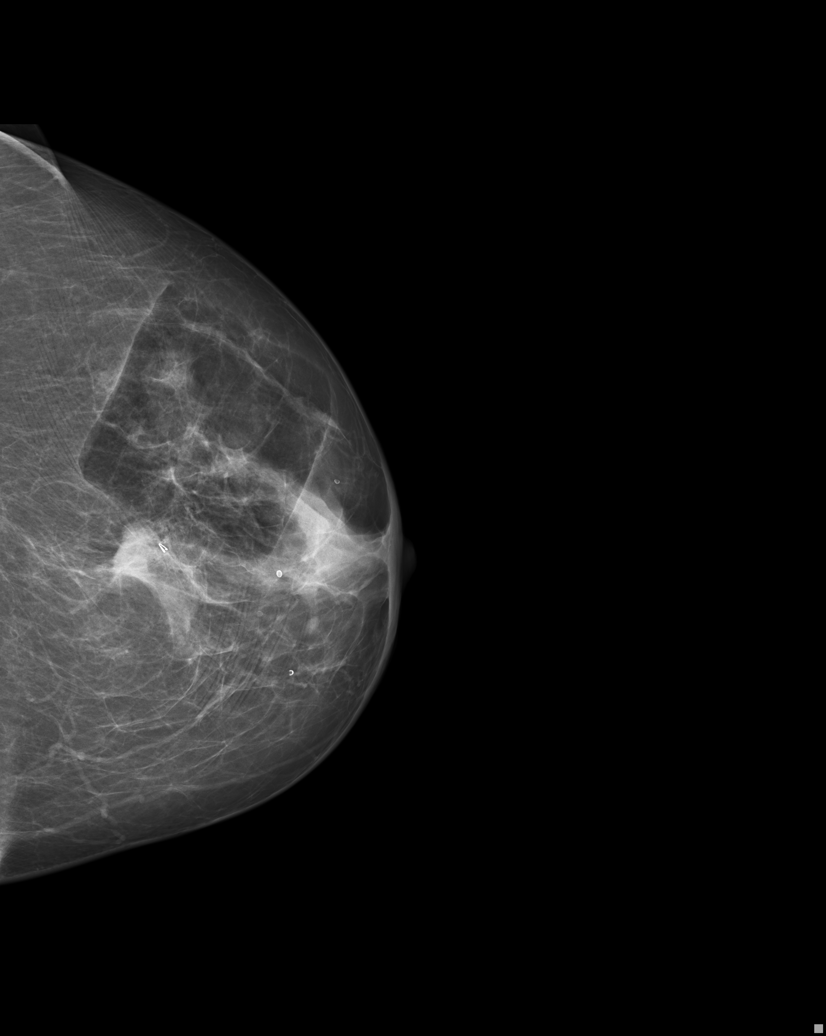
[im 5/5]
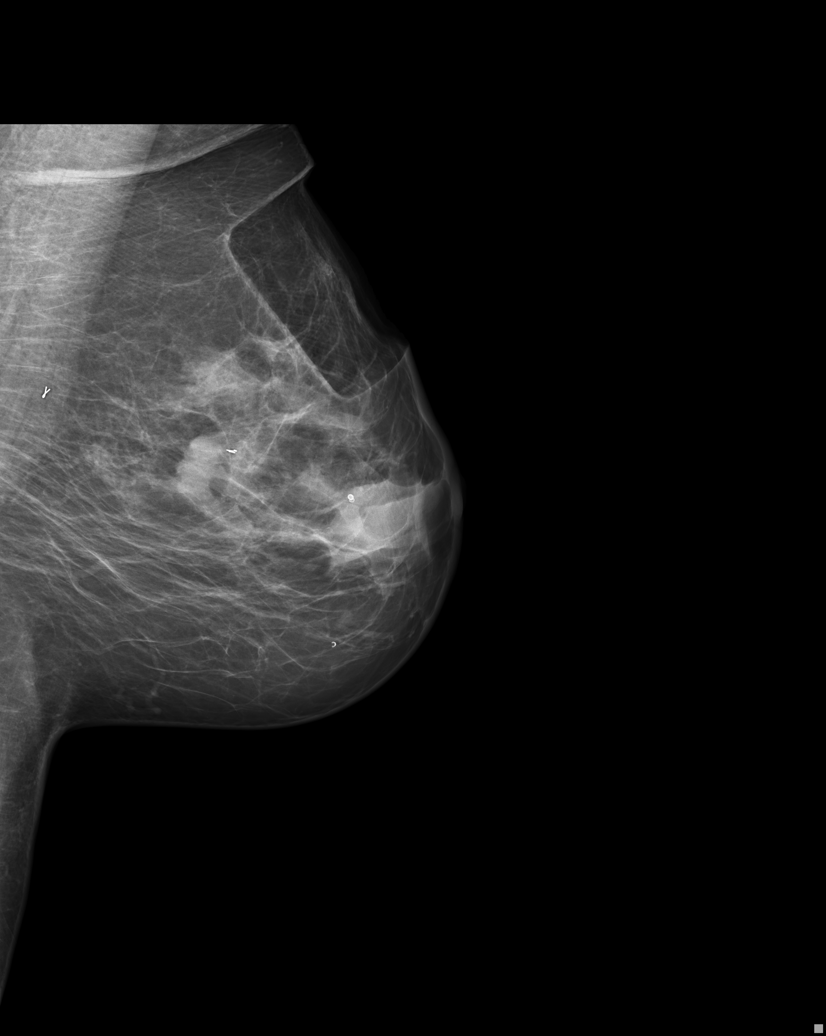

[5 of 5 positions shown; findings below may reference images not displayed]

DIAGNOSTIC STUDIES

EXAM
Left mammogram status post ultrasound-guided biopsy.

INDICATION
left breast mass
PORT PROCEDURE LT BREAST BX. LM

TECHNIQUE
Standard left mammogram was obtained.

COMPARISONS
July 01, 2018

FINDINGS
ACR Type 2: 25-50% There are scattered fibroglandular densities.
Initial films demonstrate posterior migration of the clip. Patient was brought back to the
ultrasound suite and 2nd clip deployed. This demonstrates of good placement adjacent to the biopsied
mass. Patient tolerated procedure well.

IMPRESSION
Clip placement status post ultrasound-guided biopsy. Please see above.

Tech Notes:

## 2020-05-23 IMAGING — US BXBRST
2 series · 13 of 13 positions shown · non-contrast
Comparison: none

[Series 1: us guided breast bx wo/w clip · 2 of 2 slices shown (1 of 2)]
[im 1/2]
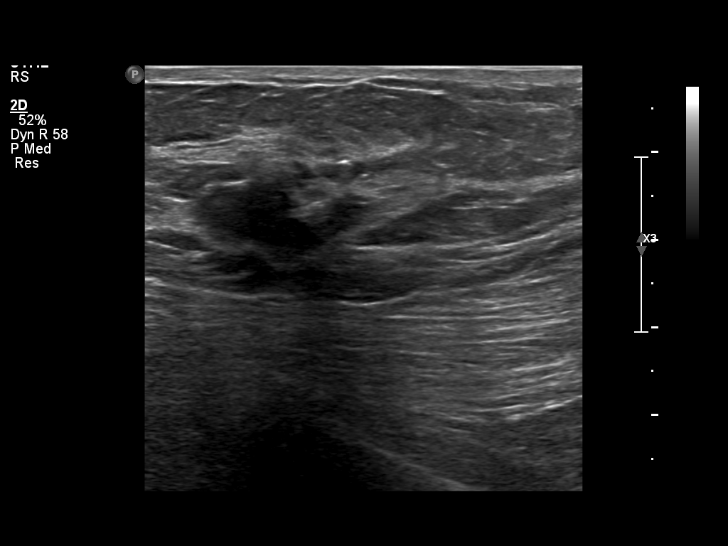
[im 2/2]
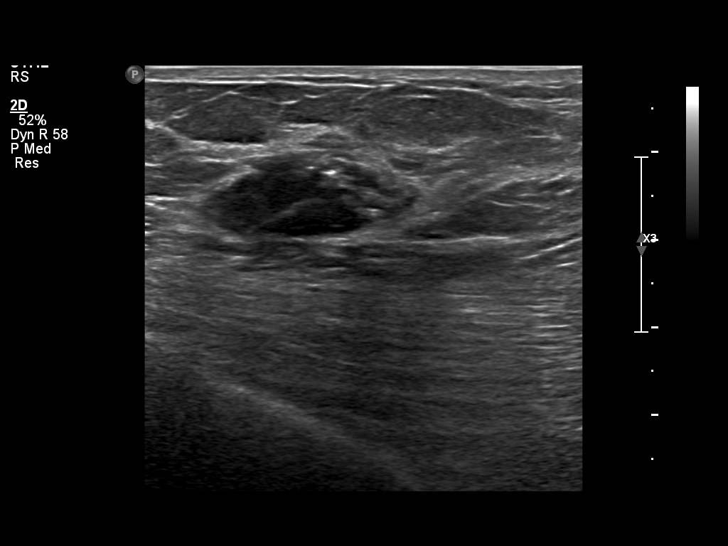

[Series 1: us guided breast bx wo/w clip · 11 of 11 slices shown (2 of 2)]
[im 1/11]
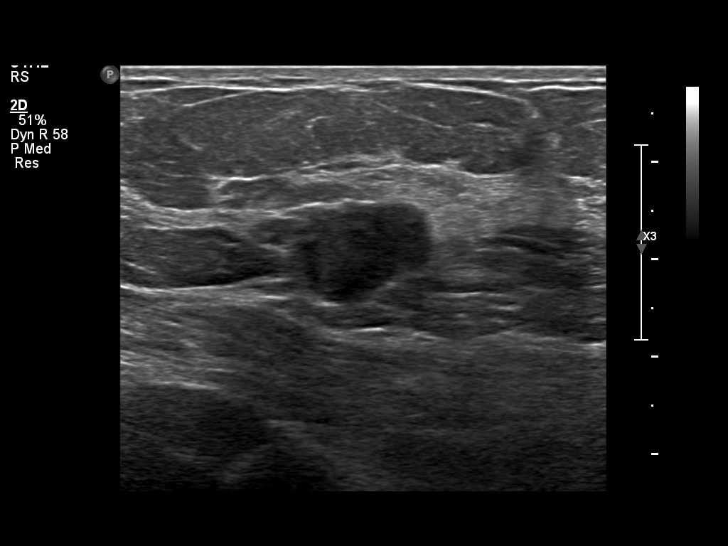
[im 2/11]
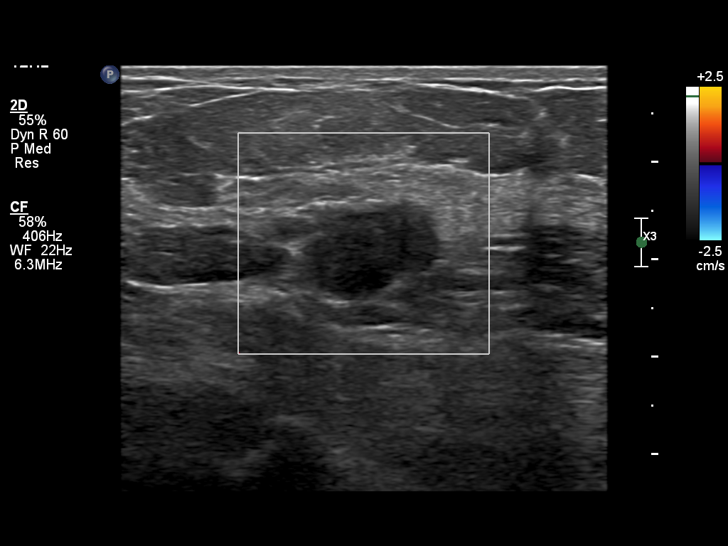
[im 3/11]
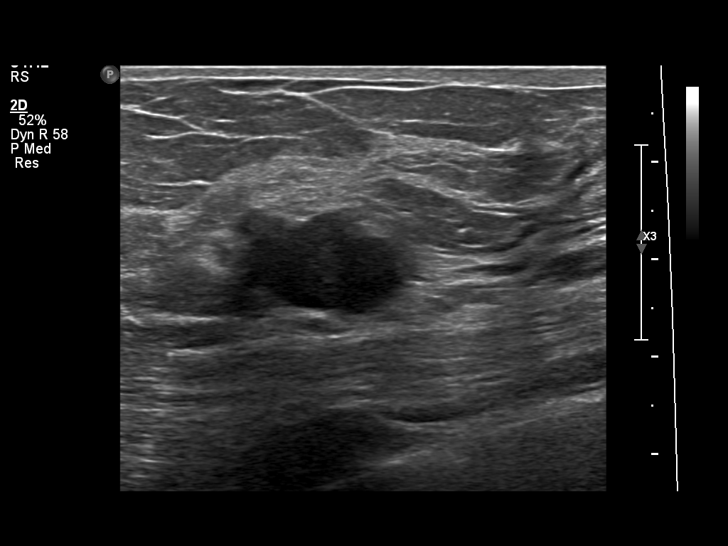
[im 4/11]
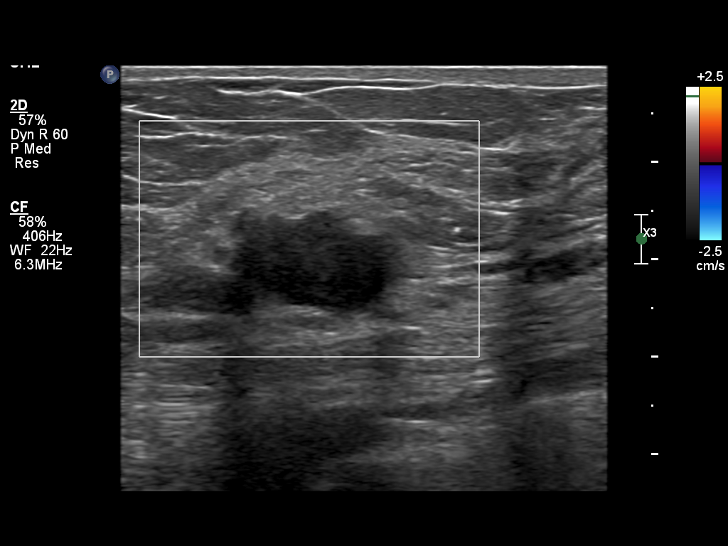
[im 5/11]
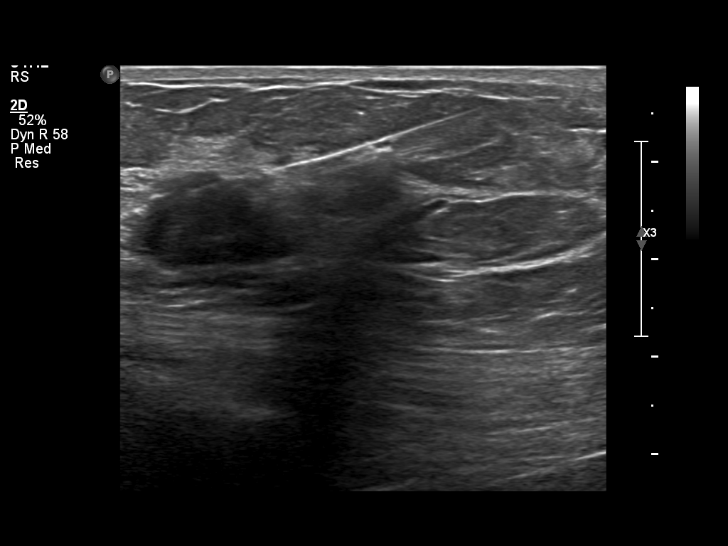
[im 6/11]
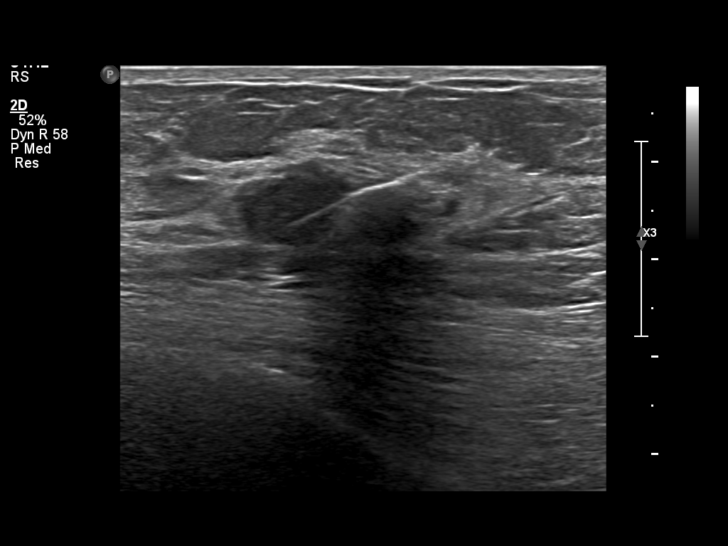
[im 7/11]
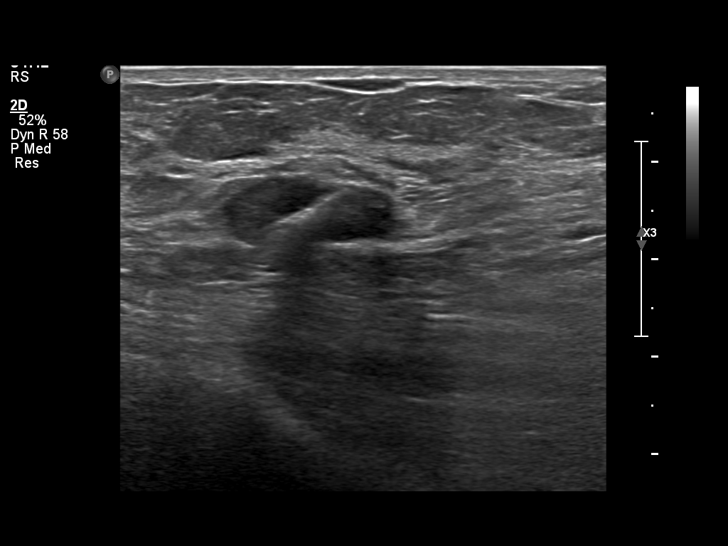
[im 8/11]
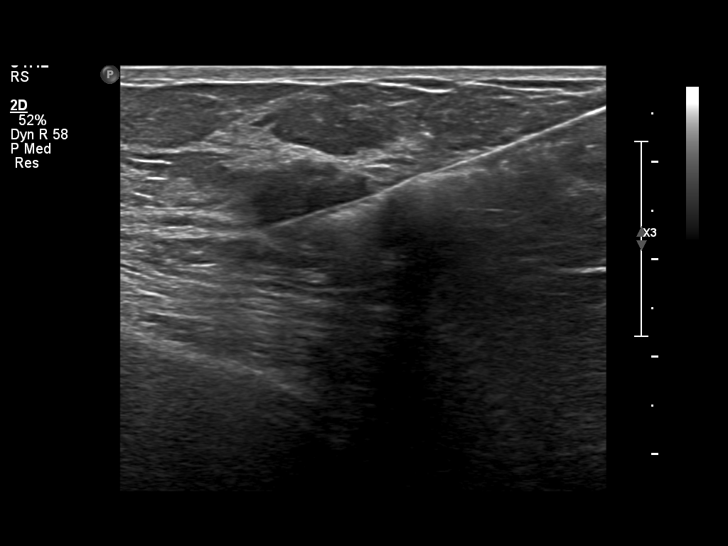
[im 9/11]
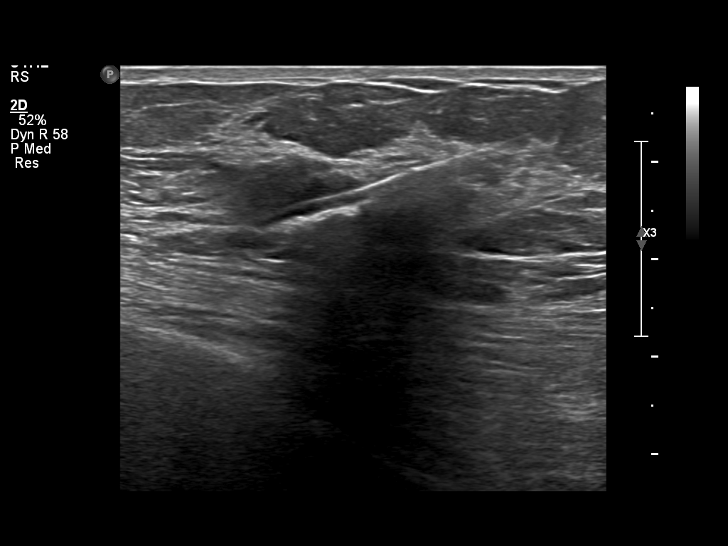
[im 10/11]
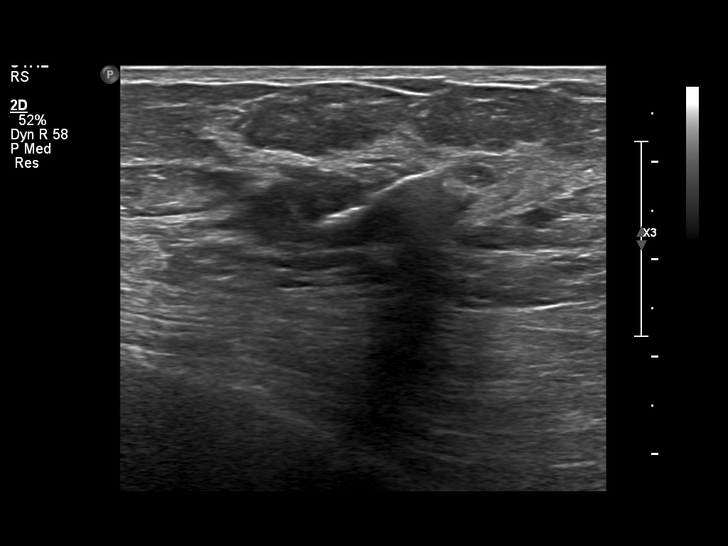
[im 11/11]
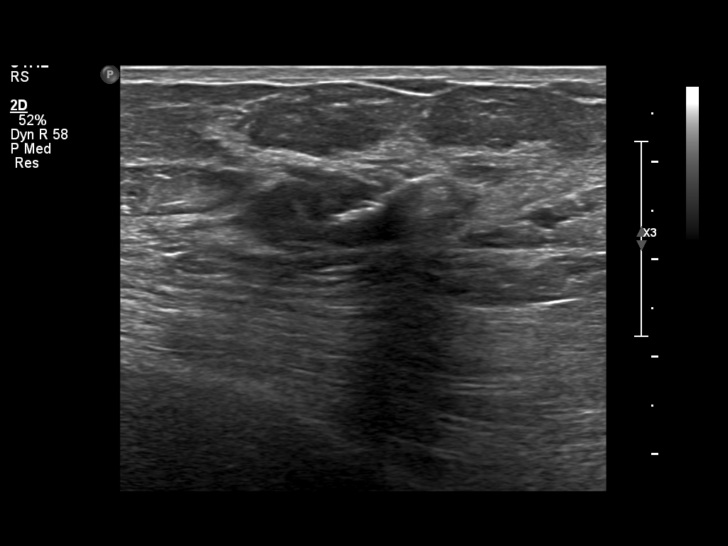

[13 of 13 positions shown; findings below may reference images not displayed]

DIAGNOSTIC STUDIES

EXAM
Ultrasound-guided left breast biopsy.

INDICATION
left breast mass
lt breast mass; abnormal mamm and ultrasound

TECHNIQUE
Standard ultrasound-guided left breast biopsy is performed using ultrasound guidance.

COMPARISONS
July 01, 2018

FINDINGS
Procedure risks were explained in detail to the patient. After informed consent, patient was
prepped and draped in sterile fashion. Left breast mass was identified using ultrasound guidance.
Five automated core samples were obtained without difficulty. Surgical clip was deployed.

IMPRESSION
Left breast biopsy as described. Final pathology is pending.

Tech Notes:

lt breast mass; abnormal mamm and  ultrasound

## 2020-06-12 ENCOUNTER — Encounter: Admit: 2020-06-12 | Discharge: 2020-06-12 | Payer: MEDICARE

## 2020-06-12 DIAGNOSIS — Z006 Encounter for examination for normal comparison and control in clinical research program: Secondary | ICD-10-CM

## 2020-07-04 ENCOUNTER — Encounter: Admit: 2020-07-04 | Discharge: 2020-07-04 | Payer: MEDICARE

## 2020-07-04 ENCOUNTER — Ambulatory Visit: Admit: 2020-07-04 | Discharge: 2020-07-04 | Payer: MEDICARE

## 2020-07-04 DIAGNOSIS — Z08 Encounter for follow-up examination after completed treatment for malignant neoplasm: Secondary | ICD-10-CM

## 2020-07-04 DIAGNOSIS — M1990 Unspecified osteoarthritis, unspecified site: Secondary | ICD-10-CM

## 2020-07-04 DIAGNOSIS — C50412 Malignant neoplasm of upper-outer quadrant of left female breast: Secondary | ICD-10-CM

## 2020-07-04 DIAGNOSIS — E785 Hyperlipidemia, unspecified: Secondary | ICD-10-CM

## 2020-07-04 DIAGNOSIS — Z1231 Encounter for screening mammogram for malignant neoplasm of breast: Secondary | ICD-10-CM

## 2020-07-04 DIAGNOSIS — Z9189 Other specified personal risk factors, not elsewhere classified: Secondary | ICD-10-CM

## 2020-07-04 DIAGNOSIS — C50919 Malignant neoplasm of unspecified site of unspecified female breast: Secondary | ICD-10-CM

## 2020-07-04 DIAGNOSIS — J302 Other seasonal allergic rhinitis: Secondary | ICD-10-CM

## 2020-07-04 DIAGNOSIS — I890 Lymphedema, not elsewhere classified: Secondary | ICD-10-CM

## 2020-07-04 DIAGNOSIS — R922 Inconclusive mammogram: Secondary | ICD-10-CM

## 2020-07-04 NOTE — Progress Notes
Lymphedema Prevention Bioimpedance Spectroscopy (BIS) Monitoring    Notified patient via MyChart result was normal and to continue with routine follow up as scheduled.  Provided clinic contact information for any questions or concerns.     Reviewed BIS testing results from today.  Results:     Current: 3.2  Baseline: 6.0  Change from Baseline: -2.8    WNL less than 3 standard deviation increase from baseline.

## 2020-07-04 NOTE — Progress Notes
Name: Alicia Sharp          MRN: [id number]      DOB: 23-Feb-1956      AGE: 64 y.o.   DATE OF SERVICE: 07/04/2020    Subjective:             Reason for Visit:  Heme/Onc Care      Alicia Sharp is a 64 y.o. female.     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  She is 2 years out from diagnosis.  She is doing well today and has no focal breast or systemic concerns.     History: Alicia Sharp is a female who presented to the [facility] on 07/16/2018 at age 27 for evaluation of her left breast cancer. Her screening mammograms in November 2019 showed a spiculated mss in the left breast and diagnosed mammogram redemonstrated the mass with suspicious calcifications. Ultrasound showed a 1.8 cm irregular mass corresponding with the mammogram finding and biopsy was recommended. Left breast biopsy 07/02/18 revealed a grade 2, IDC at 12:00. Left lumpectomy/SLNB 08/17/2018. Surgical pathology showed a 1.8 cm grade 2, IDC. There were 2 negative SLNs. She completed radiation therapy with Dr. Haskel Khan 10/12/2018 and she was started on Arimidex.     Breast Imaging:  Mammogram:   -- Screening mammograms 06/30/18 [facility]) showed scattered fibroglandular tissue. There was large spiculated mass with surrounding architectural distortion at 12:00, 6-7 cm posterior to the nipple. No suspicious calcifications were seen.   -- Left diagnostic mammogram 07/01/18 [facility]) redemonstrated a left breast mass without accompanying suspicious calcification.   -- Bilateral diagnostic mammogram 07/17/18 ([place] [facility]) revealed no suspicious masses, calcifications or architectural distortion present to suggest malignancy within the right breast. The left breast was significant for an irregular spiculated high density mass within the upper left breast at 12:00, middle depth measuring at least 1.7 cm. A biopsy clip is seen along the anterior superior margin of the mass. There are no associated calcifications. This is consistent with biopsy-proven invasive ductal carcinoma. There is a posteriorly displaced ribbon-shaped biopsy clip within the upper left breast at 12:00, far posterior depth. This biopsy clip is 3.5 cm posterior to the malignant mass. There is no additional suspicious mammographic finding seen within the left breast.  Ultrasound:   -- Left breast ultrasound 07/01/18 [facility]) showed a 1.8 cm irregular hypoechoic ovoid mass at 12:00, 3 cm FTN corresponding with the abnormality seen on mammogram. No suspicious lymphadenopathy was seen in the axilla. BIRADS 5.   -- Left breast ultrasound 07/17/18 ([place]) revealed biopsy-proven malignant left breast mass at 12:00, 3 cm from the nipple measuring up to 2.0 cm. No morphologically suspicious left axillary lymph nodes. No additional suspicious mammographic or sonographic finding within either breast.    Reproductive health:  Age at Menarche:  88  Age at First Live Birth:  60  Age at Menopause:  2  Gravida: 3   Para: 2  Breastfeeding: 2 years      Procedure: Left lumpectomy/SLNB, 08/17/2018 [facility])  Pertinent PMH: Negative  Family History: Maternal aunt had breast cancer, deceased at age 65    Medical Oncology: Dr. Biagio Borg  Present Therapy: Arimidex  Referred by: Gardiner Barefoot, ARNP        Review of Systems    Constitutional: Negative for fever, chills, appetite change and fatigue.   HENT: Negative for hearing loss, congestion, rhinorrhea and tinnitus.    Eyes: Negative for pain, discharge and itching.   Respiratory: Negative for cough, chest tightness and shortness of breath.    Cardiovascular: Negative for chest pain and palpitations.   Gastrointestinal: Negative for abdominal distention, pain, nausea, vomiting, and diarrhea.   Genitourinary: Negative for frequency, vaginal bleeding, difficulty urinating and pelvic pain.   Musculoskeletal: Joint pain. Negative for myalgias, back pain, joint swelling.   Skin: Negative for rash.   Neurological: Headaches. Negative for dizziness, weakness, light-headedness.   Hematological: Does not bruise/bleed easily.   Psychiatric/Behavioral: Negative for disturbed wake/sleep cycle. The patient is not nervous/anxious.    The following medical/surgical/family/social history and the list of medications are current, as of 07/04/2020    Medical History:   Diagnosis Date   ? Arthritis    ? Breast cancer (HCC) 07/2018    left breast   ? Hyperlipidemia    ? Seasonal allergies      Surgical History:   Procedure Laterality Date   ? LEFT RADIOACTIVE SEED LOCALIZED LUMPECTOMY Left 08/17/2018    Performed by Ignatius Specking, MD at [facility]   ? IDENTIFICATION SENTINEL LYMPH NODE Left 08/17/2018    Performed by Ignatius Specking, MD at [facility]   ? INJECTION RADIOACTIVE TRACER FOR SENTINEL NODE IDENTIFICATION Left 08/17/2018    Performed by Ignatius Specking, MD at [facility]   ? LEFT SENTINEL LYMPH NODE BIOPSY Left 08/17/2018    Performed by Ignatius Specking, MD at [facility]   ? COLONOSCOPY DIAGNOSTIC WITH SPECIMEN COLLECTION BY BRUSHING/ WASHING - FLEXIBLE N/A 04/07/2019    Performed by Onnie Boer, MD at [facility]   ? HX ROTATOR CUFF REPAIR      R 08/2010. L 09/2015.   ? HX WISDOM TEETH EXTRACTION     ? KNEE ARTHROSCOPY Left     2014     Family History   Problem Relation Age of Onset   ? Migraines Mother    ? Heart Disease Father    ? Stroke Father    ? Coronary Artery Disease Father    ? Cancer-Breast Maternal Aunt         30s   ? Cancer-Prostate Maternal Grandfather    ? Migraines Maternal Grandfather      Social History     Socioeconomic History   ? Marital status: Widowed     Spouse name: Not on file   ? Number of children: 2   ? Years of education: Not on file   ? Highest education level: Not on file   Occupational History   ? Occupation: retired   Tobacco Use   ? Smoking status: Never Smoker   ? Smokeless tobacco: Never Used   ? Tobacco comment: briefly in college   Substance and Sexual Activity   ? Alcohol use: Yes     Alcohol/week: 5.0 standard drinks     Types: 5 Glasses of wine per  week     Comment: 1 glass of wine/evening   ? Drug use: Never   ? Sexual activity: Not on file   Other Topics Concern   ? Not on file   Social History Narrative    Here with daughter Denny Peon       No Known Allergies    Objective:         ? anastrozole (ARIMIDEX) 1 mg tablet Take 1 mg by mouth daily.   ? atorvastatin (LIPITOR) 40 mg tablet Take one-half tablet by mouth daily.   ? cholecalciferol (VITAMIN D-3) 1,000 units tablet    ? ferrous sulfate (FEOSOL) 325 mg (65 mg iron) tablet Take one tablet by mouth daily. Take on an empty stomach at least 1 hour before or 2 hours after food.   ? meloxicam (MOBIC) 15 mg tablet TAKE ONE TABLET BY MOUTH DAILY.     Vitals:    07/04/20 0926   BP: 135/53   BP Source: Arm, Right Upper   Patient Position: Sitting   Pulse: 65   Temp: 36.8 ?C (98.2 ?F)   TempSrc: Temporal   SpO2: 99%   Weight: 76.2 kg (168 lb)   Height: 161.3 cm (63.5)   PainSc: Zero     Body mass index is 29.29 kg/m?Marland Kitchen     Pain Score: Zero       Fatigue Scale: 0-None    Pain Addressed:  N/A    Patient Evaluated for a Clinical Trial: Patient currently enrolled in a [place] treatment clinical trial.     Eastern Cooperative Oncology Group performance status is 0, Fully active, able to carry on all pre-disease performance without restriction.Marland Kitchen     Physical Exam  Vitals reviewed.   Chest:                RIGHT BREAST EXAM:  Breast:  No palpable breast masses. No skin, nipple, or areolar change.   Skin Erythema:  No  Attachment of Overlying Skin:  No  Peau d' orange:  No  Chest Wall Attachment:  No  Nipple Inversion:  No  Nipple Discharge: No    LEFT BREAST EXAM:  Breast: Consistent with previous lumpectomy/SLNB.  Mild radiation skin changes and mild central lymphedema.  No palpable masses.   Skin Erythema:  No  Attachment of Overlying Skin:  No  Peau d' orange:  No  Chest Wall Attachment: No  Nipple Inversion:  No  Nipple Discharge:  No    RIGHT NODAL BASIN EXAM:  Axillary:  negative  Infraclavicular:  negative  Supraclavicular:  negative    LEFT NODAL BASIN EXAM:  Axillary:  negative  Infraclavicular: negative  Supraclavicular:  negative    Constitutional: No acute distress.  HEENT:  Head: Normocephalic and atraumatic.  Eyes: No discharge. No scleral icterus.  Pulmonary/Chest: No respiratory distress.   Lymphadenopathy: There is no axillary, infraclavicular, or supraclavicular adenopathy.  Neurological: Alert and oriented to person, place and time. No cranial nerve deficit.  Skin: Warm and dry. No rash noted. No erythema. No pallor.  Psychiatric: Normal mood and affect. Behavior is normal. Judgement and thought content normal.    Imaging Review:    [id number] DIGITAL MAMMO SCREEN BILAT/TOMO/CAD: July 04, 2020 -   ACCESSION #: [id number]   3D Procedure   3D Routine views.   2D Synthetic Routine views.     Prior study comparison: July 02, 2019, bilateral [id number]   MAMMO DIAGNOSTIC BIL/TOMO performed at [facility]. ?July 17, 2018, bilateral [id number] MAMMO   DIAGNOSTIC BIL/TOMO performed at [facility]. ?January 12, 2016, mammogram.     The breasts are heterogeneously dense, which may obscure small   masses. The breast tissue is heterogeneously dense, which is a   risk factor for breast cancer and lowers the sensitivity of   detection of breast cancer. This patient may benefit from supplementary screening with ultrasound. This procedure will   require a physician order. Scheduling phone # is [phone].   Similar information has been provided in the lay letter to this   patient. (Please disregard if screening ultrasound or MRI has   already been done.) ?3D and reconstructed 2D images were   obtained.     Benign post-therapeutic changes are seen. No new suspicious   abormality is seen.       Electronically signed and approved by: Gwynneth Aliment, M.D.   [phone]   IMPRESSION     ASSESSMENT: BIRAD 2-Benign         RECOMMENDATION:   Routine screening mammogram in 1 year.          Assessment and Plan:    64 y/o female with left grade 2 IDC (ER 91-100%, PR91-100%, Her2 0+, surgery markers- ER100%, PR95%, Her2 1+, Ki-67 4%),  at 12:00, dx 06/2018     Alicia Sharp continues to do well.  There is no clinical evidence of recurrence.  There have been no changes to her past medical or family history since her last visit.   She had a bilateral screening mammogram prior to her clinic visit today, and results were reviewed and were benign. Dense breast tissue was noted, and we discussed supplemental ultrasound screening.  She was in favor of this, and schedulers will arrange.  I will contact her with the results when they are available.     She is tolerating Arimidex, and will continue to follow with Dr. Biagio Borg.  Alicia Sharp had BIS measurements today, and will be contacted by a lymphedema nurse with her results.  Provided this is WNL, she no longer needs to have routine BIS surveillance as the risk of lymphedema is very low at this point.  She will contact the lymphedema clinic if she ever develops any concerning lymphedema symptoms. She was encouraged to continue to protect her left arm indefinitely. She denies any arm swelling or ROM issues.     Provided her screening ultrasound is benign, Alicia Sharp no longer needs to follow in our clinic on a scheduled basis.  Dr. Biagio Borg can order her screening imaging as long as she continues to follow with her.  She may then transition to Survivorship if desired. She will RTC prn. Alicia Sharp was given ample time to ask questions all of which were answered to her satisfaction.       1.  Bilateral screening ultrasound - contact with results  2.  Continue to follow with Dr. Biagio Borg  3.  Bilateral screening mammogram and ultrasound in 1 year - ordered by Dr. Biagio Borg  4.  RTC prn      Scarlett Presto, PA-C

## 2020-07-04 NOTE — Progress Notes
Bioimpedance Spectroscopy performed.  Advised patient that Normal result will be sent within 24 hours by mail or via Mychart (preferred).  The patient will be contacted via phone by the lymphedema nurse with any abnormal results.

## 2020-07-12 ENCOUNTER — Encounter: Admit: 2020-07-12 | Discharge: 2020-07-12 | Payer: MEDICARE

## 2020-07-13 ENCOUNTER — Encounter: Admit: 2020-07-13 | Discharge: 2020-07-13 | Payer: MEDICARE

## 2020-07-13 ENCOUNTER — Ambulatory Visit: Admit: 2020-07-13 | Discharge: 2020-07-13 | Payer: MEDICARE

## 2020-07-13 DIAGNOSIS — R922 Inconclusive mammogram: Secondary | ICD-10-CM

## 2020-07-30 ENCOUNTER — Encounter: Admit: 2020-07-30 | Discharge: 2020-07-30 | Payer: MEDICARE

## 2020-07-31 ENCOUNTER — Encounter: Admit: 2020-07-31 | Discharge: 2020-07-31 | Payer: MEDICARE

## 2020-07-31 ENCOUNTER — Ambulatory Visit: Admit: 2020-07-31 | Discharge: 2020-07-31 | Payer: MEDICARE

## 2020-07-31 DIAGNOSIS — R509 Fever, unspecified: Secondary | ICD-10-CM

## 2020-07-31 DIAGNOSIS — R6883 Chills (without fever): Secondary | ICD-10-CM

## 2020-07-31 DIAGNOSIS — M1990 Unspecified osteoarthritis, unspecified site: Secondary | ICD-10-CM

## 2020-07-31 DIAGNOSIS — R110 Nausea: Secondary | ICD-10-CM

## 2020-07-31 DIAGNOSIS — G4452 New daily persistent headache (NDPH): Secondary | ICD-10-CM

## 2020-07-31 DIAGNOSIS — Z20822 Encounter for screening laboratory testing for COVID-19 virus: Secondary | ICD-10-CM

## 2020-07-31 DIAGNOSIS — J302 Other seasonal allergic rhinitis: Secondary | ICD-10-CM

## 2020-07-31 DIAGNOSIS — R5383 Other fatigue: Secondary | ICD-10-CM

## 2020-07-31 DIAGNOSIS — C50919 Malignant neoplasm of unspecified site of unspecified female breast: Secondary | ICD-10-CM

## 2020-07-31 DIAGNOSIS — E785 Hyperlipidemia, unspecified: Secondary | ICD-10-CM

## 2020-07-31 MED ORDER — ONDANSETRON HCL 8 MG PO TAB
8 mg | ORAL_TABLET | ORAL | 0 refills | 8.00000 days | Status: AC | PRN
Start: 2020-07-31 — End: ?

## 2020-07-31 NOTE — Patient Instructions
Febrile Illness with Uncertain Cause (Adult)   You have a fever, but the cause is unknown. A fever is the body's natural reaction to an illness such as infection due to a virus or bacteria. Sometimes other conditions such as cancer or immune diseases can cause fever. This might be more likely if your fever has lasted for more than a week or 2. In most cases, the higher temperature itself isn't harmful. It actually helps the body fight infections. A fever doesn't need to be treated unless you feel very uncomfortable.   Sometimes a fever can be an early sign of a more serious infection. So follow up if your condition gets worse.   Home care  Unless given other instructions by your healthcare provider, follow these guidelines when caring for yourself at home.   General care  ? If your symptoms are not severe, rest at home for the first 2 to 3 days. When you are active again, don't let yourself get too tired.  ? For your overall health, don't smoke. Also stay away from secondhand smoke.  ? You may not feel like eating too much. So a light diet is fine. Stay hydrated by drinking 6 to 8 glasses of fluids per day. This includes water, soft drinks, sports drinks, juices, tea, or soup. If you have congestion, extra fluids will help loosen secretions in the nose and lungs.  Medicines  ? You can take acetaminophen or ibuprofen for pain or to lower your temperature, unless you were given a different medicine to use. If you have chronic liver or kidney disease or have ever had a stomach ulcer or gastrointestinal bleeding, talk with your healthcare provider before using these medicines. Also talk with your provider if you are taking medicine to prevent blood clots. Don't give aspirin to anyone younger than age 21 unless directed by the provider. It may cause a serious illness called Reye syndrome. This most often affects the brain and liver.  ? If you were given antibiotics for an infection, take them until they are used up, or your healthcare provider tells you to stop. It's important to finish the antibiotics even if you feel better. This is to make sure the infection has cleared. Antibiotics are not often given for a viral infection or a fever with an unknown cause.  ? Over-the-counter medicines will not shorten the length of the illness. But they may be help with symptoms. These include cough, sore throat, or nasal and sinus congestion. Ask your pharmacist for product suggestions. Don't use decongestants if you have high blood pressure.    Follow-up care  Follow up with your healthcare provider, or as advised.  ? If a culture or other lab tests were done, you will be told if your treatment needs to be changed. Call your healthcare provider as directed for the results.  ? If X-rays, a CT scan, MRI, or an ultrasound were done, a specialist will review them. You will be told of any findings that may affect your care. Call your healthcare provider as directed for the results.  Call 911  Call 911if any of these occur:   ? Trouble breathing or swallowing, or wheezing  ? Chest pain  ? Confusion  ? Extreme drowsiness or trouble waking up  ? Fainting or loss of consciousness  ? Fast heart rate  ? Low blood pressure  ? Vomiting blood, or large amounts of blood in stool  ? Seizure  When to get medical advice  Call  your healthcare provider right away if any of these occur:   ? Cough with lots of colored sputum (mucus) or blood in your sputum  ? Severe headache  ? Face, neck, throat, or ear pain  ? Feeling drowsy  ? Belly pain  ? Repeated vomiting or diarrhea; bloody diarrhea  ? Joint pain or a new rash  ? Burning when urinating  ? Fever of 100.4?F (38?C) or higher, or as directed by your provider  ? Shaking chills  ? Feeling weak or dizzy

## 2020-07-31 NOTE — Progress Notes
Date of Service: 07/31/2020    Alicia Sharp is a 64 y.o. female.  DOB: 02-11-1956  MRN: [id number]     Subjective:              Chief Complaint   Patient presents with   ? Chills   ? Fever   ? Headache       History of Present Illness  12/18 had significant chills and a strong headache, her temperature got as high as 102.44F on 12/18. This morning she felt lightheaded and was afraid she might pass out, attributes this to dehydration because her lips feel dry, and her appetite has been down, the stomach is not settled. The headache is better today. Has taken acetaminophen, and it did help her rest. Has been fully vaccinated against COVID and influenza. Is travelling to visit her mother, and does not want to pass anything on to anyone, so would like COVID and Influenza testing today. No additional concerns.       Review of Systems   Constitutional: Positive for activity change, appetite change, chills, fatigue and fever. Negative for diaphoresis.   HENT: Negative for congestion, ear discharge, ear pain, postnasal drip, rhinorrhea, sinus pressure, sinus pain, trouble swallowing and voice change.    Respiratory: Negative for cough, chest tightness, shortness of breath and wheezing.    Gastrointestinal: Positive for nausea. Negative for diarrhea and vomiting.   Genitourinary: Negative for dysuria.   Musculoskeletal: Negative for myalgias, neck pain and neck stiffness.   Skin: Negative for rash.   Neurological: Positive for light-headedness and headaches. Negative for dizziness.         Objective:         ? anastrozole (ARIMIDEX) 1 mg tablet Take 1 mg by mouth daily.   ? atorvastatin (LIPITOR) 40 mg tablet Take one-half tablet by mouth daily.   ? cholecalciferol (VITAMIN D-3) 1,000 units tablet    ? ferrous sulfate (FEOSOL) 325 mg (65 mg iron) tablet Take one tablet by mouth daily. Take on an empty stomach at least 1 hour before or 2 hours after food.   ? meloxicam (MOBIC) 15 mg tablet TAKE ONE TABLET BY MOUTH DAILY. Vitals:    07/31/20 1219   BP: 104/78   BP Source: Arm, Left Upper   Patient Position: Sitting   Pulse: 84   Resp: 18   Temp: 36.9 ?C (98.4 ?F)   TempSrc: Temporal   SpO2: 96%   Weight: 74.8 kg (165 lb)   Height: 157.5 cm (62)     Body mass index is 30.18 kg/m?Marland Kitchen     Physical Exam  Vitals reviewed.   Constitutional:       General: She is not in acute distress.     Appearance: Normal appearance. She is normal weight. She is not ill-appearing.   HENT:      Right Ear: Tympanic membrane, ear canal and external ear normal.      Left Ear: Tympanic membrane, ear canal and external ear normal.      Nose: Nose normal.      Mouth/Throat:      Mouth: Mucous membranes are moist.      Pharynx: Oropharynx is clear.   Eyes:      Extraocular Movements: Extraocular movements intact.      Conjunctiva/sclera: Conjunctivae normal.      Pupils: Pupils are equal, round, and reactive to light.   Cardiovascular:      Rate and Rhythm: Normal rate and regular rhythm.  Heart sounds: Normal heart sounds. No murmur heard.     Pulmonary:      Effort: Pulmonary effort is normal.      Breath sounds: Normal breath sounds.   Abdominal:      General: Bowel sounds are normal. There is no distension.      Palpations: Abdomen is soft. There is no hepatomegaly, splenomegaly or mass.      Tenderness: There is no abdominal tenderness. There is no right CVA tenderness, left CVA tenderness, guarding or rebound.      Hernia: No hernia is present.   Musculoskeletal:      Cervical back: Normal range of motion and neck supple.   Lymphadenopathy:      Cervical: No cervical adenopathy.   Skin:     General: Skin is warm and dry.      Capillary Refill: Capillary refill takes less than 2 seconds.   Neurological:      General: No focal deficit present.      Mental Status: She is alert and oriented to person, place, and time.   Psychiatric:         Mood and Affect: Mood normal.         Behavior: Behavior normal.         Thought Content: Thought content normal. Judgment: Judgment normal.       Results for orders placed or performed in visit on 07/31/20 (from the past 336 hour(s))   POC RAPID INFLUENZA ASSAY W/OPTIC   Result Value Ref Range    Influenza A Negative Negative, Invalid    Influenza B Negative Negative, Invalid    POC Flu Quality Control Pass           Assessment and Plan:  Alicia Sharp was seen today for chills, fever and headache.    Diagnoses and all orders for this visit:    Nausea  -     ondansetron HCL (ZOFRAN) 8 mg tablet; Take one tablet by mouth every 8 hours as needed for Nausea or Vomiting for up to 7 days.    Chills  -     COVID-19 (SARS-COV-2) PCR; Future; Expected date: 07/31/2020    Fatigue, unspecified type  -     COVID-19 (SARS-COV-2) PCR; Future; Expected date: 07/31/2020    Fever of unknown origin  -     POC RAPID INFLUENZA ASSAY W/OPTIC  -     COVID-19 (SARS-COV-2) PCR; Future; Expected date: 07/31/2020    New daily persistent headache (ndph)  -     COVID-19 (SARS-COV-2) PCR; Future; Expected date: 07/31/2020    Encounter for screening laboratory testing for COVID-19 virus  -     COVID-19 (SARS-COV-2) PCR; Future; Expected date: 07/31/2020          -     F/u with PCP for on-going treatment        -     ED precautions given            Patient Instructions     Febrile Illness with Uncertain Cause (Adult)   You have a fever, but the cause is unknown. A fever is the body's natural reaction to an illness such as infection due to a virus or bacteria. Sometimes other conditions such as cancer or immune diseases can cause fever. This might be more likely if your fever has lasted for more than a week or 2. In most cases, the higher temperature itself isn't harmful. It actually helps the body fight  infections. A fever doesn't need to be treated unless you feel very uncomfortable.   Sometimes a fever can be an early sign of a more serious infection. So follow up if your condition gets worse.   Home care  Unless given other instructions by your healthcare provider, follow these guidelines when caring for yourself at home.   General care  ? If your symptoms are not severe, rest at home for the first 2 to 3 days. When you are active again, don't let yourself get too tired.  ? For your overall health, don't smoke. Also stay away from secondhand smoke.  ? You may not feel like eating too much. So a light diet is fine. Stay hydrated by drinking 6 to 8 glasses of fluids per day. This includes water, soft drinks, sports drinks, juices, tea, or soup. If you have congestion, extra fluids will help loosen secretions in the nose and lungs.  Medicines  ? You can take acetaminophen or ibuprofen for pain or to lower your temperature, unless you were given a different medicine to use. If you have chronic liver or kidney disease or have ever had a stomach ulcer or gastrointestinal bleeding, talk with your healthcare provider before using these medicines. Also talk with your provider if you are taking medicine to prevent blood clots. Don't give aspirin to anyone younger than age 29 unless directed by the provider. It may cause a serious illness called Reye syndrome. This most often affects the brain and liver.  ? If you were given antibiotics for an infection, take them until they are used up, or your healthcare provider tells you to stop. It's important to finish the antibiotics even if you feel better. This is to make sure the infection has cleared. Antibiotics are not often given for a viral infection or a fever with an unknown cause.  ? Over-the-counter medicines will not shorten the length of the illness. But they may be help with symptoms. These include cough, sore throat, or nasal and sinus congestion. Ask your pharmacist for product suggestions. Don't use decongestants if you have high blood pressure.    Follow-up care  Follow up with your healthcare provider, or as advised.  ? If a culture or other lab tests were done, you will be told if your treatment needs to be changed. Call your healthcare provider as directed for the results.  ? If X-rays, a CT scan, MRI, or an ultrasound were done, a specialist will review them. You will be told of any findings that may affect your care. Call your healthcare provider as directed for the results.  Call 911  Call 911if any of these occur:   ? Trouble breathing or swallowing, or wheezing  ? Chest pain  ? Confusion  ? Extreme drowsiness or trouble waking up  ? Fainting or loss of consciousness  ? Fast heart rate  ? Low blood pressure  ? Vomiting blood, or large amounts of blood in stool  ? Seizure  When to get medical advice  Call your healthcare provider right away if any of these occur:   ? Cough with lots of colored sputum (mucus) or blood in your sputum  ? Severe headache  ? Face, neck, throat, or ear pain  ? Feeling drowsy  ? Belly pain  ? Repeated vomiting or diarrhea; bloody diarrhea  ? Joint pain or a new rash  ? Burning when urinating  ? Fever of 100.4?F (38?C) or higher, or as directed by your provider  ?  Shaking chills  ? Feeling weak or dizzy

## 2020-08-10 ENCOUNTER — Encounter: Admit: 2020-08-10 | Discharge: 2020-08-10 | Payer: MEDICARE

## 2020-08-31 ENCOUNTER — Encounter: Admit: 2020-08-31 | Discharge: 2020-08-31 | Payer: MEDICARE

## 2020-09-01 ENCOUNTER — Encounter: Admit: 2020-09-01 | Discharge: 2020-09-01 | Payer: MEDICARE

## 2020-09-01 MED ORDER — CIPROFLOXACIN HCL 750 MG PO TAB
750 mg | ORAL_TABLET | Freq: Every day | ORAL | 0 refills | 10.00000 days | Status: AC
Start: 2020-09-01 — End: ?

## 2020-09-01 NOTE — Progress Notes
Provided prescription for ciprofloxacin if needed for diarrhea while on trip.

## 2020-09-06 ENCOUNTER — Encounter: Admit: 2020-09-06 | Discharge: 2020-09-06 | Payer: MEDICARE

## 2020-09-06 DIAGNOSIS — Z780 Asymptomatic menopausal state: Secondary | ICD-10-CM

## 2020-09-06 DIAGNOSIS — Z79811 Long term (current) use of aromatase inhibitors: Secondary | ICD-10-CM

## 2020-09-06 DIAGNOSIS — C50412 Malignant neoplasm of upper-outer quadrant of left female breast: Secondary | ICD-10-CM

## 2020-09-11 ENCOUNTER — Encounter: Admit: 2020-09-11 | Discharge: 2020-09-11 | Payer: MEDICARE

## 2020-09-11 MED ORDER — MELOXICAM 15 MG PO TAB
ORAL_TABLET | Freq: Every day | 2 refills
Start: 2020-09-11 — End: ?

## 2020-09-12 ENCOUNTER — Encounter: Admit: 2020-09-12 | Discharge: 2020-09-12 | Payer: MEDICARE

## 2020-09-12 NOTE — Telephone Encounter
Message sent to schedule at this time to reschedule patient for in person visit for the month of February with [facility], NP.

## 2020-09-19 ENCOUNTER — Encounter: Admit: 2020-09-19 | Discharge: 2020-09-19 | Payer: MEDICARE

## 2020-10-03 NOTE — Patient Instructions
Thank you for coming to see us today.   Please call our office or send a message through MyChart if you have any questions or concerns.          Dr. Anne O'Dea and Michelle Prager, APRN  [phone] (Nurses: Anabel Lykins/Bailee Deviney/Gina Crimi)  [phone] (fax)    [facility] [facility]  [address]  [place], [place] [address]    [facility]  [address]  [place], [place] [address]

## 2020-10-04 ENCOUNTER — Encounter: Admit: 2020-10-04 | Discharge: 2020-10-04 | Payer: MEDICARE

## 2020-10-04 DIAGNOSIS — M1990 Unspecified osteoarthritis, unspecified site: Secondary | ICD-10-CM

## 2020-10-04 DIAGNOSIS — C50919 Malignant neoplasm of unspecified site of unspecified female breast: Secondary | ICD-10-CM

## 2020-10-04 DIAGNOSIS — E785 Hyperlipidemia, unspecified: Secondary | ICD-10-CM

## 2020-10-04 DIAGNOSIS — J302 Other seasonal allergic rhinitis: Secondary | ICD-10-CM

## 2020-10-16 ENCOUNTER — Encounter: Admit: 2020-10-16 | Discharge: 2020-10-16 | Payer: MEDICARE

## 2020-10-23 ENCOUNTER — Ambulatory Visit: Admit: 2020-10-23 | Discharge: 2020-10-24 | Payer: MEDICARE

## 2020-10-23 ENCOUNTER — Encounter: Admit: 2020-10-23 | Discharge: 2020-10-23 | Payer: MEDICARE

## 2020-10-23 DIAGNOSIS — Z79899 Other long term (current) drug therapy: Secondary | ICD-10-CM

## 2020-10-23 DIAGNOSIS — C50919 Malignant neoplasm of unspecified site of unspecified female breast: Secondary | ICD-10-CM

## 2020-10-23 DIAGNOSIS — R202 Paresthesia of skin: Secondary | ICD-10-CM

## 2020-10-23 DIAGNOSIS — M1990 Unspecified osteoarthritis, unspecified site: Secondary | ICD-10-CM

## 2020-10-23 DIAGNOSIS — E669 Obesity, unspecified: Secondary | ICD-10-CM

## 2020-10-23 DIAGNOSIS — E785 Hyperlipidemia, unspecified: Secondary | ICD-10-CM

## 2020-10-23 DIAGNOSIS — C50412 Malignant neoplasm of upper-outer quadrant of left female breast: Secondary | ICD-10-CM

## 2020-10-23 DIAGNOSIS — J302 Other seasonal allergic rhinitis: Secondary | ICD-10-CM

## 2020-10-23 DIAGNOSIS — E7800 Pure hypercholesterolemia, unspecified: Secondary | ICD-10-CM

## 2020-10-23 DIAGNOSIS — J701 Chronic and other pulmonary manifestations due to radiation: Secondary | ICD-10-CM

## 2020-10-23 MED ORDER — PHENTERMINE 15 MG PO CAP
15 mg | ORAL_CAPSULE | Freq: Every day | ORAL | 0 refills | Status: DC
Start: 2020-10-23 — End: 2020-10-23

## 2020-10-23 MED ORDER — METFORMIN 500 MG PO TR24
500 mg | ORAL_TABLET | Freq: Every day | ORAL | 3 refills | Status: CN
Start: 2020-10-23 — End: ?

## 2020-10-23 MED ORDER — PHENTERMINE 15 MG PO CAP
15 mg | ORAL_CAPSULE | Freq: Every day | ORAL | 0 refills | Status: AC
Start: 2020-10-23 — End: ?

## 2020-10-23 NOTE — Progress Notes
I performed a history and brief physical examination of [name]  and discussed the management with Dr. Valerie Salts, MD at the time of the visit.  I agree with the evaluation and plan as documented by this resident, unless noted below, or in the resident's note in bold, blue italics.     - repeat CT chest ordered to f/up on pulmonary nodules.  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  - starting phentermine 15mg  daily.  Will come back in 1 month for BP/HR check.  Can increase to 30mg  if needed at that time.  If achieves >5% TBW loss at 3 months and has no S/Es, can continue.  - pt voiced understanding of plan.    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  Future Appointments   Date Time Provider [place]   04/10/2021 11:30 AM Prager, Jennye Moccasin [place] Exam     Rosaria Ferries, MD

## 2020-10-23 NOTE — Progress Notes
Date of Service: 10/23/2020    Alicia Sharp is a 65 y.o. female. DOB: Aug 05, 1956   MRN#: [id number]    Subjective:       History of Present Illness   Alicia Sharp is a 65 y.o. female presents today for follow up.  She has history of breast cancer and follows with Dr. Biagio Borg.  She is currently taking anastrozole.  She had a BMD in January 2022 which showed osteopenia.  She is a little disheartened by her decline in bone mineral density.  She has discussed this with Dr. Biagio Borg and they are planning on getting a repeat BMD in 1 year.  In the meantime, she is taking at 1000 mg of vitamin D supplementation daily.  She is not currently on calcium.  She started a weight exercising classes online, weights at eight which she enjoys.  She has been trying to eat more foods with calcium.    She is still having a little numbness in her foot.  She was previously diagnosed with Morton's neuroma and does not currently wish to pursue any surgery.  She is interested in trying acupuncture.  She states that her symptoms do not prevent her from doing her daily activities.  She states that it does not affect her quality of life.    She has been trying to lose weight and has been doing Noom for the past year.  She states that she has issues with eating when she is bored.  She is now interested in starting pharmacological therapy.  She denies history of MEN syndrome or medullary thyroid cancer.  She denies any issues with blood pressure at this time.  She is not currently on any stimulants.  She denies history of ADHD.    She recently had a trip to [place].  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  She had a great time.  She did not have any issues with diarrhea and did not need to take antibiotics during that time.       Review of Systems   Constitutional: Negative for fever.   HENT: Negative for sore throat. Eyes: Negative for blurred vision.   Cardiovascular: Negative for chest pain.   Respiratory: Negative for cough.    Endocrine: Negative for cold intolerance and heat intolerance.   Hematologic/Lymphatic: Negative for bleeding problem.   Skin: Negative for rash.   Musculoskeletal: Negative for back pain.   Gastrointestinal: Negative for abdominal pain and change in bowel habit.   Neurological: Negative for headaches.   Psychiatric/Behavioral: The patient does not have insomnia.    Allergic/Immunologic: Negative for environmental allergies.   All other systems reviewed and are negative.      ?    Objective:     ? anastrozole (ARIMIDEX) 1 mg tablet Take 1 mg by mouth daily.   ? atorvastatin (LIPITOR) 40 mg tablet Take one-half tablet by mouth daily.   ? cholecalciferol (VITAMIN D-3) 1,000 units tablet    ? ferrous sulfate (FEOSOL) 325 mg (65 mg iron) tablet Take one tablet by mouth daily. Take on an empty stomach at least 1 hour before or 2 hours after food.   ? meloxicam (MOBIC) 15 mg tablet TAKE 1 TABLET BY MOUTH DAILY     Vitals:    10/23/20 1257   BP: 118/49   BP Source: Arm, Left Upper   Patient Position:  Sitting   Pulse: 69   Resp: 18   Temp: 36.7 ?C (98 ?F)   SpO2: 100%   Weight: 75.8 kg (167 lb)   Height: 157.5 cm (5' 2)   PainSc: Zero     Body mass index is 30.54 kg/m?Marland Kitchen     Physical Exam  Vitals reviewed.   Constitutional:       General: She is not in acute distress.     Appearance: Normal appearance.   HENT:      Head: Normocephalic and atraumatic.   Eyes:      General: No scleral icterus.        Right eye: No discharge.         Left eye: No discharge.   Cardiovascular:      Rate and Rhythm: Normal rate.      Heart sounds: No murmur heard.  Pulmonary:      Effort: Pulmonary effort is normal.   Abdominal:      General: Abdomen is flat. There is no distension.   Musculoskeletal:      Cervical back: Neck supple.      Right lower leg: No edema.      Left lower leg: No edema.   Skin:     Findings: No rash. Neurological:      General: No focal deficit present.      Mental Status: She is alert.      Cranial Nerves: No cranial nerve deficit.   Psychiatric:         Mood and Affect: Mood normal.         Thought Content: Thought content normal.              Assessment and Plan:  Alicia Sharp?is a 65 y.o.?female?with history of breast cancer who presents today?for follow up. The following was discussed below. RTC in 3 months or sooner if needed.   ?  Health Maintenance  -?Cervical Cancer Screening: last pap smear unknown. History of irregular pap smears.   - She reports history of abnormal pap smears,?reported normal pap smear in 2007. Pap smear 01/18/2019 wnl, neg high-risk HPV  - Diabetes Screening: A1c?5.3 on 12/01/2018  - Chlamydia, Gonorrhea Screening: declined  - Colonoscopy 04/07/2019 without polyps. Repeat in?/10 years  -?Shingles vaccine?administered on 6/8, 2nd ordered?  - Completed COVID vaccine - first dose 10/14/2019, last dose 11/11/2019, Pfizer  - HIV non-reactive 05/2020  Plan:  > Physical exam done on 3/14  > Pneumonia vaccine done    Osteopenia  - BMD on 09/06/2020 with osteopenia  Plan:  > Counseled on exercise and calcium and vit D supplementation  > Repeat in 1 year. Currently on anastrazole.   ?  Coronary Artery Disease  Hyperlipidemia  - Calcium score 22 on 5/11; 4/21 total cholesterol 218, LDL 143  -?vitamin D3 and folic acid by her oncologist.?  - Repeat Lipid panel?05/2019 with cholesterol improved to 168  - CT on 10/05/2019 with CAD  - Repeat Lipid panel 12/28/2019 with cholesterol improved to 137, LDL 66 on atorvastatin 20 mg   Plan:  > Continue atorvastatin 20 mg.   ?  Pulmonary Nodules  - CT scan 5/11?pulmonary nodules and post-surgical changes in her breast on her CT.  -?Given her history of breast cancer,?notified?medical, surgical, and radiation oncologists   -?Repeat CT scan in?3 months with stable nodules.   - CT on 09/2019 with unchanged sub centimeter left lung nodules since 12/2018. F/u in 18-24 months.    Plan:  >  Repeat CT ordered.   ?  Normocytic Anemia  - Hemoglobin 11.8, MCV 94.2 on 12/01/2018; Folic acid > 22.3, Vit B12 425  - Iron studies 05/2019 with iron 74, % sat 20, TIBC 362, ferritin 12  - No overt signs of bleeding. Colonoscopy 8/26 without polyps.  - Peripheral smear 06/02/2019 with no qualitative morphologic abnormalities.?Repeat CBC, peripheral smear 05/2020 with hgb stable 11.8, Iron studies with IDA on 05/2020  Plan:  > Continue iron 325 q other day. Repeat in 05/2021  ?  Breast Cancer  -?s/p lumpectomy and RT. She follows with Dr. Biagio Borg  - on anastrazole therapy.    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     Obesity  - on noom for the past year  - No history of medullary . No thyroid cancer in the family. No MEN syndrome  Plan:  > Counseled on lifestyle modifications including healthy dieting on exercise  > Counseled on different medications including Metformin, topiramate, GLP-1 agonists and stimulants.  We discussed the different risks and side effect profiles of each.  She is currently interested in pursuing phentermine.  The risks and benefits associated with this medication were discussed.  We will trial phentermine 15 mg daily for a total of 30 days.  We will follow-up in 1 month.      Paresthesias;?Numbess and Tingling  Morton's Neuroma  - She states that she will hold off on further treatment at this time.   ?  Patient discussed with Dr. Monte Fantasia.  ?  Denny Levy, M.D.  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 Harken is a female who presented to the [facility] on 07/16/2018 at age 63 for evaluation of her left breast cancer. Her screening mammograms in November 2019 showed a spiculated mss in the left breast and diagnosed mammogram redemonstrated the mass with suspicious calcifications. Ultrasound showed a 1.8 cm irregular mass corresponding with the mammogram finding and biopsy was recommended. Left breast biopsy 07/02/18 revealed a grade 2, IDC at 12:00. Left lumpectomy/SLNB 08/17/2018. Surgical pathology showed a 1.8 cm grade 2, IDC. There were 2 negative SLNs. She completed radiation therapy with Dr. Haskel Khan 10/12/2018 and she was started on Arimidex.     Breast Imaging:  Mammogram:   -- Screening mammograms 06/30/18 [facility]) showed scattered fibroglandular tissue. There was large spiculated mass with surrounding architectural distortion at 12:00, 6-7 cm posterior to the nipple. No suspicious calcifications were seen.   -- Left diagnostic mammogram 07/01/18 [facility]) redemonstrated a left breast mass without accompanying suspicious calcification.   -- Bilateral diagnostic mammogram 07/17/18 ([place]) revealed no suspicious masses, calcifications or architectural distortion present to suggest malignancy within the right breast. The left breast was significant for an irregular spiculated high density mass within the upper left breast at 12:00, middle depth measuring at least 1.7 cm. A  biopsy clip is seen along the anterior superior margin of the mass. There are no associated calcifications. This is consistent with biopsy-proven invasive ductal carcinoma. There is a posteriorly displaced ribbon-shaped biopsy clip within the upper left breast at 12:00, far posterior depth. This biopsy clip is 3.5 cm posterior to the malignant mass. There is no additional suspicious mammographic finding seen within the left breast.  Ultrasound:   -- Left breast ultrasound 07/01/18 [facility]) showed a 1.8 cm irregular hypoechoic ovoid mass at 12:00, 3 cm FTN corresponding with the abnormality seen on mammogram. No suspicious lymphadenopathy was seen in the axilla. BIRADS 5.   -- Left breast ultrasound 07/17/18 () revealed biopsy-proven malignant left breast mass at 12:00, 3 cm from the nipple measuring up to 2.0 cm. No morphologically suspicious left axillary lymph nodes. No additional suspicious mammographic or sonographic finding within either breast.    Reproductive health:  Age at Menarche:  50  Age at First Live Birth:  37  Age at Menopause:  58  Gravida: 3   Para: 2  Breastfeeding: 2 years      Procedure: Left lumpectomy/SLNB, 08/17/2018 [facility])  Pertinent PMH: Negative  Family History: Maternal aunt had breast cancer, deceased at age 68    Medical Oncology: Dr. Biagio Borg  Present Therapy: Arimidex  Referred by: Gardiner Barefoot, ARNP          Chronic Knee Pain (Resolved)

## 2020-10-23 NOTE — Progress Notes
Answers for HPI/ROS submitted by the patient on 10/16/2020  What medical problem brings you in to see the provider?: routine physical  Abdominal pain: No  No appetite: No  Joint Pain: Yes  Change in bowel habit: No  Chest pain: No  Chills: No  Congestion: No  Cough: No  Sweating much more than normal: No  Fatigue: No  Fever: No  Headaches: No  Joint swelling: Yes  Muscle pain: No  Nausea: No  Neck pain: No  Numbness: Yes  Rash: No  Sore throat: No  Swollen glands: No  Urinary symptoms: No  Vertigo: No  Visual change: No  Vomiting: No  Weakness: No

## 2020-10-24 ENCOUNTER — Encounter: Admit: 2020-10-24 | Discharge: 2020-10-24 | Payer: MEDICARE

## 2020-10-24 DIAGNOSIS — Z0000 Encounter for general adult medical examination without abnormal findings: Secondary | ICD-10-CM

## 2020-10-24 DIAGNOSIS — R918 Other nonspecific abnormal finding of lung field: Secondary | ICD-10-CM

## 2020-11-22 ENCOUNTER — Encounter: Admit: 2020-11-22 | Discharge: 2020-11-22 | Payer: MEDICARE

## 2020-11-23 ENCOUNTER — Encounter: Admit: 2020-11-23 | Discharge: 2020-11-23 | Payer: MEDICARE

## 2020-11-23 ENCOUNTER — Ambulatory Visit: Admit: 2020-11-23 | Discharge: 2020-11-24 | Payer: MEDICARE

## 2020-11-23 DIAGNOSIS — M1990 Unspecified osteoarthritis, unspecified site: Secondary | ICD-10-CM

## 2020-11-23 DIAGNOSIS — E785 Hyperlipidemia, unspecified: Secondary | ICD-10-CM

## 2020-11-23 DIAGNOSIS — J701 Chronic and other pulmonary manifestations due to radiation: Secondary | ICD-10-CM

## 2020-11-23 DIAGNOSIS — R918 Other nonspecific abnormal finding of lung field: Secondary | ICD-10-CM

## 2020-11-23 DIAGNOSIS — J302 Other seasonal allergic rhinitis: Secondary | ICD-10-CM

## 2020-11-23 DIAGNOSIS — E669 Obesity, unspecified: Secondary | ICD-10-CM

## 2020-11-23 DIAGNOSIS — C50919 Malignant neoplasm of unspecified site of unspecified female breast: Secondary | ICD-10-CM

## 2020-11-23 DIAGNOSIS — Z0000 Encounter for general adult medical examination without abnormal findings: Secondary | ICD-10-CM

## 2020-11-23 DIAGNOSIS — R202 Paresthesia of skin: Secondary | ICD-10-CM

## 2020-11-23 DIAGNOSIS — E7800 Pure hypercholesterolemia, unspecified: Secondary | ICD-10-CM

## 2020-11-23 MED ORDER — PHENTERMINE 30 MG PO CAP
30 mg | ORAL_CAPSULE | Freq: Every morning | ORAL | 0 refills | Status: AC
Start: 2020-11-23 — End: ?

## 2020-11-23 MED ORDER — PHENTERMINE 30 MG PO CAP
30 mg | ORAL_CAPSULE | Freq: Every morning | ORAL | 0 refills | Status: DC
Start: 2020-11-23 — End: 2020-11-23

## 2020-11-23 NOTE — Progress Notes
I performed a history and brief physical examination of Alicia Sharp  and discussed the management with Dr. Valerie Salts, MD at the time of the visit.  I agree with the evaluation and plan as documented by this resident, unless noted below, or in the resident's note in bold, blue italics.     - covid booster (2nd one) today  - increasing phentermine to 30mg   - will see our NP in 1 month to discuss phentermine/weight loss       Encounter Diagnoses   Name Primary?    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  Future Appointments   Date Time Provider [place]   11/29/2020 11:15 AM CT - IC IC1CT [facility] Radiolog   12/20/2020 10:00 AM Bonci, Si Gaul, APRN-NP [facility] IM   04/10/2021 11:30 AM Prager, Edwinna Areola, APRN-NP [place] Exam     Rosaria Ferries, MD

## 2020-11-23 NOTE — Patient Instructions
Please don't hesitate to call if you have any problems or questions. My nurse CB can be reached at [phone].      If Alicia Sharp does not answer, please leave a voicemail. Out of respect for patient/call volume, please only call once in a 24 hour period and my nurse will return your call as soon as he can.  You may also message [place] in [place]. Please note that it may take up to two business days to reply to [employer].     For refills on medications, please have your pharmacy fax a refill authorization request form to our office at Fax: [phone]. Please allow at least 3 business days for refill requests.     For urgent issues after business hours/weekends/holidays call [phone] and request for the outpatient internal medicine physician to be paged.    We offer same day appointments for your acute health concerns. These appointments are on a first come, first serve basis. Please call [phone] if you would like to make an appointment. If I am not available, you can see any of my partners or try to see me the next day (call at 8am).      Take care,     Dr. Kate Sable, Dr.Huded, and [facility]    Future Appointments   Date Time Provider [place]   11/29/2020 11:15 AM CT - IC IC1CT ICC Radiolog   04/10/2021 11:30 AM Prager, Edwinna Areola, APRN-NP [facility] [place] Exam

## 2020-11-23 NOTE — Progress Notes
Date of Service: 11/23/2020    Alicia Sharp is a 65 y.o. female. DOB: 12-19-1955   MRN#: [id number]    Subjective:       History of Present Illness  Alicia Sharp is a 65 y.o. female. She has history of breast cancer and follows with Dr. Biagio Borg.  She is currently taking anastrozole.  She had a BMD in January 2022 which showed osteopenia. She has discussed this with Dr. Biagio Borg and they are planning on getting a repeat BMD in 1 year. She started a weight exercising classes online, weights at eight which she enjoys.  She has been trying to eat more foods with calcium.  ?  She was previously diagnosed with Morton's neuroma and does not wish to pursue any surgery.  She is interested in trying acupuncture. She states that her symptoms do not prevent her from doing her daily activities.  She states that it does not affect her quality of life.    Interval History 4/14:  She continues to try to lose weight. Still using zoom. Was started on phentermine during her last visit. She is tolerating the 15 mg dose. The previous weight from 3/14 was 167 using her home scale. Her weight this morning on her own scale is 167 lbs. She denies any issue with high blood pressure, insomnia, or increased energy.  She would like to try increased dose of phentermine.  She is trying to make modifications to her diet.  She states that she eats what ever she cooks.  She is trying to eat more when she is actually hungry.  She has tried to increase her steps.  In the last week, she hit 10,000 steps 4 times.  She is planning to start with the [employer] row[employer] team this Saturday and she is very excited about it.    Last week, she developed some post nasal drainage and sore throat.  She had a COVID test which was negative.  She says that it is overall improving.  Denies any fever or chills.  Denies any sick contacts.          Review of Systems   Constitutional: Negative for fever.   HENT: Positive for sore throat.    Eyes: Negative for blurred vision.   Cardiovascular: Negative for chest pain.   Respiratory: Negative for cough and shortness of breath.    Endocrine: Negative for polydipsia.   Hematologic/Lymphatic: Negative for bleeding problem.   Skin: Negative for rash.   Musculoskeletal: Negative for falls.   Gastrointestinal: Negative for constipation, diarrhea, nausea and vomiting.   Genitourinary: Negative for dysuria and hematuria.   Neurological: Negative for dizziness.   Psychiatric/Behavioral: The patient does not have insomnia.    Allergic/Immunologic: Negative.          Objective:     ? anastrozole (ARIMIDEX) 1 mg tablet Take 1 mg by mouth daily.   ? atorvastatin (LIPITOR) 40 mg tablet Take one-half tablet by mouth daily.   ? cholecalciferol (VITAMIN D-3) 1,000 units tablet    ? ferrous sulfate (FEOSOL) 325 mg (65 mg iron) tablet Take one tablet by mouth daily. Take on an empty stomach at least 1 hour before or 2 hours after food.   ? meloxicam (MOBIC) 15 mg tablet TAKE 1 TABLET BY MOUTH DAILY   ? phentermine (ADIPEX-P) 15 mg capsule Take one capsule by mouth daily after breakfast. Administer 1-2 hours after breakfast.  Swallow whole.     Vitals:    11/23/20 0950  BP: 129/84   Pulse: 76   SpO2: 97%   PainSc: Zero   Weight: 77.7 kg (171 lb 6.4 oz)   Height: 157.5 cm (5' 2)     Body mass index is 30.54 kg/m?Marland Kitchen     Physical Exam  Vitals reviewed.   HENT:      Head: Normocephalic and atraumatic.      Mouth/Throat:      Lips: No lesions.      Mouth: No injury.      Pharynx: Oropharynx is clear. Uvula midline. No pharyngeal swelling, oropharyngeal exudate, posterior oropharyngeal erythema or uvula swelling.   Eyes:      General:         Right eye: No discharge.         Left eye: No discharge.   Cardiovascular:      Rate and Rhythm: Normal rate.   Pulmonary:      Effort: No respiratory distress.   Abdominal:      General: There is no distension.   Musculoskeletal:      Cervical back: Neck supple.      Right lower leg: No edema.      Left lower leg: No edema.   Skin:     Findings: No rash.   Neurological:      General: No focal deficit present.      Mental Status: She is alert.   Psychiatric:         Mood and Affect: Mood normal.         Thought Content: Thought content normal.              Assessment and Plan:  Dalin Keath Bown?is a 65 y.o.?female?with history of breast cancer who presents today?for follow up. The following was discussed below. RTC in 1 mo with NP for AWV and follow up for phenteramine.   ?  Health Maintenance  -?Cervical Cancer Screening: last pap smear unknown. History of irregular pap smears.   - She reports history of abnormal pap smears,?reported normal pap smear in 2007. Pap smear 01/18/2019 wnl, neg high-risk HPV  - Diabetes Screening: A1c?5.3 on 12/01/2018  - Chlamydia, Gonorrhea Screening: declined  - Colonoscopy 04/07/2019 without polyps. Repeat in?/10 years  -?Shingles vaccine?administered on 6/8, 2nd ordered?  - Completed COVID vaccine - first dose 10/14/2019, last dose 11/11/2019, Pfizer  - HIV non-reactive 05/2020  Plan:  > AWV with NP at next visit.   > States that she may need a doctor's signature in order to participate in the rowing team.  She says that the current link is not working.  She states that she will send [place] this on MyChart.  I told her that I can sign it when she sends it.    Obesity  - On noom for the past year  - No history of medullary . No thyroid cancer in the family. No MEN syndrome  Plan:  > Counseled on lifestyle modifications including healthy dieting on exercise  > Counseled on different medications including Metformin, topiramate, GLP-1 agonists and stimulants.  We discussed the different risks and side effect profiles of each. Tolerated phentermine 15 mg. Will increase to 30 mg and follow up with NP in 1 month    Sore Throat  - No erythema, purulent drainage noted on exam. Overall, reports that symptoms are improving.  Will monitor for now.  Patient instructed to notify team if she develops any worsening of symptoms or failure to improve.  ?  Osteopenia  -  BMD on 09/06/2020 with osteopenia  Plan:  > Counseled on exercise and calcium and vit D supplementation  > Repeat in 1 year. Currently on anastrazole.   ?  Coronary Artery Disease  Hyperlipidemia  - Calcium score 22 on 5/11; 4/21 total cholesterol 218, LDL 143  -?vitamin D3 and folic acid by her oncologist.?  -?Repeat Lipid panel?05/2019 with cholesterol improved to 168  - CT on 10/05/2019 with CAD  - Repeat Lipid panel 12/28/2019 with cholesterol improved to 137, LDL 66 on atorvastatin 20 mg?  Plan:  > Continue atorvastatin 20 mg.?  ?  Pulmonary Nodules  - CT scan 5/11?pulmonary nodules and post-surgical changes in her breast on her CT.  -?Given her history of breast cancer,?notified?medical, surgical, and radiation oncologists   -?Repeat CT scan in?3 months with stable nodules.?  - CT on 09/2019 with unchanged sub centimeter left lung nodules since 12/2018. F/u in 18-24 months.??  Plan:  > Repeat CT ordered at previous visit  ?  Normocytic Anemia  - Hemoglobin 11.8, MCV 94.2 on 12/01/2018; Folic acid > 22.3, Vit B12 191  - Iron studies 05/2019 with iron 74, % sat 20, TIBC 362, ferritin 12  - No overt signs of bleeding. Colonoscopy 8/26 without polyps.  - Peripheral smear 06/02/2019 with no qualitative morphologic abnormalities.?Repeat CBC, peripheral smear?05/2020 with hgb stable 11.8, Iron studies with IDA on 05/2020  Plan:  > Continue iron 325 q other day. Repeat in 05/2021  ?  Breast Cancer  -?s/p lumpectomy and RT. She follows with Dr. Biagio Borg  - on anastrazole therapy.  ?  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   ??  Paresthesias;?Numbess and Tingling  Morton's Neuroma  - She states that she will hold off on further treatment at this time.   ?  Patient discussed with Dr.?Huded.  ?  Denny Levy, M.D.  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

## 2020-11-27 ENCOUNTER — Encounter: Admit: 2020-11-27 | Discharge: 2020-11-27 | Payer: MEDICARE

## 2020-11-27 ENCOUNTER — Ambulatory Visit: Admit: 2020-11-27 | Discharge: 2020-11-27 | Payer: MEDICARE

## 2020-11-27 DIAGNOSIS — R590 Localized enlarged lymph nodes: Secondary | ICD-10-CM

## 2020-11-27 DIAGNOSIS — R918 Other nonspecific abnormal finding of lung field: Secondary | ICD-10-CM

## 2020-11-27 LAB — POC CREATININE, RAD: Lab: 0.8 mg/dL (ref 0.4–1.00)

## 2020-11-27 MED ORDER — SODIUM CHLORIDE 0.9 % IJ SOLN
50 mL | Freq: Once | INTRAVENOUS | 0 refills | Status: CP
Start: 2020-11-27 — End: ?
  Administered 2020-11-27: 15:00:00 50 mL via INTRAVENOUS

## 2020-11-27 MED ORDER — IOHEXOL 350 MG IODINE/ML IV SOLN
65 mL | Freq: Once | INTRAVENOUS | 0 refills | Status: CP
Start: 2020-11-27 — End: ?
  Administered 2020-11-27: 15:00:00 65 mL via INTRAVENOUS

## 2020-11-27 NOTE — Progress Notes
Dr. Kate Sable: fyi only    Drs. Tyler Pita, and Savioz: fyi only; her breast cancer was on the left, and this axillary/subpectoral lymphadenopathy is on the right.    CB: please d/w patient:  "Hi Keyoni,  The good news is that the lung nodules are stable and we do not need additional images in the future for the lung nodules.    There are slightly enlarged lymph nodes at the right armpit and chest.  We often see this after a viral illness or sometimes after shaving that causes irritation.  But we should get an ultrasound (which I've ordered) for further evaluation.  Please let us know if you are not contacted this week for the [facility],  The good news is that the lung nodules are stable and we do not need additional images in the future for the lung nodules.    There are slightly enlarged lymph nodes at the right armpit and chest.  We often see this after a viral illness or sometimes after shaving that causes irritation.  But we should get an ultrasound (which I've ordered) for further evaluation.  Please let us know if you are not contacted this week for the ultrasound.    Thanks!    Dr. Lemmie Evens

## 2020-11-28 ENCOUNTER — Encounter: Admit: 2020-11-28 | Discharge: 2020-11-28 | Payer: MEDICARE

## 2020-11-28 DIAGNOSIS — R590 Localized enlarged lymph nodes: Secondary | ICD-10-CM

## 2020-11-29 ENCOUNTER — Encounter: Admit: 2020-11-29 | Discharge: 2020-11-29 | Payer: MEDICARE

## 2020-11-30 ENCOUNTER — Encounter: Admit: 2020-11-30 | Discharge: 2020-11-30 | Payer: MEDICARE

## 2020-12-07 ENCOUNTER — Ambulatory Visit: Admit: 2020-12-07 | Discharge: 2020-12-07 | Payer: MEDICARE

## 2020-12-07 ENCOUNTER — Encounter: Admit: 2020-12-07 | Discharge: 2020-12-07 | Payer: MEDICARE

## 2020-12-07 DIAGNOSIS — R590 Localized enlarged lymph nodes: Secondary | ICD-10-CM

## 2020-12-07 NOTE — Telephone Encounter
Phone call to Ms. Wisnewski to inform her that her right axillary ultrasound showed normal appearing lymph nodes, and the lymph nodes on CT were felt to be reactive due to her COVID vaccination.  She was appreciative and had no questions.      Sharion Settler, PA-C

## 2020-12-20 ENCOUNTER — Encounter: Admit: 2020-12-20 | Discharge: 2020-12-20 | Payer: MEDICARE

## 2020-12-20 ENCOUNTER — Ambulatory Visit: Admit: 2020-12-20 | Discharge: 2020-12-21 | Payer: MEDICARE

## 2020-12-20 DIAGNOSIS — C50919 Malignant neoplasm of unspecified site of unspecified female breast: Secondary | ICD-10-CM

## 2020-12-20 DIAGNOSIS — Z0000 Encounter for general adult medical examination without abnormal findings: Secondary | ICD-10-CM

## 2020-12-20 DIAGNOSIS — J302 Other seasonal allergic rhinitis: Secondary | ICD-10-CM

## 2020-12-20 DIAGNOSIS — E785 Hyperlipidemia, unspecified: Secondary | ICD-10-CM

## 2020-12-20 DIAGNOSIS — M1990 Unspecified osteoarthritis, unspecified site: Secondary | ICD-10-CM

## 2020-12-20 MED ORDER — MELOXICAM 15 MG PO TAB
ORAL_TABLET | Freq: Every day | 1 refills
Start: 2020-12-20 — End: ?

## 2020-12-20 MED ORDER — ANASTROZOLE 1 MG PO TAB
ORAL_TABLET | Freq: Every day | ORAL | 3 refills | 33.00000 days | Status: AC
Start: 2020-12-20 — End: ?

## 2020-12-20 MED ORDER — ATORVASTATIN 40 MG PO TAB
20 mg | ORAL_TABLET | Freq: Every day | ORAL | 3 refills | Status: AC
Start: 2020-12-20 — End: ?

## 2020-12-20 MED ORDER — PHENTERMINE 30 MG PO CAP
30 mg | ORAL_CAPSULE | Freq: Every morning | ORAL | 0 refills | Status: AC
Start: 2020-12-20 — End: ?

## 2020-12-20 NOTE — Progress Notes
Answers for HPI/ROS submitted by the patient on 12/13/2020  What medical problem brings you in to see the provider?: follow-up on phentermine  Your symptom is...: chronic  When did they start?: more than 1 year ago  How often do you feel this symptom?: constantly  How has the symptom changed?: gradually improving  On a scale of 0 to 10 (10 being the worst), how strong is your pain?: 0/10  How severe is your pain?: no pain  Which of the following, if any, make(s) your symptom worse?: eating  If you've tried a treatment for this problem, how much relief did you experience?: mild

## 2020-12-20 NOTE — Progress Notes
Date of Service: 12/20/2020  MRN#: [id number]  DOB: 02/08/1956  PCP: Denny Levy B    Subjective:   Chief Complaint   Patient presents with   ? Medication Follow-up     Alicia Sharp is a 65 y.o.female patient who presents for weight management.     Patient Reported Other  What medical problem brings you in to see the provider? follow-up on phentermine. This is a chronic problem. The current episode started more than 1 year ago. The problem occurs constantly. The problem has been gradually improving. The pain score is: 0/10. The pain severity is no pain.  The symptoms are aggravated by eating. The treatment provided mild relief.     Patient presents today for follow-up weight management.  Was seen last month in clinic and her dose of phentermine was increased from 15 mg to 30 mg daily.  Patient states she is tolerating this medication well, however she is concerned about its efficacy.  When she was seen in clinic on 11/23/2020, she states her weight at home that day was 167.8 pounds.  When she weighed herself at home this morning she was 166.6 pounds.  She does weigh herself first thing in the morning on an empty stomach with no clothing.  She denies any increase in hypertension, racing heart, insomnia, or excess energy.  Does feel like her blood pressure is a little bit higher than her baseline.  She does report a decrease in appetite.  She is practicing intermittent fasting, which she has done in the past.  Will usually not eat until about 10 AM, but will wait until noon if she is not hungry.  She tries to listen to her body's hunger cues.  When she goes out to eat, she is able to stop eating when she is full.  Has a harder time with this at home.  Tries to portion things.  She is still doing Noom, mostly for tracking water intake.  She has met with the rowing team a couple times, however [place] red [place] so they are currently unable to practice.  She does still walk daily.  Aims for 10,000 steps a day.  She would like to continue phentermine for 1 more month and discuss alternative medications at next visit.    Medical History:   Diagnosis Date   ? Arthritis    ? Breast cancer (HCC) 07/2018    left breast   ? Hyperlipidemia    ? Seasonal allergies      Review of Systems   see HPI  Objective:     Medication:  ? anastrozole (ARIMIDEX) 1 mg tablet Take 1 mg by mouth daily.   ? atorvastatin (LIPITOR) 40 mg tablet Take one-half tablet by mouth daily.   ? cholecalciferol (VITAMIN D-3) 1,000 units tablet    ? ferrous sulfate (FEOSOL) 325 mg (65 mg iron) tablet Take one tablet by mouth daily. Take on an empty stomach at least 1 hour before or 2 hours after food.   ? meloxicam (MOBIC) 15 mg tablet TAKE 1 TABLET BY MOUTH DAILY   ? phentermine (FASTIN) 30 mg capsule Take one capsule by mouth every morning for 30 days.       Vitals:    12/20/20 0954   BP: 127/70   BP Source: Arm, Right Upper   Pulse: 70   Temp: (!) 35.7 ?C (96.2 ?F)   Resp: 16   SpO2: 100%   TempSrc: Temporal  PainSc: Zero   Weight: 75.6 kg (166 lb 9.6 oz)   Height: 157.5 cm (5' 2.01)       Body Sharp index is 30.46 kg/m?Marland Kitchen    Physical Exam  Constitutional:       Appearance: Normal appearance.   HENT:      Head: Normocephalic and atraumatic.   Cardiovascular:      Rate and Rhythm: Normal rate and regular rhythm.      Pulses: Normal pulses.      Heart sounds: Normal heart sounds. No murmur heard.  Pulmonary:      Effort: Pulmonary effort is normal. No respiratory distress.      Breath sounds: Normal breath sounds.   Musculoskeletal:         General: Normal range of motion.      Cervical back: Normal range of motion.   Skin:     General: Skin is warm and dry.   Neurological:      General: No focal deficit present.      Mental Status: She is alert. Mental status is at baseline.   Psychiatric:         Mood and Affect: Mood normal.          Assessment and Plan:    1. Encounter for weight management  2. Obesity (BMI 30-39.9)  - On noom for the past year  - No history of medullary . No thyroid cancer in the family. No MEN syndrome  - Will continue phentermine for one more month and then consider switching to different weight loss medication; can discuss switching to a different weight loss medication at next visit - Metformin, topiramate, GLP-1 agonist   - Continue lifestyle and diet modifications; aim for 30 minutes exercise 5 days per week  - phentermine (FASTIN) 30 mg capsule; Take one capsule by mouth every morning for 30 days.  Dispense: 30 capsule; Refill: 0    3. Pure hypercholesterolemia   4. Health care maintenance  - atorvastatin (LIPITOR) 40 mg tablet; Take one-half tablet by mouth daily.  Dispense: 45 tablet; Refill: 3  - COMPREHENSIVE METABOLIC PANEL; Future  - CBC AND DIFF; Future  - TSH WITH FREE T4 REFLEX; Future  - IRON + BINDING CAPACITY + %SAT+ FERRITIN; Future    Orders Placed This Encounter   ? LIPID PROFILE today   ? COMPREHENSIVE METABOLIC PANEL   ? CBC AND DIFF   ? TSH WITH FREE T4 REFLEX   ? IRON + BINDING CAPACITY + %SAT+ FERRITIN   ? atorvastatin (LIPITOR) 40 mg tablet   ? phentermine (FASTIN) 30 mg capsule     Total of 30 minutes were spent on the same day of the visit including preparing to see the patient, obtaining and/or reviewing separately obtained history, performing a medically appropriate examination and/or evaluation, counseling and educating the patient/family/caregiver, ordering medications, tests, or procedures, referring and communication with other health care professionals, documenting clinical information in the electronic or other health record, independently interpreting results and communicating results to the patient/family/caregiver, and care coordination. I used a dictation program when conducting this note. Due to this, there may be grammatical errors or inconsistencies. For clarification or questions, please contact me.   Alicia Sharp, Alicia Sharp  Visit Disposition     Dispositions Check-out Note ? Return in about 4 weeks (around 01/17/2021) for In-Person.   Weight management follow-up         Future Appointments   Date Time Provider Department Center  01/22/2021 10:30 AM Alicia Sharp, Alicia Sharp, Alicia Sharp [facility]   04/10/2021 11:30 AM Alicia Sharp, Alicia Sharp, Alicia Sharp [facility] [place] Exam     Patient Instructions     It was nice to see you today! Thank you for coming into our clinic.    Today we came up with the following plan:    - Get labs when fasting.   - I refilled your phentermine for 30 days, please schedule a follow-up appointment in 4 weeks.   - I refilled your atorvastatin (Lipitor) to treat your cholesterol.     Please notify the clinic or go to the emergency room if you develop sudden worsening of symptoms, fevers, chest pain, difficulty breathing, or other concerning symptoms.     Please call my nurse if you have any further questions or concerns, at [phone]. You can leave a voicemail if needed. You can also reach [place] by sending a secure message through MyChart.    Helpful Information:  ? We will contact you by MyChart or phone with any test results when they are available.   ? If you are expecting results and have not heard from [place] within 2 weeks of your testing, please send a MyChart message or call the clinic.  ? Please use the MyChart Refill request or contact your pharmacy directly to request medication refills. - Please allow at least 72 hours for refill requests.  ? For appointments: Our Scheduling phone number is [phone].   ? If you were expecting a phone call to schedule a referral and haven't heard from the referring office within two weeks, please let us know.    Take care,  Alicia Ravel, Alicia Sharp, Alicia Sharp, Alicia Sharp      Future Appointments   Date Time Provider Department Center   04/10/2021 11:30 AM Alicia Sharp, Alicia Sharp, Alicia Sharp [facility] [place] Exam

## 2020-12-21 DIAGNOSIS — E669 Obesity, unspecified: Secondary | ICD-10-CM

## 2020-12-21 DIAGNOSIS — E7800 Pure hypercholesterolemia, unspecified: Secondary | ICD-10-CM

## 2020-12-21 DIAGNOSIS — Z7689 Persons encountering health services in other specified circumstances: Secondary | ICD-10-CM

## 2020-12-22 ENCOUNTER — Encounter: Admit: 2020-12-22 | Discharge: 2020-12-22 | Payer: MEDICARE

## 2020-12-22 NOTE — Progress Notes
Dr. Foye Deer agreed to one more refill of Meloxicam, but a note was sent to pharmacy with refill stating all future refill requests need to be directed to PCP due to the need to monitor kidney function. MD 5/13

## 2020-12-25 ENCOUNTER — Encounter: Admit: 2020-12-25 | Discharge: 2020-12-25 | Payer: MEDICARE

## 2020-12-25 ENCOUNTER — Ambulatory Visit: Admit: 2020-12-25 | Discharge: 2020-12-25 | Payer: MEDICARE

## 2020-12-25 DIAGNOSIS — Z0000 Encounter for general adult medical examination without abnormal findings: Secondary | ICD-10-CM

## 2020-12-25 DIAGNOSIS — E7800 Pure hypercholesterolemia, unspecified: Secondary | ICD-10-CM

## 2020-12-25 LAB — CBC AND DIFF
ABSOLUTE BASO COUNT: 0 K/UL (ref 0–0.20)
ABSOLUTE EOS COUNT: 0.1 K/UL (ref 0–0.45)
ABSOLUTE LYMPH COUNT: 1 K/UL (ref 1.0–4.8)
ABSOLUTE MONO COUNT: 0.3 K/UL (ref 0–0.80)
ABSOLUTE NEUTROPHIL: 2.3 K/UL (ref 1.8–7.0)
BASOPHILS %: 1 % (ref 0–2)
EOSINOPHILS %: 4 % (ref >60)
HEMATOCRIT: 35 % — ABNORMAL LOW (ref 36–45)
LYMPHOCYTES %: 25 % (ref 24–44)
MCH: 32 pg (ref 26–34)
MCHC: 34 g/dL (ref 32.0–36.0)
MCV: 95 FL (ref 80–100)
MONOCYTES %: 10 % (ref 4–12)
MPV: 7.7 FL (ref 7–11)
NEUTROPHILS %: 60 % — ABNORMAL HIGH (ref 41–77)
PLATELET COUNT: 230 K/UL (ref 150–400)
RDW: 12 % (ref 11–15)
WBC COUNT: 3.9 K/UL — ABNORMAL LOW (ref 4.5–11.0)

## 2020-12-25 LAB — LIPID PROFILE
CHOLESTEROL: 140 mg/dL (ref <200)
NON HDL CHOLESTEROL: 83 mg/dL — ABNORMAL LOW (ref 4.0–5.0)
TRIGLYCERIDES: 58 mg/dL (ref <150)

## 2020-12-25 LAB — COMPREHENSIVE METABOLIC PANEL
POTASSIUM: 4.4 MMOL/L (ref <100)
SODIUM: 143 MMOL/L (ref >40)

## 2020-12-25 LAB — TSH WITH FREE T4 REFLEX: TSH: 1.1 uU/mL (ref 0.35–5.00)

## 2020-12-25 LAB — IRON + BINDING CAPACITY + %SAT+ FERRITIN
IRON BINDING: 311 ug/dL (ref 270–380)
IRON: 91 ug/dL (ref 50–160)

## 2020-12-27 ENCOUNTER — Encounter: Admit: 2020-12-27 | Discharge: 2020-12-27 | Payer: MEDICARE

## 2020-12-27 DIAGNOSIS — D649 Anemia, unspecified: Secondary | ICD-10-CM

## 2021-01-01 ENCOUNTER — Encounter: Admit: 2021-01-01 | Discharge: 2021-01-01 | Payer: MEDICARE

## 2021-01-09 ENCOUNTER — Encounter: Admit: 2021-01-09 | Discharge: 2021-01-09 | Payer: MEDICARE

## 2021-01-21 ENCOUNTER — Encounter: Admit: 2021-01-21 | Discharge: 2021-01-21 | Payer: MEDICARE

## 2021-01-21 NOTE — Progress Notes
Date of Service: 01/22/2021  MRN#: [id number]  DOB: 1956/03/07  PCP: Denny Levy B    Subjective:   Chief Complaint   Patient presents with   ? Follow Up     4week weight loss     Alicia Sharp is a 65 y.o.female patient who presents for weight management.     Patient Reported Other  What medical problem brings you in to see the provider? follow-up appt.. This is a chronic problem. The current episode started more than 1 year ago. The problem occurs constantly. The problem has been gradually improving. The pain score is: 0/10. The pain severity is no pain.  Pertinent negatives include no abdominal pain, anorexia, arthralgias, change in bowel habit, chest pain, chills, congestion, coughing, diaphoresis, fatigue, fever, headaches, joint swelling, myalgias, nausea, neck pain, numbness, rash, sore throat, swollen glands, urinary symptoms, vertigo, visual change, vomiting or weakness. Nothing aggravates the symptoms. She has tried nothing for the symptoms. The treatment provided no relief.      Patient presents today for follow-up weight management.  She is taking 30 mg phentermine daily.  Patient states she is tolerating this medication well, however she is concerned about its efficacy.  On 5/11//2022, she states her weight at home that day was 166.6 pounds.  When she weighed herself at home this morning she was 163.6 pounds.  She does weigh herself first thing in the morning on an empty stomach with no clothing.  She denies any increase in hypertension, racing heart, insomnia, or excess energy.  Does feel like her blood pressure is a little bit higher than her baseline.  She does report a decrease in appetite.  She is practicing intermittent fasting, which she has done in the past.  Will usually not eat until about 10 AM, but will wait until noon if she is not hungry.  She tries to listen to her body's hunger cues.  When she goes out to eat, she is able to stop eating when she is full.  Has a harder time with this at home.  Tries to portion things.  She is still doing [employer], mostly for tracking water intake.  Tries to drink 10 8oz glasses a day.  Did start paddling, has had one real practice.  She does still walk daily, aims for 10,000 steps a day.  She is interested in trying a different medication that phentermine.  She denies any history of thyroid cancer or pancreatitis.     Medical History:   Diagnosis Date   ? Arthritis    ? Breast cancer (HCC) 07/2018    left breast   ? Hyperlipidemia    ? Seasonal allergies      Review of Systems   Constitutional: Negative for chills, diaphoresis, fatigue and fever.   HENT: Negative for congestion and sore throat.    Respiratory: Negative for cough.    Cardiovascular: Negative for chest pain.   Gastrointestinal: Negative for abdominal pain, anorexia, change in bowel habit, nausea and vomiting.   Musculoskeletal: Negative for arthralgias, joint swelling, myalgias and neck pain.   Skin: Negative for rash.   Neurological: Negative for vertigo, weakness, numbness and headaches.      see HPI  Objective:     Medication:  ? anastrozole (ARIMIDEX) 1 mg tablet TAKE ONE TABLET BY MOUTH DAILY.   ? atorvastatin (LIPITOR) 40 mg tablet Take one-half tablet by mouth daily.   ? cholecalciferol (VITAMIN D-3) 1,000 units tablet    ? ferrous sulfate (FEOSOL)  325 mg (65 mg iron) tablet Take one tablet by mouth daily. Take on an empty stomach at least 1 hour before or 2 hours after food.   ? meloxicam (MOBIC) 15 mg tablet Take one tablet by mouth daily.   ? semaglutide (weight loss) (WEGOVY) 0.25 mg/0.5 mL injector PEN Inject one-quarter mg under the skin every 7 days.       Vitals:    01/22/21 1016 01/22/21 1058   BP: (!) 142/52 133/57   Pulse: 81    Temp: 37.2 ?C (99 ?F)    Resp: 16    SpO2: 98%    PainSc: Zero    Weight: 75.4 kg (166 lb 3.2 oz)    Height: 162.6 cm (5' 4)        Body mass index is 28.53 kg/m?Marland Kitchen    Physical Exam  Constitutional:       Appearance: Normal appearance.   HENT:      Head: Normocephalic and atraumatic.   Cardiovascular:      Rate and Rhythm: Normal rate and regular rhythm.      Pulses: Normal pulses.      Heart sounds: Normal heart sounds. No murmur heard.  Pulmonary:      Effort: Pulmonary effort is normal. No respiratory distress.      Breath sounds: Normal breath sounds.   Musculoskeletal:         General: Normal range of motion.      Cervical back: Normal range of motion.   Skin:     General: Skin is warm and dry.   Neurological:      General: No focal deficit present.      Mental Status: She is alert. Mental status is at baseline.   Psychiatric:         Mood and Affect: Mood normal.          Assessment and Plan:    Encounter for weight management  BMI 28.0-28.9,adult  Pure hypercholesterolemia  - Four pound weight loss with phentermine in 3 months, will discontinue; patient would still like to drop some weight - will try Wegovy, if unapproved with insurance, will try metformin  - Continue lifestyle and diet modifications; aim for 30 minutes exercise 5 days per week  - Follow-up in 4 weeks    Orders Placed This Encounter   ? meloxicam (MOBIC) 15 mg tablet   ? semaglutide (weight loss) (WEGOVY) 0.25 mg/0.5 mL injector PEN     Total of 30 minutes were spent on the same day of the visit including preparing to see the patient, obtaining and/or reviewing separately obtained history, performing a medically appropriate examination and/or evaluation, counseling and educating the patient/family/caregiver, ordering medications, tests, or procedures, referring and communication with other health care professionals, documenting clinical information in the electronic or other health record, independently interpreting results and communicating results to the patient/family/caregiver, and care coordination. I used a dictation program when conducting this note. Due to this, there may be grammatical errors or inconsistencies. For clarification or questions, please contact me.   Steward Ros, APRN-NP  Visit Disposition     Dispositions Check-out Note    ? Return in about 4 weeks (around 02/19/2021) for In-Person.   With Duwayne Heck or Dr. Monte Fantasia         Future Appointments   Date Time Provider Department Center   04/10/2021 11:30 AM Prager, Terrance Mass, APRN-NP [facility]     Patient Instructions     It was nice  to see you today! Thank you for coming into our clinic.    Today we came up with the following plan:    - Get CBC today.    - I sent a prescription to your pharmacy for [facility]. It is for weight loss and you will inject it once a week.   - Follow-up in 4 weeks.     Please notify the clinic or go to the emergency room if you develop sudden worsening of symptoms, fevers, chest pain, difficulty breathing, or other concerning symptoms.     Please call my nurse if you have any further questions or concerns, at [phone]. You can leave a voicemail if needed. You can also reach [place] by sending a secure message through MyChart.    Helpful Information:  ? We will contact you by MyChart or phone with any test results when they are available.   ? If you are expecting results and have not heard from [place] within 2 weeks of your testing, please send a MyChart message or call the clinic.  ? Please use the MyChart Refill request or contact your pharmacy directly to request medication refills. - Please allow at least 72 hours for refill requests.  ? For appointments: Our Scheduling phone number is [phone].   ? If you were expecting a phone call to schedule a referral and haven't heard from the referring office within two weeks, please let us know.    Take care,  Ailene Ravel, DNP, APRN, FNP-BC      Future Appointments   Date Time Provider Department Center   04/10/2021 11:30 AM Prager, Terrance Mass, APRN-NP [facility] [place] Exam

## 2021-01-22 ENCOUNTER — Encounter: Admit: 2021-01-22 | Discharge: 2021-01-22 | Payer: MEDICARE

## 2021-01-22 ENCOUNTER — Ambulatory Visit: Admit: 2021-01-22 | Discharge: 2021-01-23 | Payer: MEDICARE

## 2021-01-22 ENCOUNTER — Ambulatory Visit: Admit: 2021-01-22 | Discharge: 2021-01-22 | Payer: MEDICARE

## 2021-01-22 DIAGNOSIS — Z7689 Persons encountering health services in other specified circumstances: Secondary | ICD-10-CM

## 2021-01-22 DIAGNOSIS — M1990 Unspecified osteoarthritis, unspecified site: Secondary | ICD-10-CM

## 2021-01-22 DIAGNOSIS — E785 Hyperlipidemia, unspecified: Secondary | ICD-10-CM

## 2021-01-22 DIAGNOSIS — J302 Other seasonal allergic rhinitis: Secondary | ICD-10-CM

## 2021-01-22 DIAGNOSIS — M13869 Other specified arthritis, unspecified knee: Secondary | ICD-10-CM

## 2021-01-22 DIAGNOSIS — E7800 Pure hypercholesterolemia, unspecified: Secondary | ICD-10-CM

## 2021-01-22 DIAGNOSIS — D649 Anemia, unspecified: Secondary | ICD-10-CM

## 2021-01-22 DIAGNOSIS — C50919 Malignant neoplasm of unspecified site of unspecified female breast: Secondary | ICD-10-CM

## 2021-01-22 LAB — CBC AND DIFF
ABSOLUTE BASO COUNT: 0 K/UL (ref 0–0.20)
ABSOLUTE EOS COUNT: 0.1 K/UL (ref 0–0.45)
ABSOLUTE LYMPH COUNT: 1.5 K/UL (ref 1.0–4.8)
ABSOLUTE MONO COUNT: 0.4 K/UL (ref 0–0.80)
ABSOLUTE NEUTROPHIL: 2.7 K/UL (ref 1.8–7.0)
BASOPHILS %: 1 % (ref 0–2)
EOSINOPHILS %: 3 % (ref 0–5)
HEMATOCRIT: 36 % (ref 36–45)
HEMOGLOBIN: 11 g/dL — ABNORMAL LOW (ref 12.0–15.0)
LYMPHOCYTES %: 31 % (ref 24–44)
MCH: 31 pg (ref 26–34)
MCHC: 32 g/dL (ref 32.0–36.0)
MCV: 95 FL (ref 80–100)
MONOCYTES %: 10 % (ref 4–12)
PLATELET COUNT: 250 K/UL (ref 150–400)
RBC COUNT: 3.7 M/UL — ABNORMAL LOW (ref 4.0–5.0)
RDW: 12 % (ref 11–15)
WBC COUNT: 4.9 K/UL (ref 4.5–11.0)

## 2021-01-22 MED ORDER — MELOXICAM 15 MG PO TAB
15 mg | ORAL_TABLET | Freq: Every day | ORAL | 1 refills | 30.00000 days | Status: AC
Start: 2021-01-22 — End: ?

## 2021-01-22 MED ORDER — WEGOVY 0.25 MG/0.5 ML SC PNIJ
.25 mg | SUBCUTANEOUS | 0 refills | Status: AC
Start: 2021-01-22 — End: ?

## 2021-01-22 NOTE — Telephone Encounter
(  [employer] Jun) - [id number]  YPPJKD 0.25MG /0.5ML auto-injectors

## 2021-01-23 ENCOUNTER — Encounter: Admit: 2021-01-23 | Discharge: 2021-01-23 | Payer: MEDICARE

## 2021-01-23 DIAGNOSIS — Z7689 Persons encountering health services in other specified circumstances: Secondary | ICD-10-CM

## 2021-01-23 DIAGNOSIS — Z6828 Body mass index (BMI) 28.0-28.9, adult: Secondary | ICD-10-CM

## 2021-01-24 ENCOUNTER — Encounter: Admit: 2021-01-24 | Discharge: 2021-01-24 | Payer: MEDICARE

## 2021-01-24 NOTE — Telephone Encounter
Primary Care Pharmacy High Cost Medication Evaluation     Alicia Sharp was referred to the primary care pharmacist due to concerns with the cost of their medication(s).   Referring provider: [facility]    Medication(s) with cost concerns: GLP-1  Cost of medication per patient: $1400  Patient current supply and urgency of medication need:   Insurance: Theatre manager: NA    Plan:   The prior authorization for Mancel Parsons has been denied for the following reason:     "We denied this request under Medicare Part D because: Your plan's Medicare Part D drug plan cannot cover drugs used for loss of appetite (anorexia), weight loss, or weight gain. Your Medicare Part D drug plan was asked to cover the requested drug. The requested drug is used for loss of appetite (anorexia), weight loss, or to help you gain weight. Under section 1927(d)(2) of the Social Security Act, drugs used for loss of appetite (anorexia), weight loss, or weight gain are excluded from coverage under the Medicare Part D drug benefit. Since the requested drug is excluded from Medicare Part D coverage, the request for coverage under your Medicare Part D benefit is denied".    Larita Fife

## 2021-01-25 ENCOUNTER — Encounter: Admit: 2021-01-25 | Discharge: 2021-01-25 | Payer: MEDICARE

## 2021-01-26 ENCOUNTER — Encounter: Admit: 2021-01-26 | Discharge: 2021-01-26 | Payer: MEDICARE

## 2021-01-29 ENCOUNTER — Encounter: Admit: 2021-01-29 | Discharge: 2021-01-29 | Payer: MEDICARE

## 2021-02-08 ENCOUNTER — Encounter: Admit: 2021-02-08 | Discharge: 2021-02-08 | Payer: MEDICARE

## 2021-02-08 DIAGNOSIS — E785 Hyperlipidemia, unspecified: Secondary | ICD-10-CM

## 2021-02-08 DIAGNOSIS — Z6828 Body mass index (BMI) 28.0-28.9, adult: Secondary | ICD-10-CM

## 2021-02-08 MED ORDER — OZEMPIC 0.25 MG OR 0.5 MG(2 MG/1.5 ML) SC PNIJ
.25 mg | SUBCUTANEOUS | 0 refills | Status: AC
Start: 2021-02-08 — End: ?

## 2021-03-09 ENCOUNTER — Encounter: Admit: 2021-03-09 | Discharge: 2021-03-09 | Payer: MEDICARE

## 2021-03-09 DIAGNOSIS — M13869 Other specified arthritis, unspecified knee: Secondary | ICD-10-CM

## 2021-03-09 DIAGNOSIS — M1990 Unspecified osteoarthritis, unspecified site: Secondary | ICD-10-CM

## 2021-03-09 DIAGNOSIS — D649 Anemia, unspecified: Secondary | ICD-10-CM

## 2021-03-09 MED ORDER — MELOXICAM 15 MG PO TAB
15 mg | ORAL_TABLET | Freq: Every day | ORAL | 0 refills | 30.00000 days | Status: AC
Start: 2021-03-09 — End: ?

## 2021-03-09 MED ORDER — MELOXICAM 15 MG PO TAB
15 mg | ORAL_TABLET | Freq: Every day | ORAL | 0 refills
Start: 2021-03-09 — End: ?

## 2021-03-09 NOTE — Telephone Encounter
Requested Prescriptions   Pending Prescriptions Disp Refills   ? meloxicam (MOBIC) 15 mg tablet [Pharmacy Med Name: MELOXICAM 15MG ] 30 tablet 0     Sig: TAKE ONE TABLET BY MOUTH DAILY.       Analgesics: NSAIDS & COX2 Inhibitors Failed - 03/09/2021 10:15 AM        Failed - This refill cannot be delegated        Failed - HGB in normal range and within 360 days     Hemoglobin   Date Value Ref Range Status   01/22/2021 11.8 (L) 12.0 - 15.0 GM/DL Final   57/84/6962 95.2 12.0 - 15.0 GM/DL Final              Passed - Valid encounter within last 12 months     Recent Visits  Date Type Provider Dept   01/22/21 Office Visit Steward Ros, APRN-NP [facility]   12/20/20 Office Visit Flanagin, Clide Dales, APRN-NP [facility]   11/23/20 Office Visit Leonor Liv, MD [facility]   10/23/20 Office Visit Leonor Liv, MD [facility]   04/10/20 Office Visit Telehealth Leonor Liv, MD [facility]   09/27/19 Office Visit Telehealth Leonor Liv, MD [facility]   04/12/19 Office Visit Leonor Liv, MD [facility]   Showing recent visits within past 720 days and meeting all other requirements  Future Appointments  No visits were found meeting these conditions.  Showing future appointments within next 360 days and meeting all other requirements            Passed - BP completed in the last 12 months     BP Readings from Last 2 Encounters:   01/22/21 133/57   12/20/20 127/70             Passed - Cr in normal range and within 360 days     Creatinine   Date Value Ref Range Status   12/25/2020 0.82 0.4 - 1.00 MG/DL Final   84/13/2440 1.02 0.4 - 1.00 MG/DL Final     Creatinine, POC   Date Value Ref Range Status   11/27/2020 0.8 0.4 - 1.00 MG/DL Final   72/53/6644 0.8 0.4 - 1.00 MG/DL Final              Passed - eGFR in normal range and within 360 days     eGFR Non African American   Date Value Ref Range Status   12/01/2018 >60 >60 mL/min Final Comment:     The eGFR is not validated for use in drug dosing adjustments.  Continue to use   estimated creatinine clearance per dosing reference text.  Please contact the   Clinical Pharmacist for questions.     08/14/2018 >60 >60 mL/min Final     Comment:     The eGFR is not validated for use in drug dosing adjustments.  Continue to use   estimated creatinine clearance per dosing reference text.  Please contact the   Clinical Pharmacist for questions.       eGFR African American   Date Value Ref Range Status   12/01/2018 >60 >60 mL/min Final     Comment:     The eGFR is not validated for use in drug dosing adjustments.  Continue to use   estimated creatinine clearance per dosing reference text.  Please contact the  Clinical Pharmacist for questions.     08/14/2018 >60 >60 mL/min Final     Comment:     The eGFR is not validated for use in drug dosing adjustments.  Continue to use   estimated creatinine clearance per dosing reference text.  Please contact the   Clinical Pharmacist for questions.       eGFR   Date Value Ref Range Status   12/25/2020 >60 >60 mL/min Final     Comment:     eGFR calculated using the CKD-EPIcr_R equation

## 2021-03-12 ENCOUNTER — Encounter: Admit: 2021-03-12 | Discharge: 2021-03-12 | Payer: MEDICARE

## 2021-03-19 ENCOUNTER — Encounter: Admit: 2021-03-19 | Discharge: 2021-03-19 | Payer: MEDICARE

## 2021-03-19 ENCOUNTER — Ambulatory Visit: Admit: 2021-03-19 | Discharge: 2021-03-20 | Payer: MEDICARE

## 2021-03-19 DIAGNOSIS — J302 Other seasonal allergic rhinitis: Secondary | ICD-10-CM

## 2021-03-19 DIAGNOSIS — Z6828 Body mass index (BMI) 28.0-28.9, adult: Principal | ICD-10-CM

## 2021-03-19 DIAGNOSIS — M1990 Unspecified osteoarthritis, unspecified site: Secondary | ICD-10-CM

## 2021-03-19 DIAGNOSIS — E785 Hyperlipidemia, unspecified: Secondary | ICD-10-CM

## 2021-03-19 DIAGNOSIS — C50919 Malignant neoplasm of unspecified site of unspecified female breast: Secondary | ICD-10-CM

## 2021-03-19 NOTE — Progress Notes
Annual Wellness Visit Providing Personalized Prevention Plan Services   Alicia Sharp is 65 y.o. patient who is presenting for annual wellness visit today.    First or subsequent AWV? Initial   Health Risk Assessment was conducted during the wellness visit and assessed the following functional and safety areas below:    Advance care planning documents? Yes - living will, DPOA   What kind of home do you live in? (i.e. house, apartment, assisted living, etc.)  House   Who do you live with? None   Do you need assistance at home with activities of daily living such as feeding, dressing, bathing, cooking, medication management, etc?     No   Do you have any concerns related to your hearing?      No   Do you use any assistive devices such as a cane, walker, wheelchair, etc?      No   Do you drive? If so, any driving concerns such as accidents, tickets, etc.?     Yes, no   How would you rate your overall health  Very good   Have you fallen in the last month?      No   Any potential fall hazards at home such as rugs in the hallway, lacking grab bars in the bathroom, lacking handrails on the stairs, poor lighting, etc.?     No   Have you been hospitalized in the last month?      No   Do you wear a seatbelt in the car? Yes   Do you wear sunscreen and/or sun protective clothing when you are outside?      Yes  Recommended wearing sunscreen and / or sun protective clothing any time patient is in the sun for >15 minutes      Would you like a referral to a nutritionist to discuss healthy diet or any specific diet recommendations?   Estimated body mass index is 29.35 kg/m? as calculated from the following:    Height as of this encounter: 162.6 cm (5' 4).    Weight as of this encounter: 77.6 kg (171 lb).   Wt Readings from Last 5 Encounters:   03/19/21 77.6 kg (171 lb)   01/22/21 75.4 kg (166 lb 3.2 oz)   12/20/20 75.6 kg (166 lb 9.6 oz)   11/23/20 77.7 kg (171 lb 6.4 oz)   10/23/20 75.8 kg (167 lb)       No  Discussed BMI with patient - BMI is 29.35     Any concerns related to your mobility (i.e. walking, balance, getting up from a chair or bed, etc)      No   Get up and go test Less than 30 seconds    Do you have any memory or cognitive concerns that you would like further testing or evaluation for?  No   Three item recall test Immediate and delayed   Draw a clock test Pass   Do you drink alcohol? Yes, 7 drinks/week - one glass of wine/day  Recommended no more than 7 standard drinks per week on average OR more than 3 drinks on any day    Are you a current or former smoker? No   Would you like to be screened for any sexually transmitted diseases today? No     Lung Cancer Screening assessment eligibility    Smoking history - see above     Age  Age between 74-44 years old Patient's age: 65 y.o.  Yes  Asymptomatic Patient has no symptoms of lung cancer or a previous diagnosis of lung cancer?  No   Smoking history amount Tobacco smoking history of at least 30 pack-years (one pack-year = smoking one pack per day for one year; 1 pack = 20 cigarettes); No   Smoking status Current smoker or one who has quit smoking within the last 15 years; No   Screening Decision: If all answers are YES, then discuss and order Referral for Lung Cancer Screening.    I am referring Alicia Sharp for a lung cancer screening.  Alicia Sharp [facility] will be screened with CMS-mandated eligibility requirements.   If she meets the criteria, she will receive a shared decision-making counseling visit including education and decision aids for low dose CT (LDCT) as outlined by CMS, smoking cessation counseling, and will be appropriately consented.   The patient will be managed through the Lung Cancer Screening Program for all follow-ups and annual lung cancer LDCT screening.  Referral to Lung Cancer Screening Program:  No       Depression screening  -PHQ9 score of 0 today - no follow-up or recommendations are necessary at this time, reach out with any new concerns or worsening   -At least 8 minutes spent on performing / evaluating / discussing depression screening today   Patient Scores:  PHQ-2: PHQ-2 Score: 0 (03/18/2021  6:23 PM)   PHQ-9: No data recorded  Interventions:  PHQ-2: PHQ-2 Score less than 3: No follow-up or recommendations are necessary at this time (11/23/2020  9:51 AM)   Depression Interventions PHQ-2/9: No data recorded    Patient Assessment  Vitals:    03/19/21 1141   BP: 133/67   BP Source: Arm, Right Upper   Pulse: 64   Resp: 16   SpO2: 99%   PainSc: Zero   Weight: 77.6 kg (171 lb)   Height: 162.6 cm (5' 4)     Mood: within normal limits  affect /Appearance: well developed, well nourished, in no acute distress  Family member/caregiver with patient during wellness visit today: None    5-10 year preventive service plan was discussed with the patient as below : a copy was given to the patient (available via mychart) with explanation of  preventive services in the next 5-10 years:     Preventive Service Frequency  Last Done    Body Mass Index (BMI)  Height   Weight  Annually Estimated body mass index is 29.35 kg/m? as calculated from the following:    Height as of this encounter: 162.6 cm (5' 4).    Weight as of this encounter: 77.6 kg (171 lb).   Blood Pressure  At least annually  BP Readings from Last 1 Encounters:   03/19/21 133/67      Vision Exam   Annually  5/22   Dental Exam   Every 6-12 months  needs to schedule and re-establish   Cholesterol Testing   Regularly beginning at age 46 with risk factors Lab Results   Component Value Date    CHOL 140 12/25/2020    TRIG 58 12/25/2020    HDL 57 12/25/2020    LDL 79 12/25/2020    VLDL 12 12/25/2020    NONHDLCHOL 83 12/25/2020        Diabetes Screening    With a sustained BP >/= 135/80 mm Hg or with other risk factors  No results found for: HGBA1C  POC:  Lab Results   Component Value Date/Time    A1C  5.3 10/23/2020 12:00 AM    A1C 5.3 01/18/2019 12:00 AM      Colorectal Cancer Screening Age 70 to 13  Colonoscopy every 10 years or per individualized recommendations, OR Cologuard / FIT/DNA testing every 3 years  04/07/19; next due 04/06/29   Hepatitis C screening  Once in lifetime for adults age 81 to 21 01/18/19   Immunizations:     Pneumococcal (Pneumonia) Vaccine     Influenza (Flu) Vaccine    Pneumonia: 1-2 doses up to age 33;  Pneumonia: 1 dose age 45+  Influenza: Due annually every October 1st Immunization History   Administered Date(s) Administered   ? COVID-19 (PFIZER tris-sucrose) mRNA vacc, 30 mcg/0.3 mL (PF) 11/23/2020   ? COVID-19 (PFIZER), mRNA vacc, 30 mcg/0.3 mL (PF) 10/14/2019, 11/11/2019, 07/04/2020   ? Flu Vaccine =>3 YO (Historical) 07/16/2010, 07/16/2018   ? Flu Vaccine =>6 Months Quadrivalent PF 05/31/2019   ? Flu Vaccine =>65 YO High-Dose Quadrivalent (PF) 06/27/2020   ? Flu Vaccine Cell Cult Quad 4+YO (PF)(Flucelvax) 06/17/2017   ? H1N1 Vaccine 06/15/2008   ? Hepatitis A Vaccine 03/13/2020   ? Hepatitis A vaccine Adult IM 09/15/2020   ? Pneumococcal Vaccine (23-Val Adult) 07/16/2010, 10/23/2020   ? Tdap Vaccine 09/08/2015   ? Varicella-Zoster Vaccine - live (ZOSTAVAX) 04/30/2016, 05/17/2016   ? Zoster Vaccine Recombinant, Adjuvanted (shingles) IM (vial 2 of 2)(SHINGRIX) 09/02/2017, 01/18/2019, 04/12/2019   ? Zoster Vaccine Recombinant, adjuvant suspension component (vial 1 of 2)(SHINGRIX) 01/18/2019, 04/12/2019        MALES: Abdominal Aortic Aneurysm Once, between the age of 58-75 and smoked 100+ cigarettes in lifetime NA   FEMALES: Breast Cancer Screening (Mammogram)   Every 1-2 yrs, age 45-74  07/04/20, next due 07/04/21   FEMALES: Cervical Cancer Screening (Pap Smear)  Every 3 yrs, aged 21-29;  Every 5 yrs, age 90-65 with HPV co-testing NA   FEMALES: Osteoporosis Screening (Bone Density Measurement)  At least once for all women 34 or older, repeat testing as needed for abnormal results  09/06/20   Depression screening and alcohol misuse screening above   03/19/2021      List of current providers and suppliers:   The list of current providers that are regularly involved in providing medical care to this patient is:   Patient Care Team:  Dimitri Ped, MD as PCP - General (Internal Medicine)  Ignatius Specking, MD (Surgery)  O'Dea, Daneil Dan, MD (Medical Oncology)  De Blanch, MD (Radiation Oncology)  Dimitri Ped, MD as CCP - Continuity of Care Provider (Internal Medicine)  Dionisi, Tacey Ruiz, MD as Primary Care Manager (Internal Medicine)    Medical and family history:   Medical History:   Diagnosis Date   ? Arthritis    ? Breast cancer (HCC) 07/2018    left breast   ? Hyperlipidemia    ? Seasonal allergies      Surgical History:   Procedure Laterality Date   ? LEFT RADIOACTIVE SEED LOCALIZED LUMPECTOMY Left 08/17/2018    Performed by Ignatius Specking, MD at [facility]   ? IDENTIFICATION SENTINEL LYMPH NODE Left 08/17/2018    Performed by Ignatius Specking, MD at [facility]   ? INJECTION RADIOACTIVE TRACER FOR SENTINEL NODE IDENTIFICATION Left 08/17/2018    Performed by Ignatius Specking, MD at [facility]   ? LEFT SENTINEL LYMPH NODE BIOPSY Left 08/17/2018    Performed by Ignatius Specking, MD at [facility]   ? COLONOSCOPY DIAGNOSTIC WITH SPECIMEN  COLLECTION BY BRUSHING/ WASHING - FLEXIBLE N/A 04/07/2019    Performed by Onnie Boer, MD at [facility]   ? HX ROTATOR CUFF REPAIR      R 08/2010. L 09/2015.   ? HX WISDOM TEETH EXTRACTION     ? KNEE ARTHROSCOPY Left     2014     She indicated that the status of her mother is unknown. She indicated that the status of her father is unknown. She indicated that the status of her maternal grandfather is unknown. She indicated that the status of her maternal aunt is unknown.    No Known Allergies  Social History     Socioeconomic History   ? Marital status: Widowed   ? Number of children: 2   Occupational History   ? Occupation: retired   Tobacco Use   ? Smoking status: Never Smoker   ? Smokeless tobacco: Never Used   ? Tobacco comment: briefly in college   Substance and Sexual Activity   ? Alcohol use: Yes     Alcohol/week: 5.0 standard drinks     Types: 5 Glasses of wine per week     Comment: 1 glass of wine/evening   ? Drug use: Never   Social History Narrative    Here with daughter Denny Peon     Current Outpatient Medications on File Prior to Visit   Medication Sig Dispense Refill   ? anastrozole (ARIMIDEX) 1 mg tablet TAKE ONE TABLET BY MOUTH DAILY. 90 tablet 3   ? atorvastatin (LIPITOR) 40 mg tablet Take one-half tablet by mouth daily. 45 tablet 3   ? cholecalciferol (VITAMIN D-3) 1,000 units tablet      ? ferrous sulfate (FEOSOL) 325 mg (65 mg iron) tablet Take one tablet by mouth daily. Take on an empty stomach at least 1 hour before or 2 hours after food. 90 tablet    ? meloxicam (MOBIC) 15 mg tablet Take one tablet by mouth daily for 90 days. 90 tablet 0   ? semaglutide (OZEMPIC) 0.25 mg or 0.5 mg(2 mg/1.5 mL) injection PEN Inject one-quarter mg under the skin every 7 days. 1.5 mL 0     No current facility-administered medications on file prior to visit.       List of risk factors and interventions:   Patient was given specific education handouts as below related to sun safety, fire safety, driving safety, suicide risk, osteoporosis prevention, and falls prevention. Patient was able to ask questions about those risks and what the patient can do to avoid or prevent risks.   ===========================================================  Patient Education Handout (paper copy given to patient today)     Thank you for completing your Medicare Annual Wellness Visit!  Our plan from today:  - We will do an annual wellness visit each year to make sure we are keeping your health items up to date and doing everything we can to keep you safe and healthy  - If you requested or agreed to a nutritionist referral this has been placed, if you do not hear from someone to schedule this please call our main scheduling number at [phone]  - If you requested a social work referral to discuss advanced care planning (living will, DPOA, etc) our social worker will reach out to you    Sun Safety  - The sun's ultraviolet (UV) rays can damage your skin in as little as 15 minutes  - Skin cancer is the most common cancer in the [place]  -  Wear sunscreen and sun-protective clothing whenever you will be outside for 15 minutes or longer   - Sunscreen should have an SPF of at least 15 and reapply every 2 hours or after swimming  - When possible, wear clothing that covers your skin, large-brimmed hats to protect your head, face, and ears, sunglasses to protect your eyes, and seek shade as much as possible     Training and development officer  The Facts:  ? Cooking is the primary cause of home fires.  ? Smoking is the leading cause of fire-related deaths.  ? 80% of [place] fire deaths occur in home fires.  ? 50% of home fire deaths occur in homes without smoke alarms.  ? Most home fires occur during winter months.  ? Alcohol contributes to an estimated 40% of home related fire deaths.  Who is at greatest risk?  ? Children under the age of 86.  ? Adults 49 and older.  ? African Americans and Native Americans.  ? Persons living in rural areas.  ? Persons living in manufactured homes or substandard housing.  What can you do?  ? Be sure your home is equipped with a functioning Smoke and Carbon Monoxide alarm.  ? Do not smoke.  ? Do not drink to excess.  ? Monitor your stove, oven, and kitchen appliances.  ? Have a functioning fire extinguisher in your kitchen.    Driving Safety  The Facts:  ? Motor vehicle-related deaths and injuries among older adults are rising.  ? Drivers 69 and older have higher crash death rates per mile than all but teen drivers.  ? The 2 and older population is the fastest growing segment of the population.  ? Older drivers who are injured in a motor vehicle accident are more likely than younger drivers to die of their injuries.  ? Rates for motor vehicle-related injury are twice as high for older men than for older women.  What can you do?  ? Wear you seatbelt in the car -- all the time.  ? Be sure that your vision and hearing have been tested and are satisfactory.  ? Do not drink and drive.  ? Talk with family about your driving skills and consider their advice when assessing your driving skills and safety.  ? Do not talk on a cell phone and drive at the same time.    Suicide Risk  The Facts:  ? Suicide rates increase with age and rates are high among those over 24.  ? Older adults who are suicidal are also more likely to be suffering from  ? Physical illnesses and to be divorced or widowed.  ? Older men are more likely to commit suicide than older women.  ? Firearms are used in the majority of suicides committed by older adults.  What can you do?  ? Seek care if you are depressed.  ? Seek social supports such as family, church, [employer], and friends if you are ill, divorced, or living alone.  Remember that depression is a medical illness and not a personal failing or weakness.  There are effective treatments for depression and your medical providers are interested in helping you if you are depressed.    Osteoporosis Prevention  The Facts:  ? Osteoporosis is more common among older adults.  ? Women have a higher rate of osteoporosis than men.  ? The presence of osteoporosis increases the risk of injury from a fall.  ? Smoking is a risk factor for the  development of osteoporosis.  What can you do?  ? Participate in a regular exercise program such as walking  ? Be sure that you are taking 1,000 to 1,500 mg of calcium per day, preferably with vitamin D  ? Do not smoke.  ? Do not drink alcohol to excess.  ? Consider having a bone density test to look for osteoporosis.    Fall Prevention  The Facts:  ? More than one-third of adults 50 and older fall each year.  ? Among older Americans, falls are the leading cause of injury deaths and the most common cause of non-fatal injury and hospital admission for trauma.  ? Of those older adults who fall, 20% to 30% suffer moderate to severe injuries such as hip fractures or head trauma that reduce mobility and independence, and increase the risk of premature death.  ? Falls are the leading cause of traumatic brain injuries.  ? Among older adults, the majority of fractures are caused by falls.  ? White men have the highest fall-related death rates, followed by white women, black men, and then black women.  ? Women sustain about 80% of all hip fractures.  ? Of all fall-related fractures, hip fractures cause the greatest number of deaths and lead to the most severe health problems and reduced quality of life.  ? Up to 25% of community-dwelling older adults who sustain a hip fracture remain institutionalized for at least one year.  Who is at risk?  ? As noted above, adults over the age of 60 have an increased risk, but that risk is even higher among those older than 61.  ? Those with lower body weakness.  ? Those with problems walking and balance.  ? Those who are on four or more medications or any psychoactive medications.  ? Those who drink excessively.  What can you do?  ? Increase lower body strength and balance through regular physical activity and exercise.  ? Review all of your medications with your provider regularly to see if any can be eliminated or the dose reduced.  ? Have your vision checked regularly.  ? Remove tripping hazards in your home such as clutter in the hallways and on the stairs.  ? Remove throw rugs.  ? Use non-slip mats in the bathtub and on shower floors.  ? Have grab bars next to the toilet and in the tub or shower.  ? Have handrails on both sides of the stairway  ? Be sure that the lighting is adequate throughout your home.  ? Do not drink alcohol to excess.  ? If you require the assistance of a cane or walker, use it all the time.  ============================================================    Health advice and referrals:   It was discussed with patient  preventive counseling services or programs aimed at reducing identified risk factors and improving self-management, or community-based lifestyle interventions to reduce health risks and promote self-management and wellness, including weight management, physical activity, smoking cessation, fall prevention, and nutrition.    VOLUNTARY: Advance directive :   It was discussed with the patient the importance of advance directive and how the patient can prepare an advance directive in the case where an injury or illness causes the individual to be unable to make health care decisions. The patient was also given the resource and the referral option to our clinic social worker to provide support and guidance on how to get an advance directive.     Staff conducting initial intake: Clide Dales  Kaisyn Millea, APRN-NP    I reviewed and approved orders and components above of annual wellness visit and the personalized prevention plan services for this patient.

## 2021-04-06 ENCOUNTER — Encounter: Admit: 2021-04-06 | Discharge: 2021-04-06 | Payer: MEDICARE

## 2021-04-06 ENCOUNTER — Ambulatory Visit: Admit: 2021-04-06 | Discharge: 2021-04-06 | Payer: MEDICARE

## 2021-04-06 DIAGNOSIS — R1032 Left lower quadrant pain: Secondary | ICD-10-CM

## 2021-04-06 DIAGNOSIS — M1990 Unspecified osteoarthritis, unspecified site: Secondary | ICD-10-CM

## 2021-04-06 DIAGNOSIS — C50919 Malignant neoplasm of unspecified site of unspecified female breast: Secondary | ICD-10-CM

## 2021-04-06 DIAGNOSIS — E785 Hyperlipidemia, unspecified: Secondary | ICD-10-CM

## 2021-04-06 DIAGNOSIS — M79644 Pain in right finger(s): Secondary | ICD-10-CM

## 2021-04-06 DIAGNOSIS — J302 Other seasonal allergic rhinitis: Secondary | ICD-10-CM

## 2021-04-06 DIAGNOSIS — D649 Anemia, unspecified: Secondary | ICD-10-CM

## 2021-04-06 LAB — CBC
HEMATOCRIT: 34 % — ABNORMAL LOW (ref 36–45)
HEMOGLOBIN: 11 g/dL — ABNORMAL LOW (ref 12.0–15.0)
MCV: 95 FL (ref 80–100)
MPV: 7.6 FL (ref 7–11)
PLATELET COUNT: 251 K/UL (ref 150–400)
RBC COUNT: 3.6 M/UL — ABNORMAL LOW (ref 4.0–5.0)
RDW: 12 % (ref 11–15)
WBC COUNT: 4.8 K/UL (ref 4.5–11.0)

## 2021-04-06 LAB — URINALYSIS MICROSCOPIC REFLEX TO CULTURE

## 2021-04-06 LAB — URINALYSIS DIPSTICK REFLEX TO CULTURE
GLUCOSE,UA: NEGATIVE
NITRITE: NEGATIVE
PROTEIN,UA: NEGATIVE
URINE ASCORBIC ACID, UA: NEGATIVE
URINE BILE: NEGATIVE
URINE BLOOD: NEGATIVE
URINE KETONE: NEGATIVE

## 2021-04-06 NOTE — Progress Notes
Date of Service: 04/06/2021  MRN#: [id number]  DOB: [birth year]  PCP: Dimitri Ped    Subjective:   Chief Complaint   Patient presents with   ? Follow Up     Knuckle of middle finger right hand still swollen, Patient also reports uncomfortable sensation on left side of body under ribs. More uncomfortable when pressed on. Doesn't feel right.      Alicia Sharp is a 65 y.o.female patient who presents for LLQ abdominal pain.     History of Present Illness    Patient presents today for pain to her left lower abdomen that has been present for about a month.  Symptoms have not necessarily gotten worse, but she has been noticing it more frequently.  Describes it more like a pressure sensation rather than pain.  It is intermittent, comes and goes.  Exacerbated by certain movements, but not by food.  She is able to eat and drink.  She denies any fevers.  She denies nausea, vomiting, diarrhea, or constipation.  She denies blood in her stools or urine.  Denies dark/sticky stools.  Denies dysuria, increased urinary frequency, urgency.  States that she has all her organs.  No history of diverticulitis or colitis.  No history of kidney stones.  Patient has a history of breast cancer.    Patient also sustained an injury to her right middle finger in June.  She still has some pain and swelling to the PIP joint of her right middle finger.  Has not had this evaluated.  She takes Mobic daily.  She is also tried Voltaren gel to that joint.    Medical History:   Diagnosis Date   ? Arthritis    ? Breast cancer (HCC) 07/2018    left breast   ? Hyperlipidemia    ? Seasonal allergies        Review of Systems   see HPI  Objective:     Medication:  ? anastrozole (ARIMIDEX) 1 mg tablet TAKE ONE TABLET BY MOUTH DAILY.   ? atorvastatin (LIPITOR) 40 mg tablet Take one-half tablet by mouth daily.   ? cholecalciferol (VITAMIN D-3) 1,000 units tablet    ? ferrous sulfate (FEOSOL) 325 mg (65 mg iron) tablet Take one tablet by mouth daily. Take on an empty stomach at least 1 hour before or 2 hours after food.   ? meloxicam (MOBIC) 15 mg tablet Take one tablet by mouth daily for 90 days.       Vitals:    04/06/21 1006   BP: 115/68   BP Source: Arm, Right Upper   Pulse: 62   Temp: 37.5 ?C (99.5 ?F)   SpO2: 98%   TempSrc: Oral   PainSc: Zero   Weight: 78.9 kg (174 lb)   Height: 162.6 cm (5' 4)       Body mass index is 29.87 kg/m?Marland Kitchen    Physical Exam  Constitutional:       General: She is not in acute distress.     Appearance: Normal appearance. She is not ill-appearing.   HENT:      Head: Normocephalic and atraumatic.   Cardiovascular:      Rate and Rhythm: Normal rate and regular rhythm.      Pulses: Normal pulses.      Heart sounds: Normal heart sounds.   Pulmonary:      Effort: Pulmonary effort is normal. No respiratory distress.      Breath sounds: Normal breath sounds.   Abdominal:  General: Abdomen is flat. Bowel sounds are normal. There is no distension.      Palpations: Abdomen is soft.      Tenderness: There is no abdominal tenderness. There is no right CVA tenderness, left CVA tenderness or guarding.   Musculoskeletal:      Right hand: Swelling (to right middle finger PIP joint) and bony tenderness (to right middle finger PIP joint) present. No tenderness. Decreased range of motion. Normal capillary refill. Normal pulse.      Cervical back: Normal range of motion.   Skin:     General: Skin is warm and dry.   Neurological:      General: No focal deficit present.      Mental Status: She is alert. Mental status is at baseline.   Psychiatric:         Mood and Affect: Mood normal.          Assessment and Plan:    Left lower quadrant abdominal pain  - Patient presents today for pain to her left lower abdomen that has been present for about a month, worsening in frequency  - Describes it more like a pressure sensation rather than pain; exacerbated by certain movements, but not by food, she is able to eat and drink; denies any fevers  - No blood in stool in urine  - No history of diverticulitis or colitis, no history of kidney stones; patient does have a history of breast cancer  - Will get CT of abdomen/pelvis and UA, will further develop plan of care based on results  - ER/return precautions given  - CT ABD/PELV WO/W CONTRAST; Future  - URINALYSIS DIPSTICK REFLEX TO CULTURE; Future  - URINALYSIS MICROSCOPIC REFLEX TO CULTURE; Future  - UA REFLEX LABEL; Future    Pain of right middle finger  - Patient sustained an injury to her right middle finger in June, still has some pain and swelling to the PIP joint of her right middle finger - as not had this evaluated  - Takes Mobic daily, tried Voltaren gel last night, continue  - PE did show swelling to PIP joint and inability of patient for make full fist with middle finger  - Will get x-ray of hand and further develop plan of care based on results  - HAND MIN 3 VIEWS RIGHT; Future      Orders Placed This Encounter   ? CT ABD/PELV WO/W CONTRAST   ? HAND MIN 3 VIEWS RIGHT   ? URINALYSIS DIPSTICK REFLEX TO CULTURE   ? URINALYSIS MICROSCOPIC REFLEX TO CULTURE   ? UA REFLEX LABEL       Total of 30 minutes were spent on the same day of the visit including preparing to see the patient, obtaining and/or reviewing separately obtained history, performing a medically appropriate examination and/or evaluation, counseling and educating the patient/family/caregiver, ordering medications, tests, or procedures, referring and communication with other health care professionals, documenting clinical information in the electronic or other health record, independently interpreting results and communicating results to the patient/family/caregiver, and care coordination. I used a dictation program when conducting this note. Due to this, there may be grammatical errors or inconsistencies. For clarification or questions, please contact me.   Steward Ros, APRN-NP    Future Appointments   Date Time Provider Department Center   04/06/2021 1:00 PM GENERAL ROOM 1-[facility]   04/24/2021  2:00 PM Prager, Terrance Mass, APRN-NP [id number] [place] Exam   07/02/2021  3:00 PM Dionisi, Tacey Ruiz,  MD [facility]     Patient Instructions     It was nice to see you today! Thank you for coming into our clinic.    Today we came up with the following plan:    - Get x-ray of hand and schedule CT of abdomen.     Please notify the clinic or go to the emergency room if you develop sudden worsening of symptoms, fevers, chest pain, difficulty breathing, or other concerning symptoms.     Please call my nurse if you have any further questions or concerns, at [phone]. You can leave a voicemail if needed. You can also reach [place] by sending a secure message through MyChart.    Helpful Information:  ? We will contact you by MyChart or phone with any test results when they are available.   ? If you are expecting results and have not heard from [place] within 2 weeks of your testing, please send a MyChart message or call the clinic.  ? Please use the MyChart Refill request or contact your pharmacy directly to request medication refills. - Please allow at least 72 hours for refill requests.  ? For appointments: Our Scheduling phone number is [phone].   ? If you were expecting a phone call to schedule a referral and haven't heard from the referring office within two weeks, please let us know.    Take care,  Ailene Ravel, DNP, APRN, FNP-BC      Future Appointments   Date Time Provider Department Center   04/06/2021  1:00 PM GENERAL ROOM [facility]   04/24/2021  2:00 PM Prager, Terrance Mass, APRN-NP [id number] [place] Exam   07/02/2021  3:00 PM Dionisi, Tacey Ruiz, MD [facility]

## 2021-04-09 ENCOUNTER — Encounter: Admit: 2021-04-09 | Discharge: 2021-04-09 | Payer: MEDICARE

## 2021-04-09 ENCOUNTER — Ambulatory Visit: Admit: 2021-04-09 | Discharge: 2021-04-09 | Payer: MEDICARE

## 2021-04-09 DIAGNOSIS — R1032 Left lower quadrant pain: Secondary | ICD-10-CM

## 2021-04-09 LAB — POC CREATININE, RAD: CREATININE, POC: 1 mg/dL (ref 0.4–1.00)

## 2021-04-09 MED ORDER — SODIUM CHLORIDE 0.9 % IJ SOLN
50 mL | Freq: Once | INTRAVENOUS | 0 refills | Status: CP
Start: 2021-04-09 — End: ?
  Administered 2021-04-09: 20:00:00 50 mL via INTRAVENOUS

## 2021-04-09 MED ORDER — IOHEXOL 350 MG IODINE/ML IV SOLN
100 mL | Freq: Once | INTRAVENOUS | 0 refills | Status: CP
Start: 2021-04-09 — End: ?
  Administered 2021-04-09: 20:00:00 100 mL via INTRAVENOUS

## 2021-04-19 NOTE — Patient Instructions
Thank you for coming to see us today.   Please call our office or send a message through MyChart if you have any questions or concerns.          Dr. Anne O'Dea and Michelle Prager, APRN  [phone] (Nurses: Mineola Duan/Bailee Deviney/Gina Crimi)  [phone] (fax)    [facility] Location  [address]  [place], [place] [address]    [facility]  [address]  [place], [place] [address]

## 2021-04-24 ENCOUNTER — Encounter: Admit: 2021-04-24 | Discharge: 2021-04-24 | Payer: MEDICARE

## 2021-04-24 DIAGNOSIS — Z79811 Long term (current) use of aromatase inhibitors: Secondary | ICD-10-CM

## 2021-04-24 DIAGNOSIS — C50412 Malignant neoplasm of upper-outer quadrant of left female breast: Secondary | ICD-10-CM

## 2021-04-24 DIAGNOSIS — E785 Hyperlipidemia, unspecified: Secondary | ICD-10-CM

## 2021-04-24 DIAGNOSIS — M1990 Unspecified osteoarthritis, unspecified site: Secondary | ICD-10-CM

## 2021-04-24 DIAGNOSIS — J302 Other seasonal allergic rhinitis: Secondary | ICD-10-CM

## 2021-04-24 DIAGNOSIS — C50919 Malignant neoplasm of unspecified site of unspecified female breast: Secondary | ICD-10-CM

## 2021-04-24 DIAGNOSIS — M8589 Other specified disorders of bone density and structure, multiple sites: Secondary | ICD-10-CM

## 2021-04-24 NOTE — Progress Notes
Name: Alicia Sharp          MRN: [id number]      DOB: 1956-07-17      AGE: 65 y.o.   DATE OF SERVICE: 04/24/2021    Subjective:             Reason for Visit:  Heme/Onc Care      Cancer Staging  Malignant neoplasm of upper-outer quadrant of left breast in female, estrogen receptor positive (HCC)  Staging form: Breast, AJCC 8th Edition  - Clinical: Stage IA (cT1c, cN0, cM0, G2, ER+, PR+, HER2-) - Signed by Carylon Perches, APRN on 08/25/2018  - Pathologic: Stage IA (pT1c, pN0(sn), cM0, G2, ER+, PR+, HER2-) - Signed by Carylon Perches, APRN on 08/25/2018      Alicia Sharp is a 65 y.o. female who presented to the [facility] on 07/16/2018 at age 30 for evaluation of her left breast cancer. An abnormality was noted on screening mammogram. Left breast biopsy 07/02/18 revealed a grade 2, IDC at 12:00. S/p left lumpectomy/SLNB. She reports that she tolerated surgery generally well.  Completed adjuvant radiation.     Current Therapy:  Arimidex    History of Present Illness:      Tarshia returns today in routine follow up on Arimidex. She is doing good. She continues to take Arimidex and tolerating it well. She denies any breast area problems or concerns. She does not have any new medical conditions. She recalls that she was told that she should also have a sonogram performed due to breast density.    She reports her family is doing well.       Review of Systems   Constitutional: Negative for activity change, appetite change, chills, fatigue, fever and unexpected weight change.   HENT: Negative for congestion, dental problem, mouth sores, sore throat, tinnitus and trouble swallowing.    Eyes: Negative for visual disturbance.   Respiratory: Negative for cough, chest tightness, shortness of breath and wheezing.    Cardiovascular: Negative for chest pain, palpitations and leg swelling.   Gastrointestinal: Negative for abdominal distention, abdominal pain, blood in stool, constipation, diarrhea, nausea and vomiting. Endocrine: Negative for heat intolerance.   Genitourinary: Negative for difficulty urinating, dysuria, frequency and urgency.   Musculoskeletal: Negative for arthralgias, back pain and joint swelling.   Skin: Negative for rash.   Neurological: Negative for dizziness, weakness, numbness and headaches.   Hematological: Negative for adenopathy. Does not bruise/bleed easily.   Psychiatric/Behavioral: Negative for sleep disturbance. The patient is not nervous/anxious.          Medical History:   Diagnosis Date   ? Arthritis    ? Breast cancer (HCC) 07/2018    left breast   ? Hyperlipidemia    ? Seasonal allergies        Surgical History:   Procedure Laterality Date   ? LEFT RADIOACTIVE SEED LOCALIZED LUMPECTOMY Left 08/17/2018    Performed by Ignatius Specking, MD at [facility]   ? IDENTIFICATION SENTINEL LYMPH NODE Left 08/17/2018    Performed by Ignatius Specking, MD at [facility]   ? INJECTION RADIOACTIVE TRACER FOR SENTINEL NODE IDENTIFICATION Left 08/17/2018    Performed by Ignatius Specking, MD at [facility]   ? LEFT SENTINEL LYMPH NODE BIOPSY Left 08/17/2018    Performed by Ignatius Specking, MD at [facility]   ? COLONOSCOPY DIAGNOSTIC WITH SPECIMEN COLLECTION BY BRUSHING/ WASHING - FLEXIBLE N/A 04/07/2019    Performed by  Onnie Boer, MD at [facility] OR   ? HX ROTATOR CUFF REPAIR      R 08/2010. L 09/2015.   ? HX WISDOM TEETH EXTRACTION     ? KNEE ARTHROSCOPY Left     2014        Family History   Problem Relation Age of Onset   ? Migraines Mother    ? Heart Disease Father    ? Stroke Father    ? Coronary Artery Disease Father    ? Cancer-Breast Maternal Aunt         30s   ? Cancer-Prostate Maternal Grandfather    ? Migraines Maternal Grandfather         Social History     Socioeconomic History   ? Marital status: Widowed   ? Number of children: 2   Occupational History   ? Occupation: retired   Tobacco Use   ? Smoking status: Never Smoker   ? Smokeless tobacco: Never Used   ? Tobacco comment: briefly in college   Substance and Sexual Activity   ? Alcohol use: Yes     Alcohol/week: 5.0 standard drinks     Types: 5 Glasses of wine per week     Comment: 1 glass of wine/evening   ? Drug use: Never   Social History Narrative    Here with daughter Alicia Sharp          Objective:     No Known Allergies        ? anastrozole (ARIMIDEX) 1 mg tablet TAKE ONE TABLET BY MOUTH DAILY.   ? atorvastatin (LIPITOR) 40 mg tablet Take one-half tablet by mouth daily.   ? cholecalciferol (VITAMIN D-3) 1,000 units tablet    ? ferrous sulfate (FEOSOL) 325 mg (65 mg iron) tablet Take one tablet by mouth daily. Take on an empty stomach at least 1 hour before or 2 hours after food.   ? meloxicam (MOBIC) 15 mg tablet Take one tablet by mouth daily for 90 days.       Vitals:    04/24/21 1358 04/24/21 1359   BP: 120/68    BP Source: Arm, Right Upper    Pulse: 67    Temp: 37.1 ?C (98.8 ?F)    SpO2: 98%    TempSrc: Temporal    PainSc:  Zero   Weight: 79.2 kg (174 lb 9.6 oz)      Body mass index is 29.97 kg/m?Marland Kitchen     Pain Score: Zero       Fatigue Scale: 0-None    Pain Addressed:  N/A    Patient Evaluated for a Clinical Trial: No treatment clinical trial available for this patient.     [place] Cooperative Oncology Group performance status is 0, Fully active, able to carry on all pre-disease performance without restriction.Marland Kitchen     Physical Exam  Vitals reviewed.   Constitutional:       General: She is not in acute distress.     Appearance: Normal appearance. She is well-developed. She is not diaphoretic.   HENT:      Head: Normocephalic and atraumatic.      Mouth/Throat:      Mouth: Mucous membranes are moist. No oral lesions.      Pharynx: Oropharynx is clear.   Eyes:      General: No scleral icterus.     Conjunctiva/sclera: Conjunctivae normal.      Pupils: Pupils are equal, round, and reactive to light.   Pulmonary:  Effort: Pulmonary effort is normal. No respiratory distress.   Chest:   Breasts:      Right: No inverted nipple, mass, nipple discharge, skin change or tenderness. Left: No inverted nipple, mass, nipple discharge, skin change or tenderness.         Abdominal:      General: There is no distension.      Palpations: Abdomen is soft. There is no mass.      Tenderness: There is no abdominal tenderness. There is no guarding.   Musculoskeletal:         General: No swelling. Normal range of motion.      Cervical back: Normal range of motion and neck supple.   Lymphadenopathy:      Cervical: No cervical adenopathy.      Upper Body:      Right upper body: No supraclavicular or axillary adenopathy.      Left upper body: No supraclavicular or axillary adenopathy.   Skin:     General: Skin is warm and dry.      Coloration: Skin is not jaundiced or pale.      Findings: No rash.   Neurological:      Mental Status: She is alert and oriented to person, place, and time.      Cranial Nerves: No cranial nerve deficit.   Psychiatric:         Mood and Affect: Mood normal.         Behavior: Behavior normal.         Thought Content: Thought content normal.         Judgment: Judgment normal.            PATHOLOGY:  Final Diagnosis:     A. Breast, left radioactive seed lumpectomy, 1 clip, 1 seed, lumpectomy:   Invasive ductal carcinoma, histologic grade 2; margins negative.   See synoptic report below.     B. Breast, left additional superior margin, excision:   Negative for malignancy.     C. Breast, left additional lateral margin, excision:   Negative for malignancy. ?     D. Breast, left additional anterior margin, excision:   Negative for malignancy. ?     E. Adipose tissue, left additional posterior margin, excision:   Negative for malignancy. ?     F. Adipose tissue, left additional medial margin, excision:   Negative for malignancy. ?     G. Breast, left additional inferior margin, excision:   Negative for malignancy. ?     H. Lymph nodes (2), left axillary sentinel lymph nodes, dissection:   There is no evidence of malignancy in two lymph nodes. ?(0/2)   Deeper sections and a pancytokeratin immunostain are negative in   support of the above diagnosis. ?       Comment:   INVASIVE CARCINOMA OF THE BREAST: RESECTION   CAP Version: Invasive Breast 4.3.0.1   Procedure: ?Lumpectomy   Laterality: Left ?   Tumor Site: ?12:00, 3 cm FTN, per clinical note   Histologic Type: Invasive ductal carcinoma, with focal cribriform features   Size of Invasive Component: ?1.8 x 1.6 x 1.2 cm     Surgical Margins: ?   Lumpectomy   Main lumpectomy specimen: Margins negative   Invasive carcinoma: The closest margin is <1 mm from medial margin   and 1mm from the posterior margin. All remaining margins are greater than   2 mm.   DCIS: The closest margin is 6 mm from posterior margin.  All   remaining margins are greater than 2 mm.     Final shave margins:   All margins are negative for invasive and in-situ carcinoma.   ? ? ? ? ?   Histologic Grade (Nottingham Histologic Score): II / III   Tubule Formation: 3   Nuclear Grade: 2   Mitotic Count (40x objective): 1   Total Nottingham Score: ?6/9   Ductal Carcinoma In-situ (DCIS): ?Present, scant foci of DCIS, low grade   Extensive intraductal component (>25%): Absent   DCIS within and/or adjacent to invasive carcinoma: Yes   DCIS separate from invasive carcinoma: No ?   Lobular Carcinoma In-situ (LCIS): Absent   Lymph-Vascular Invasion: ?No   Nipple Involvement: Not Applicable   Skin Involvement: Not applicable   Skeletal Muscle: Not present   Lymph Node Sampling:   Sentinel lymph node(s) only   Total number of lymph nodes examined: 2   Number of lymph nodes with macrometastases (>2 mm): 0   Number of lymph nodes with micrometastases (>0.2 mm to 2 mm): 0   Number of lymph nodes with isolated tumor cells (<0.2 mm and   <200 cells): ?0   Size of largest metastatic deposit (millimeters): N/a   Extranodal extension: N/a   Prognostic markers: Ordered on block A11; the results will be issued as an   addendum.   Time between tumor removal and placement into formalin < 1 hour: ?Yes   Fixation Time between 6-72 hours: ?Yes     Pathologic Staging (pTNM, AJCC 8th edition): ?   pT1c ?snN0 ? Mn/a     Primary Tumor (Invasive Carcinoma) (pT)   pT1c: ? ? Tumor >10 mm but d20 mm in greatest dimension     Regional Lymph Nodes (pN)   (sn): ? ? Only sentinel node(s) evaluated. ?If 6 or more sentinel   nodes and/or nonsentinel nodes are removed, this modifier should not be   used.   pN0: ? ? No regional lymph node metastasis identified or ITCs only     Distant Metastasis (M) ?   Not applicable     The pathologic stage assigned here should be regarded as provisional, as   it reflects only current pathologic data and does not incorporate full   knowledge of the patient's clinical status and/or prior pathology.     Block for Biomarker Testing: A11       Recent Results (from the past 672 hour(s))   HAND MIN 3 VIEWS RIGHT    Collection Time: 04/06/21 10:58 AM    Narrative    Exam: HAND MIN 3 VIEWS RIGHT    CLINICAL INDICATION: 64 years injury to right middle finger , pain of right middle finger    COMPARISON: None.      Impression    1.  No fracture or acute osseous abnormality of the third finger. Mild soft tissue swelling of the proximal third finger.    2.  Minimal osteoarthritic change greatest at the fifth PIP and DIP joints.       Finalized by Shanna Cisco, M.D. on 04/06/2021 11:28 AM. Dictated by Shanna Cisco, M.D. on 04/06/2021 11:27 AM.     CBC    Collection Time: 04/06/21 11:15 AM   Result Value Ref Range    White Blood Cells 4.8 4.5 - 11.0 K/UL    RBC 3.65 (L) 4.0 - 5.0 M/UL    Hemoglobin 11.6 (L) 12.0 - 15.0 GM/DL    Hematocrit 16.1 (L) 36 - 45 %  MCV 95.0 80 - 100 FL    MCH 31.7 26 - 34 PG    MCHC 33.4 32.0 - 36.0 G/DL    RDW 47.8 11 - 15 %    Platelet Count 251 150 - 400 K/UL    MPV 7.6 7 - 11 FL   UA REFLEX LABEL    Collection Time: 04/06/21 11:15 AM    Specimen: Urine   Result Value Ref Range    UA Reflex Culture       Criteria for reflex to culture are WBC>10, Positive Nitrite, and/or >=+1 leukocytes. If quantity is not sufficient, an addendum will follow.     URINALYSIS MICROSCOPIC REFLEX TO CULTURE    Collection Time: 04/06/21 11:15 AM    Specimen: Urine   Result Value Ref Range    WBCs,UA 2-10 0 - 2 /HPF    RBCs,UA 0-2 0 - 3 /HPF    Comment,UA       Criteria for reflex to culture are WBC>10, Positive Nitrite, and/or >=+1   leukocytes. If quantity is not sufficient, an addendum will follow.      MucousUA TRACE     Squamous Epithelial Cells 0-2 0 - 5   URINALYSIS DIPSTICK REFLEX TO CULTURE    Collection Time: 04/06/21 11:15 AM    Specimen: Urine   Result Value Ref Range    Color,UA YELLOW     Turbidity,UA CLEAR CLEAR-CLEAR    Specific Gravity-Urine 1.013 1.005 - 1.030    pH,UA 7.0 5.0 - 8.0    Protein,UA NEG NEG-NEG    Glucose,UA NEG NEG-NEG    Ketones,UA NEG NEG-NEG    Bilirubin,UA NEG NEG-NEG    Blood,UA NEG NEG-NEG    Urobilinogen,UA NORMAL NORM-NORMAL    Nitrite,UA NEG NEG-NEG    Leukocytes,UA 1+ (A) NEG-NEG    Urine Ascorbic Acid, UA NEG NEG-NEG   CULTURE-URINE W/SENSITIVITY    Collection Time: 04/06/21 11:15 AM    Specimen: Midstream; Urine   Result Value Ref Range    Battery Name URINE CULTURE     Report Status FINAL 04/07/2021     Specimen Description URINE MIDSTREAM       Special Requests No special requests     Culture       <100,000 CFU/ml  Urogenital and/or skin microbiota     POC CREATININE, RAD    Collection Time: 04/09/21  3:11 PM   Result Value Ref Range    Creatinine, POC 1.0 0.4 - 1.00 MG/DL   CT ABD/PELV W CONTRAST    Collection Time: 04/09/21  3:17 PM    Narrative    CT ABDOMEN AND PELVIS    Clinical Indication:  Breast cancer with left lower quadrant abdominal pain.    Technique:  Multiple contiguous axial images were obtained through the abdomen and pelvis following the administration of IV contrast material. Portal venous phase of postcontrast imaging was obtained. Post processing coronal and sagittal reconstruction images were made from the axial images.     IV contrast: Yes  Bowel contrast:  None    Comparison: None.    FINDINGS:    Lower Thorax: Unremarkable.    Liver and Biliary system: Normal sized liver. Left hepatic cyst. The major portal veins are patent. No calcified gallstones.     Spleen: Unremarkable.    Adrenal Glands and Kidneys: Unremarkable adrenal glands. No hydronephrosis.    Pancreas and Retroperitoneum: Unremarkable pancreas. No retroperitoneal lymphadenopathy.    Aorta and Major Vessels: Normal caliber abdominal aorta  and iliac arteries with mild calcific atherosclerosis.     Bowel, Mesentery and Peritoneal space: The small and large bowel loops are normal in caliber. Normal appendix. No ascites or pneumoperitoneum.    Pelvis: The bladder is mildly distended. The uterus is present. No pelvic lymphadenopathy.    Abdominal wall and Osseous Structures: Mild lumbar spondylosis.      Impression    No bowel obstruction, abdominopelvic lymphadenopathy, inflammatory mass, or ascites. Normal appendix.       Finalized by Jake Bathe, M.D. on 04/09/2021 3:45 PM. Dictated by Jake Bathe, M.D. on 04/09/2021 3:31 PM.        09/04/2018: BONE DENSITY:    IMPRESSION  Normal bone mineral density analysis.         Assessment and Plan:  The patient is a 65 y.o. female with the following:    1.  Left grade 2, IDC (ER91-100%, PR91-100%, Her2 0+), at 12:00, dx 06/2018.  Clinical Stage IA; cT2 N0 M0.  She is now s/p lumpectomy and SLNB; pT1c N0 M0.   2. Decreased mental acuity   3. Osteopenia-mild.        Mabel is without evidence of disease at this time.    Continue Arimidex.    Last DEXA reviewed and showed mild osteopenia of negative 1.3.    Continue calcium, vitamin D, and weight bearing exercise.    Next due 08/31/2021.    Mammogram ultrasound due 07/01/2021.    Continue a healthy lifestyle and regular exercise.    She was encouraged to call with any persistent symptoms, questions, or concerns.    Return to clinic in 6 months.    Dr. Dellia Cloud is the collaborating physician.    Elias Else, APRN-NP                                          ATTESTATION  This visit was documented by Josefine Class via audio recorded by Jacqualin Combes, APRN on April 24, 2021 at 2:26 PM.

## 2021-06-07 ENCOUNTER — Encounter: Admit: 2021-06-07 | Discharge: 2021-06-07 | Payer: MEDICARE

## 2021-06-07 DIAGNOSIS — M25551 Pain in right hip: Secondary | ICD-10-CM

## 2021-06-11 ENCOUNTER — Encounter: Admit: 2021-06-11 | Discharge: 2021-06-11 | Payer: MEDICARE

## 2021-06-11 DIAGNOSIS — M1990 Unspecified osteoarthritis, unspecified site: Secondary | ICD-10-CM

## 2021-06-11 MED ORDER — MELOXICAM 15 MG PO TAB
15 mg | ORAL_TABLET | Freq: Every day | ORAL | 0 refills
Start: 2021-06-11 — End: ?

## 2021-06-11 NOTE — Telephone Encounter
Some refill protocol elements NOT Met  Medication name: Mobic  Medication Strength: 72m tablet      Off protocol   This refill cannot be delegated    Routed to Provider     LTruitt Leep RN

## 2021-06-17 ENCOUNTER — Ambulatory Visit: Admit: 2021-06-17 | Discharge: 2021-06-17 | Payer: MEDICARE

## 2021-06-17 ENCOUNTER — Encounter: Admit: 2021-06-17 | Discharge: 2021-06-17 | Payer: MEDICARE

## 2021-06-17 DIAGNOSIS — M1990 Unspecified osteoarthritis, unspecified site: Secondary | ICD-10-CM

## 2021-06-17 DIAGNOSIS — M542 Cervicalgia: Secondary | ICD-10-CM

## 2021-06-17 DIAGNOSIS — C50919 Malignant neoplasm of unspecified site of unspecified female breast: Secondary | ICD-10-CM

## 2021-06-17 DIAGNOSIS — S134XXA Sprain of ligaments of cervical spine, initial encounter: Secondary | ICD-10-CM

## 2021-06-17 DIAGNOSIS — M545 Acute bilateral low back pain without sciatica: Secondary | ICD-10-CM

## 2021-06-17 DIAGNOSIS — E785 Hyperlipidemia, unspecified: Secondary | ICD-10-CM

## 2021-06-17 DIAGNOSIS — J302 Other seasonal allergic rhinitis: Secondary | ICD-10-CM

## 2021-06-17 MED ORDER — CYCLOBENZAPRINE 5 MG PO TAB
5 mg | ORAL_TABLET | Freq: Three times a day (TID) | ORAL | 1 refills | 30.00000 days | Status: AC
Start: 2021-06-17 — End: ?
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
## 2021-06-22 ENCOUNTER — Encounter: Admit: 2021-06-22 | Discharge: 2021-06-22 | Payer: MEDICARE

## 2021-06-24 ENCOUNTER — Encounter: Admit: 2021-06-24 | Discharge: 2021-06-24 | Payer: MEDICARE

## 2021-07-02 ENCOUNTER — Ambulatory Visit: Admit: 2021-07-02 | Discharge: 2021-07-02 | Payer: MEDICARE

## 2021-07-02 ENCOUNTER — Encounter: Admit: 2021-07-02 | Discharge: 2021-07-02 | Payer: MEDICARE

## 2021-07-02 DIAGNOSIS — M1990 Unspecified osteoarthritis, unspecified site: Secondary | ICD-10-CM

## 2021-07-02 DIAGNOSIS — C50919 Malignant neoplasm of unspecified site of unspecified female breast: Secondary | ICD-10-CM

## 2021-07-02 DIAGNOSIS — D649 Anemia, unspecified: Secondary | ICD-10-CM

## 2021-07-02 DIAGNOSIS — E785 Hyperlipidemia, unspecified: Secondary | ICD-10-CM

## 2021-07-02 DIAGNOSIS — Z23 Encounter for immunization: Secondary | ICD-10-CM

## 2021-07-02 DIAGNOSIS — D509 Iron deficiency anemia, unspecified: Secondary | ICD-10-CM

## 2021-07-02 DIAGNOSIS — E7800 Pure hypercholesterolemia, unspecified: Secondary | ICD-10-CM

## 2021-07-02 DIAGNOSIS — J302 Other seasonal allergic rhinitis: Secondary | ICD-10-CM

## 2021-07-02 DIAGNOSIS — Z0000 Encounter for general adult medical examination without abnormal findings: Secondary | ICD-10-CM

## 2021-07-02 MED ORDER — FERROUS SULFATE 325 MG (65 MG IRON) PO TAB
325 mg | ORAL_TABLET | ORAL | 1 refills | Status: AC
Start: 2021-07-02 — End: ?

## 2021-07-11 ENCOUNTER — Ambulatory Visit: Admit: 2021-07-11 | Discharge: 2021-07-11 | Payer: MEDICARE

## 2021-07-11 ENCOUNTER — Encounter: Admit: 2021-07-11 | Discharge: 2021-07-11 | Payer: MEDICARE

## 2021-07-11 DIAGNOSIS — C50412 Malignant neoplasm of upper-outer quadrant of left female breast: Secondary | ICD-10-CM

## 2021-07-11 DIAGNOSIS — Z170 Estrogen receptor positive status [ER+]: Secondary | ICD-10-CM

## 2021-08-31 ENCOUNTER — Encounter: Admit: 2021-08-31 | Discharge: 2021-08-31 | Payer: MEDICARE

## 2021-08-31 DIAGNOSIS — C50412 Malignant neoplasm of upper-outer quadrant of left female breast: Secondary | ICD-10-CM

## 2021-08-31 DIAGNOSIS — Z9889 Other specified postprocedural states: Secondary | ICD-10-CM

## 2021-08-31 DIAGNOSIS — N644 Mastodynia: Secondary | ICD-10-CM

## 2021-09-03 ENCOUNTER — Encounter: Admit: 2021-09-03 | Discharge: 2021-09-03 | Payer: MEDICARE

## 2021-09-03 DIAGNOSIS — N644 Mastodynia: Secondary | ICD-10-CM

## 2021-09-03 DIAGNOSIS — Z9889 Other specified postprocedural states: Secondary | ICD-10-CM

## 2021-09-03 DIAGNOSIS — C50412 Malignant neoplasm of upper-outer quadrant of left female breast: Secondary | ICD-10-CM

## 2021-09-07 ENCOUNTER — Ambulatory Visit: Admit: 2021-09-07 | Discharge: 2021-09-07 | Payer: MEDICARE

## 2021-09-07 ENCOUNTER — Encounter: Admit: 2021-09-07 | Discharge: 2021-09-07 | Payer: MEDICARE

## 2021-09-07 DIAGNOSIS — M25551 Pain in right hip: Secondary | ICD-10-CM

## 2021-09-07 DIAGNOSIS — C50919 Malignant neoplasm of unspecified site of unspecified female breast: Secondary | ICD-10-CM

## 2021-09-07 DIAGNOSIS — M1990 Unspecified osteoarthritis, unspecified site: Secondary | ICD-10-CM

## 2021-09-07 DIAGNOSIS — J302 Other seasonal allergic rhinitis: Secondary | ICD-10-CM

## 2021-09-07 DIAGNOSIS — C50412 Malignant neoplasm of upper-outer quadrant of left female breast: Secondary | ICD-10-CM

## 2021-09-07 DIAGNOSIS — E785 Hyperlipidemia, unspecified: Secondary | ICD-10-CM

## 2021-09-07 DIAGNOSIS — Z79811 Long term (current) use of aromatase inhibitors: Secondary | ICD-10-CM

## 2021-09-07 DIAGNOSIS — M545 Chronic midline low back pain, unspecified whether sciatica present: Secondary | ICD-10-CM

## 2021-09-25 ENCOUNTER — Encounter: Admit: 2021-09-25 | Discharge: 2021-09-25 | Payer: MEDICARE

## 2021-10-04 ENCOUNTER — Encounter: Admit: 2021-10-04 | Discharge: 2021-10-04 | Payer: MEDICARE

## 2021-10-11 ENCOUNTER — Encounter: Admit: 2021-10-11 | Discharge: 2021-10-11 | Payer: MEDICARE

## 2021-10-18 ENCOUNTER — Encounter: Admit: 2021-10-18 | Discharge: 2021-10-18 | Payer: MEDICARE

## 2021-10-31 ENCOUNTER — Encounter: Admit: 2021-10-31 | Discharge: 2021-10-31 | Payer: MEDICARE

## 2021-10-31 ENCOUNTER — Ambulatory Visit: Admit: 2021-10-31 | Discharge: 2021-11-01 | Payer: MEDICARE

## 2021-10-31 VITALS — BP 143/72 | HR 67 | Temp 98.80000°F | Resp 16 | Ht 64.0 in | Wt 174.0 lb

## 2021-10-31 DIAGNOSIS — E785 Hyperlipidemia, unspecified: Secondary | ICD-10-CM

## 2021-10-31 DIAGNOSIS — J302 Other seasonal allergic rhinitis: Secondary | ICD-10-CM

## 2021-10-31 DIAGNOSIS — M1990 Unspecified osteoarthritis, unspecified site: Secondary | ICD-10-CM

## 2021-10-31 DIAGNOSIS — C50919 Malignant neoplasm of unspecified site of unspecified female breast: Secondary | ICD-10-CM

## 2021-10-31 NOTE — Progress Notes
Date of Service: 10/31/2021  MRN#: [id number]  DOB: 11/15/1955  PCP: Dimitri Ped    Subjective:   Chief Complaint   Patient presents with   ? Paperwork     Paperwork for sports team, needs to be cleared.      Alicia Sharp is a 66 y.o.female patient who presents for paperwork.     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  Overall she feels like she is in good health.  She has been taking a calcium and vitamin D supplement for osteopenia.  She is taking iron every other day.  She does feel like she is having fluctuations in her weight after menopause and due to medication side effects.  She does not follow a special diet, but does typically eat solid for lunch and a protein for dinner.  She has used Noom in the past.  She is physically active and participates in track and boat activities 2-3 times per week, exercises with a trainer once a week, does light weights 3 times per week, and walks frequently.  She drinks approximately 2 glasses of wine per night.  She denies tobacco or illicit drug use.  She follows with Oncology on 11/05/2021.  She does not have any concerns today.    Medical History:   Diagnosis Date   ? Arthritis    ? Breast cancer (HCC) 07/2018    left breast   ? Hyperlipidemia    ? Seasonal allergies        Review of Systems   Constitutional: Negative for fatigue, fever and unexpected weight change.   HENT: Negative for dental problem, hearing loss and trouble swallowing.    Eyes: Negative.    Respiratory: Negative for cough, shortness of breath and wheezing.    Cardiovascular: Negative for chest pain, palpitations and leg swelling.   Gastrointestinal: Negative for abdominal pain, blood in stool, constipation and diarrhea.   Endocrine: Negative.    Genitourinary: Negative for difficulty urinating, dysuria, vaginal bleeding and vaginal pain.   Musculoskeletal: Positive for arthralgias (knee and hip pain, following with Ortho and PT). Negative for joint swelling and myalgias.   Skin: Negative for rash and wound.   Neurological: Negative for dizziness, syncope, numbness and headaches.   Psychiatric/Behavioral: Negative for dysphoric mood and suicidal ideas. The patient is not nervous/anxious.         Objective:     Medication:  ? anastrozole (ARIMIDEX) 1 mg tablet TAKE ONE TABLET BY MOUTH DAILY.   ? atorvastatin (LIPITOR) 40 mg tablet Take one-half tablet by mouth daily.   ? calcium carbonate (OS-CAL) 500 mg calcium (1,250 mg) chewable tablet    ? calcium carbonate (TUMS PO) Take  by mouth. Twice a day   ? cholecalciferol (VITAMIN D-3) 1,000 units tablet    ? ferrous sulfate (FEOSOL) 325 mg (65 mg iron) tablet Take one tablet by mouth every 48 hours. Take on an empty stomach at least 1 hour before or 2 hours after food.   ? meloxicam (MOBIC) 15 mg tablet Take one tablet by mouth daily.       Vitals:    10/31/21 1058   PainSc: Zero   Weight: 78.9 kg (174 lb)   Height: 162.6 cm (5' 4)       Body mass index is 29.87 kg/m?Marland Kitchen    Physical Exam  Constitutional:       General: She is not in acute distress.  Appearance: Normal appearance. She is not ill-appearing.   HENT:      Head: Normocephalic and atraumatic.   Cardiovascular:      Rate and Rhythm: Normal rate and regular rhythm.      Pulses: Normal pulses.      Heart sounds: Normal heart sounds.   Pulmonary:      Effort: Pulmonary effort is normal. No respiratory distress.      Breath sounds: Normal breath sounds.   Abdominal:      General: Abdomen is flat. Bowel sounds are normal. There is no distension.      Palpations: Abdomen is soft.      Tenderness: There is no abdominal tenderness. There is no guarding.   Musculoskeletal:         General: Normal range of motion.      Cervical back: Normal range of motion.   Skin:     General: Skin is warm and dry.   Neurological:      General: No focal deficit present.      Mental Status: She is alert. Mental status is at baseline.   Psychiatric:         Mood and Affect: Mood normal.          Assessment and Plan:    Health care maintenance  - Patient cleared for dragon boat racing today  - Lab work within the last year     Malignant neoplasm of upper-outer quadrant of left breast in female, estrogen receptor positive (HCC)  - Stable, on Arimidex  - Follows with Oncology on 11/05/21    RTC 8/23 for AWV       Total of 30 minutes were spent on the same day of the visit including preparing to see the patient, obtaining and/or reviewing separately obtained history, performing a medically appropriate examination and/or evaluation, counseling and educating the patient/family/caregiver, ordering medications, tests, or procedures, referring and communication with other health care professionals, documenting clinical information in the electronic or other health record, independently interpreting results and communicating results to the patient/family/caregiver, and care coordination. I used a dictation program when conducting this note. Due to this, there may be grammatical errors or inconsistencies. For clarification or questions, please contact me.   Steward Ros, APRN-NP    Future Appointments   Date Time Provider Department Center   11/01/2021  3:10 PM Rolley Sims [place], [place] [id number]   11/05/2021  3:00 PM O'Dea, Daneil Dan, MD [facility] [place] [facility]     Patient Instructions     It was nice to see you today! Thank you for coming into our clinic.    Please notify the clinic or go to the emergency room if you develop sudden worsening of symptoms, fevers, chest pain, difficulty breathing, or other concerning symptoms.     Please call my nurse if you have any further questions or concerns, at [phone]. You can leave a voicemail if needed. You can also reach [place] by sending a secure message through MyChart.    Helpful Information:  ? We will contact you by MyChart or phone with any test results when they are available.   ? If you are expecting results and have not heard from [place] within 2 weeks of your testing, please send a MyChart message or call the clinic.  ? Please use the MyChart Refill request or contact your pharmacy directly to request medication refills. - Please allow at least 72 hours for refill requests.  ? For appointments:  Our Scheduling phone number is [phone].   ? If you were expecting a phone call to schedule a referral and haven't heard from the referring office within two weeks, please let us know.    Take care,  Ailene Ravel, DNP, APRN, FNP-BC      Future Appointments   Date Time Provider Department Center   11/01/2021  3:10 PM Rolley Sims [place], [place] [id number]   11/05/2021  3:00 PM O'Dea, Daneil Dan, MD [facility] [place] [facility]

## 2021-10-31 NOTE — Patient Instructions
It was nice to see you today! Thank you for coming into our clinic.    Please notify the clinic or go to the emergency room if you develop sudden worsening of symptoms, fevers, chest pain, difficulty breathing, or other concerning symptoms.     Please call my nurse if you have any further questions or concerns, at [phone]. You can leave a voicemail if needed. You can also reach [place] by sending a secure message through [facility].    Helpful Information:  We will contact you by MyChart or phone with any test results when they are available.   If you are expecting results and have not heard from [place] within 2 weeks of your testing, please send a MyChart message or call the clinic.  Please use the MyChart Refill request or contact your pharmacy directly to request medication refills. - Please allow at least 72 hours for refill requests.  For appointments: Our Scheduling phone number is [phone].   If you were expecting a phone call to schedule a referral and haven't heard from the referring office within two weeks, please let us know.    Take care,  Rodman Pickle, DNP, APRN, FNP-BC      Future Appointments   Date Time Provider [place]   11/01/2021  3:10 PM Sallye Lat [place], [place] [id number]   11/05/2021  3:00 PM O'Dea, Ludger Nutting, MD [facility] [place] [facility]

## 2021-11-01 DIAGNOSIS — Z170 Estrogen receptor positive status [ER+]: Secondary | ICD-10-CM

## 2021-11-01 DIAGNOSIS — Z0000 Encounter for general adult medical examination without abnormal findings: Principal | ICD-10-CM

## 2021-11-01 DIAGNOSIS — C50412 Malignant neoplasm of upper-outer quadrant of left female breast: Secondary | ICD-10-CM

## 2021-11-01 NOTE — Patient Instructions
Thank you for coming to see us today.   Please call our office or send a message through MyChart if you have any questions or concerns.          Dr. Anne O'Dea and Michelle Prager, APRN  [phone] (Nurses: Michele Kuester/Bailee Deviney/Prezley Qadir)  [phone] (fax)    [facility] Location  [address]  [place], [place] [address]    [facility]  [address]  [place], [place] [address]

## 2021-11-05 ENCOUNTER — Encounter: Admit: 2021-11-05 | Discharge: 2021-11-05 | Payer: MEDICARE

## 2021-11-05 DIAGNOSIS — M1990 Unspecified osteoarthritis, unspecified site: Secondary | ICD-10-CM

## 2021-11-05 DIAGNOSIS — Z1231 Encounter for screening mammogram for malignant neoplasm of breast: Secondary | ICD-10-CM

## 2021-11-05 DIAGNOSIS — E785 Hyperlipidemia, unspecified: Secondary | ICD-10-CM

## 2021-11-05 DIAGNOSIS — C50919 Malignant neoplasm of unspecified site of unspecified female breast: Secondary | ICD-10-CM

## 2021-11-05 DIAGNOSIS — J302 Other seasonal allergic rhinitis: Secondary | ICD-10-CM

## 2021-11-05 DIAGNOSIS — C50412 Malignant neoplasm of upper-outer quadrant of left female breast: Secondary | ICD-10-CM

## 2021-12-05 ENCOUNTER — Encounter: Admit: 2021-12-05 | Discharge: 2021-12-05 | Payer: MEDICARE

## 2021-12-05 MED ORDER — ANASTROZOLE 1 MG PO TAB
ORAL_TABLET | ORAL | 0 refills | 33.00000 days | Status: AC
Start: 2021-12-05 — End: ?

## 2021-12-06 ENCOUNTER — Encounter: Admit: 2021-12-06 | Discharge: 2021-12-06 | Payer: MEDICARE

## 2021-12-06 NOTE — Telephone Encounter
Add flonase to claritin

## 2021-12-07 ENCOUNTER — Encounter: Admit: 2021-12-07 | Discharge: 2021-12-07 | Payer: MEDICARE

## 2021-12-07 DIAGNOSIS — E7800 Pure hypercholesterolemia, unspecified: Secondary | ICD-10-CM

## 2021-12-07 DIAGNOSIS — Z0000 Encounter for general adult medical examination without abnormal findings: Secondary | ICD-10-CM

## 2021-12-07 MED ORDER — ATORVASTATIN 40 MG PO TAB
20 mg | ORAL_TABLET | Freq: Every day | ORAL | 3 refills
Start: 2021-12-07 — End: ?

## 2021-12-07 NOTE — Telephone Encounter
Refill requested for atorvastatin 40 mg tab by pharmacy.      Last Rx written:   Disp Refills Start End    atorvastatin (LIPITOR) 40 mg tablet 45 tablet 3 12/20/2020     Sig - Route: Take one-half tablet by mouth daily. - Oral    Sent to pharmacy as: atorvastatin 40 mg tablet (LIPITOR)    E-Prescribing Status: Receipt confirmed by pharmacy (12/20/2020 10:32 AM CDT)              Next Appt due:  Return in 1 year 07/02/2022        Future Appointments   Date Time Provider [place]   07/08/2022  1:15 PM [facility] - IC ROOM 1 [id number]   07/08/2022  2:00 PM [facility] - IC BREAST ROOM 2 [id number]   07/08/2022  3:15 PM O'Dea, Ludger Nutting, MD [facility] [place] [facility]           All protocol criteria met? Yes            Routed to Provider

## 2021-12-11 ENCOUNTER — Encounter: Admit: 2021-12-11 | Discharge: 2021-12-11 | Payer: MEDICARE

## 2021-12-11 DIAGNOSIS — Z0000 Encounter for general adult medical examination without abnormal findings: Secondary | ICD-10-CM

## 2021-12-11 DIAGNOSIS — E7800 Pure hypercholesterolemia, unspecified: Secondary | ICD-10-CM

## 2021-12-11 MED ORDER — ATORVASTATIN 40 MG PO TAB
ORAL_TABLET | 3 refills
Start: 2021-12-11 — End: ?

## 2021-12-12 ENCOUNTER — Encounter: Admit: 2021-12-12 | Discharge: 2021-12-12 | Payer: MEDICARE

## 2021-12-12 MED ORDER — ATORVASTATIN 20 MG PO TAB
20 mg | ORAL_TABLET | Freq: Every day | ORAL | 3 refills | Status: AC
Start: 2021-12-12 — End: ?

## 2022-02-07 ENCOUNTER — Encounter: Admit: 2022-02-07 | Discharge: 2022-02-07 | Payer: MEDICARE

## 2022-02-08 ENCOUNTER — Ambulatory Visit: Admit: 2022-02-08 | Discharge: 2022-02-09 | Payer: MEDICARE

## 2022-02-08 ENCOUNTER — Encounter: Admit: 2022-02-08 | Discharge: 2022-02-08 | Payer: MEDICARE

## 2022-02-08 DIAGNOSIS — M1990 Unspecified osteoarthritis, unspecified site: Secondary | ICD-10-CM

## 2022-02-08 DIAGNOSIS — E785 Hyperlipidemia, unspecified: Secondary | ICD-10-CM

## 2022-02-08 DIAGNOSIS — J302 Other seasonal allergic rhinitis: Secondary | ICD-10-CM

## 2022-02-08 DIAGNOSIS — C50919 Malignant neoplasm of unspecified site of unspecified female breast: Secondary | ICD-10-CM

## 2022-02-08 MED ORDER — PAXLOVID 300 MG (150 MG X 2)-100 MG PO DSPK
3 | ORAL_TABLET | Freq: Two times a day (BID) | ORAL | 0 refills | Status: DC
Start: 2022-02-08 — End: 2022-02-08

## 2022-02-08 NOTE — Progress Notes
Subjective:       Patient Reported Other  What topic(s) would you like to cover during your appointment?:  Positive Covid test  Please describe the issue(s) and history with the issue (location, severity, duration, symptoms, etc.).:  Sore throat, runny noise, headache, fatigue  What has been done so far to take care of the issue(s)?:  Sleep  What are your goals for this visit?:  Relief of symptoms    Alicia Sharp is a 66 y.o. female who is seen via telehealth visit today to address acute COVID infection. She has medical history of breast cancer, obesity, osteopenia, CAD, pulmonary nodules, and normocytic anemia. She is a patient of Dr. Tacey Ruiz Dionisi.     Alicia Sharp reports on Wednesday night (~2 days ago) she developed a sore throat.  Since that time she has developed a runny nose, watery eyes, sneezing, cough and ongoing sore throat.  She reports she tested at home for COVID yesterday morning and test was negative.  However, her symptoms progressed and she also developed a mild headache so she tested again in the afternoon and her test was positive for COVID.  She reports she was recently traveling with a group and in retrospect 1 person had similar symptoms to what she has developed, but she is unsure if that person had tested for COVID.  She has received prior immunization for COVID.  She currently denies any shortness of breath, fever and lightheadedness.    She has been isolating at home. She reports her grandchildren won't be able to visit as planned to celebrate the 4th of July.        Review of Systems   HENT: Positive for rhinorrhea, sneezing and sore throat.    Eyes: Positive for discharge.   Respiratory: Positive for cough. Negative for shortness of breath.    Neurological: Positive for headaches. Negative for light-headedness.       Medical History:   Diagnosis Date   ? Arthritis    ? Breast cancer (HCC) 07/2018    left breast   ? Hyperlipidemia    ? Seasonal allergies      Surgical History:   Procedure Laterality Date   ? LEFT RADIOACTIVE SEED LOCALIZED LUMPECTOMY Left 08/17/2018    Performed by Ignatius Specking, MD at [facility]   ? IDENTIFICATION SENTINEL LYMPH NODE Left 08/17/2018    Performed by Ignatius Specking, MD at [facility]   ? INJECTION RADIOACTIVE TRACER FOR SENTINEL NODE IDENTIFICATION Left 08/17/2018    Performed by Ign[facility]ius Spe[facility]ing, MD at [facility]   ? LEFT SENTINEL LYMPH NODE BIOPSY Left 08/17/2018    Performed by [facility]ius Spe[facility]ing, [facility] at [facility]   ? COLONOSCOPY DIAGNOSTIC WITH SPECIMEN COLLECTION BY BRUSHING/ WASHING - FLEXIBLE N/A 04/07/2019    Performed by Onnie Boer, [facility] at [facility]   ? HX ROTATOR CUFF REPAIR      R 08/2010. L 09/2015.   ? HX WISDOM TEETH EXTRACTION     ? KNEE ARTHROSCOPY Left     2014     Family History   Problem Relation Age of Onset   ? Migraines Mother    ? Heart Disease Father    ? Stroke Father    ? Coronary Artery Disease Father    ? Cancer-Breast Maternal Aunt         30s   ? Cancer-Prostate Maternal Grandfather    ? Migraines Maternal Grandfather  Social History     Socioeconomic History   ? Marital status: Widowed   ? Number of children: 2   Occupational History   ? Occupation: retired   Tobacco Use   ? Smoking status: Never   ? Smokeless tobacco: Never   ? Tobacco comments:     briefly in college   Substance and Sexual Activity   ? Alcohol use: Yes     Alcohol/week: 5.0 standard drinks of alcohol     Types: 5 Glasses of wine per week     Comment: 1 glass of wine/evening   ? Drug use: Never   Social History Narrative    Here with daughter Denny Peon                   Depression Screening:  Patient Scores:  : PHQ-2 Score: 0 (10/31/2021 11:06 AM)    Patient Health Questionnaire (PHQ)-9: No data recorded  Interventions:  Patient Health Questionnaire (PHQ)-2: PHQ-2 Score less than 3: No follow-up or recommendations are necessary at this time (10/31/2021 11:06 AM)    Depression Interventions Patient Health Questionnaire (PHQ)-2/9: No data recorded        Objective:         ? anastrozole (ARIMIDEX) 1 mg tablet TAKE ONE TABLET BY MOUTH ONCE DAILY   ? atorvastatin (LIPITOR) 20 mg tablet Take one tablet by mouth daily.   ? calcium carbonate (TUMS PO) Take  by mouth. Twice a day   ? cholecalciferol (VITAMIN D-3) 1,000 units tablet    ? ferrous sulfate (FEOSOL) 325 mg (65 mg iron) tablet Take one tablet by mouth every 48 hours. Take on an empty stomach at least 1 hour before or 2 hours after food.   ? loratadine (CLARITIN) 10 mg tablet daily.   ? meloxicam (MOBIC) 15 mg tablet Take one tablet by mouth daily.   ? nirmatrelvir-ritonavir (PAXLOVID) 300 mg (150 mg x 2)-100 mg tablets in a dose pack Take three tablets by mouth twice daily. Administer all tablets together orally twice daily with or without food for 5 days; initiate as soon as possible after COVID-19 diagnosis and within 5 days of symptom onset.     There were no vitals filed for this visit.  There is no height or weight on file to calculate BMI.     Physical Exam  Nursing note reviewed.   Constitutional:       General: She is not in acute distress.     Appearance: She is obese. She is not ill-appearing.   HENT:      Head: Normocephalic and atraumatic.      Right Ear: External ear normal.      Left Ear: External ear normal.      Nose: Nose normal.      Mouth/Throat:      Comments: Voice is hoarse  Pulmonary:      Effort: Pulmonary effort is normal.      Breath sounds: Normal breath sounds.   Skin:     Coloration: Skin is not jaundiced.   Neurological:      Mental Status: She is alert and oriented to person, place, and time.   Psychiatric:         Mood and Affect: Mood normal.         Behavior: Behavior normal.            Assessment and Plan:  [name] was seen today for follow up.    Diagnoses and  all orders for this visit:    Acute COVID-19  We discussed Paxlovid which is indicated given her age, and she is interested in treatment with this.  We discussed that she should hold her atorvastatin while taking Paxlovid.  Review of prior lab work (notably, which is greater than 6 months ago) reveals no issues with renal function or prior liver enzyme elevation.  She was counseled on side effects to monitor for and also advised on return precautions if symptoms worsen. Lastly, she was counseled on isolation precautions.   -     nirmatrelvir-ritonavir (PAXLOVID) 300 mg (150 mg x 2)-100 mg tablets in a dose pack; Take three tablets by mouth twice daily. Administer all tablets together orally twice daily with or without food for 5 days; initiate as soon as possible after COVID-19 diagnosis and within 5 days of symptom onset.        Above medical problems reviewed and discussed in detail.     Total Time Today was 25 minutes in the following activities: Preparing to see the patient, Obtaining and/or reviewing separately obtained history, Performing a medically appropriate examination and/or evaluation, Counseling and educating the patient/family/caregiver, Ordering medications, tests, or procedures and Documenting clinical information in the electronic or other health record    Lorrene Reid, MD      Heath Maintenance / Prevention    Health Maintenance   Topic Date Due   ? ADVANCED CARE PLANNING DISCUSSION AND DOCUMENTATION  08/12/2021   ? PNEUMOCOCCAL VACCINE 65+ YRS (2 - PCV) 10/23/2021   ? PHYSICAL (COMPREHENSIVE) EXAM  03/19/2022   ? MEDICARE ANNUAL WELLNESS VISIT  03/19/2022   ? BREAST CANCER SCREENING  07/11/2022   ? DTAP/TDAP VACCINES (2 - Td or Tdap) 09/07/2025   ? COLORECTAL CANCER SCREENING  04/06/2029   ? OSTEOPOROSIS SCREENING/MONITORING  Completed   ? COVID-19 VACCINE  Completed   ? SHINGLES RECOMBINANT VACCINE  Completed   ? HEPATITIS C SCREENING  Completed   ? DEPRESSION SCREENING  Completed   ? INFLUENZA VACCINE  Completed   ? CERVICAL CANCER SCREENING  Discontinued         Immunization History   Administered Date(s) Administered   ? COVID-19 (PFIZER tris-sucrose) mRNA vacc, 30 mcg/0.3 mL (PF) 11/23/2020   ? COVID-19 (PFIZER), mRNA vacc, 30 mcg/0.3 mL (PF) 10/14/2019, 11/11/2019, 07/04/2020   ? COVID-19 Bivalent (61YR+)(PFIZER), mRNA vacc, 28mcg/0.3mL 07/10/2021   ? Flu Vaccine =>3 YO (Historical) 07/16/2010, 07/16/2018   ? Flu Vaccine =>6 Months Quadrivalent PF 05/31/2019   ? Flu Vaccine =>65 YO High-Dose Quadrivalent (PF) 06/27/2020, 07/02/2021   ? Flu Vaccine Cell Cult Quad 4+YO (PF)(Flucelvax) 06/17/2017   ? H1N1 Vaccine 06/15/2008   ? Hepatitis A Vaccine 03/13/2020   ? Hepatitis A vaccine Adult IM 09/15/2020   ? Pneumococcal Vaccine (23-Val Adult) 07/16/2010, 10/23/2020   ? Tdap Vaccine 09/08/2015   ? Varicella-Zoster Vaccine - live (ZOSTAVAX) 04/30/2016, 05/17/2016   ? Zoster Vaccine Recombinant, Adjuvanted (shingles) IM (vial 2 of 2)(SHINGRIX) 09/02/2017, 01/18/2019, 04/12/2019   ? Zoster Vaccine Recombinant, adjuvant suspension component (vial 1 of 2)(SHINGRIX) 01/18/2019, 04/12/2019          Orders Placed This Encounter   ? nirmatrelvir-ritonavir (PAXLOVID) 300 mg (150 mg x 2)-100 mg tablets in a dose pack       Patient Instructions   It was nice seeing you today via telehealth.     I'm sorry you are dealing with COVID.   I have sent the Paxlovid to  your pharmacy.     HOLD YOUR STATIN (LIPITOR) WHILE TAKING THE PAXLOVID.     Attached are informational sheets on Paxlovid (nirmatrelvir/ritonavir) - the 5 day medication you should take twice per day to treat symptoms and a sheet on general COVID tip/information. You can find more specifics on the [facility] website.     Ok to use over the counter medications for symptoms:   - acetaminophen for aches, fever  - throat lozenges for sore throat, cough  - mucinex for cough, congestion.     If you develop shortness of breath or chest pain, please let us know immediately or go to the nearest emergency room.      How to reach Dr. Malachi Carl:  Please send a MyChart message to the General Medicine clinic or call Dr. Aggie Cosier nurse, Lyla Son, at [phone].  When calling please leave your name (including spelling), date of birth, phone number where you can be reached and a description of why you are calling.  Lyla Son will return your call as soon as possible.  If you need to send a fax to our office, our fax number is [phone].     Scheduling:  The General Medicine clinic scheduling line can be contacted at [phone] option 1.  Same day appointments are usually available for urgent care needs (note, this will be with the first available physician or nurse practitioner if Dr. Malachi Carl has no openings).     Lab Results:  Due to the CARES act, as of April 1st, 2021 lab results automatically release to MyChart.  Dr. Malachi Carl will send you a result note on any labs that she orders.       Please be aware of the [facility] and [facility] that may be good options for care when our clinic is not open to you.  Information included below.     [facility]  Located at the [facility]  [address]  [address]  [place], [place] [address]  Phone:  [phone]  [email or web address]  Open:  8:00 AM - 7:00 PM (CDT) Monday-Friday      Urgent Care Centers- [place] (Urgent Care centers are open every holiday except Christmas Day)     [facility]  [address]  Open 7:00 AM - 9:00 PM (CDT)  [phone]     [facility]  [address]  [phone]  Weekdays 9AM-9PM  Weekends 8AM-4PM     [facility]  [facility]  [address]  [phone]  Weekdays 8AM-6:30PM  Saturdays 10AM-4PM     [facility]  [address]  [phone]  Weekdays 5PM-9PM  Weekends 8AM- 1PM

## 2022-02-09 DIAGNOSIS — U071 COVID-19: Secondary | ICD-10-CM

## 2022-02-12 ENCOUNTER — Encounter: Admit: 2022-02-12 | Discharge: 2022-02-12 | Payer: MEDICARE

## 2022-02-12 NOTE — Telephone Encounter
Paged on call, started paxlovid Friday evening for COVID.  This AM took morning dose, started feeling feverish, lightheaded and vomited the morning dose.  These symptoms resolved after vomiting and have not recurred.  Overall her COVID symptoms are improved but is fatigued.  Recommended resuming the medication with next dose due.  Call clinic if worsening symptoms or persistent symptoms.

## 2022-02-26 ENCOUNTER — Encounter: Admit: 2022-02-26 | Discharge: 2022-02-26 | Payer: MEDICARE

## 2022-02-26 MED ORDER — ANASTROZOLE 1 MG PO TAB
ORAL_TABLET | ORAL | 0 refills | 33.00000 days | Status: AC
Start: 2022-02-26 — End: ?

## 2022-02-26 NOTE — Telephone Encounter
Reviewed last progress note from Dr. O'Dea to continue. RX sent at this time.

## 2022-04-29 ENCOUNTER — Encounter: Admit: 2022-04-29 | Discharge: 2022-04-29 | Payer: MEDICARE

## 2022-05-20 ENCOUNTER — Encounter: Admit: 2022-05-20 | Discharge: 2022-05-20 | Payer: MEDICARE

## 2022-05-24 ENCOUNTER — Encounter: Admit: 2022-05-24 | Discharge: 2022-05-24 | Payer: MEDICARE

## 2022-05-28 ENCOUNTER — Encounter: Admit: 2022-05-28 | Discharge: 2022-05-28 | Payer: MEDICARE

## 2022-05-28 DIAGNOSIS — R0683 Snoring: Secondary | ICD-10-CM

## 2022-06-06 ENCOUNTER — Encounter: Admit: 2022-06-06 | Discharge: 2022-06-06 | Payer: MEDICARE

## 2022-06-06 ENCOUNTER — Ambulatory Visit: Admit: 2022-06-06 | Discharge: 2022-06-07 | Payer: MEDICARE

## 2022-06-06 DIAGNOSIS — R0683 Snoring: Secondary | ICD-10-CM

## 2022-06-06 DIAGNOSIS — J302 Other seasonal allergic rhinitis: Secondary | ICD-10-CM

## 2022-06-06 DIAGNOSIS — E785 Hyperlipidemia, unspecified: Secondary | ICD-10-CM

## 2022-06-06 DIAGNOSIS — J3489 Other specified disorders of nose and nasal sinuses: Secondary | ICD-10-CM

## 2022-06-06 DIAGNOSIS — R051 Acute cough: Secondary | ICD-10-CM

## 2022-06-06 DIAGNOSIS — M1990 Unspecified osteoarthritis, unspecified site: Secondary | ICD-10-CM

## 2022-06-06 DIAGNOSIS — C50919 Malignant neoplasm of unspecified site of unspecified female breast: Secondary | ICD-10-CM

## 2022-06-06 MED ORDER — AMOXICILLIN-POT CLAVULANATE 875-125 MG PO TAB
1 | ORAL_TABLET | Freq: Two times a day (BID) | ORAL | 0 refills | 7.00000 days | Status: AC
Start: 2022-06-06 — End: ?

## 2022-06-06 MED ORDER — ALBUTEROL SULFATE 90 MCG/ACTUATION IN HFAA
2 | RESPIRATORY_TRACT | 0 refills | Status: AC | PRN
Start: 2022-06-06 — End: ?

## 2022-06-06 MED ORDER — BENZONATATE 100 MG PO CAP
100 mg | ORAL_CAPSULE | ORAL | 0 refills | 9.00000 days | Status: AC
Start: 2022-06-06 — End: ?

## 2022-06-06 NOTE — Progress Notes
Date of Service: 06/06/2022  MRN#: [id number]  DOB: 1956-04-14  PCP: Dimitri Ped    Subjective:   Chief Complaint   Patient presents with   ? Follow Up     Coughing, snoring      Alicia Sharp is a 66 y.o.female patient who presents for snoring.     Patient Reported Other  What topic(s) would you like to cover during your appointment?:  Snoring, cough  Please describe the issue(s) and history with the issue (location, severity, duration, symptoms, etc.).:  Was told i snore. I've been coughing for almost two weeks  What has been done so far to take care of the issue(s)?:  Breathe Right, OTC aids  What are your goals for this visit?:  Decide next steps       STOP BANG Questionnaire   Snoring:  Do you snore loudly (louder than talking or loud enough to be heard through closed doors)? Yes    Tired:  Do you often feel tired, fatigued, or sleepy during the day?  No   Observed:  Has anyone observed that you stop breathing during your sleep?  No   Blood Pressure:  Do you have or are you being treated for high blood pressure?  No   BMI  BMI more than 35 kg/m2?  Patient's BMI: Estimated body mass index is 32.37 kg/m? as calculated from the following:    Height as of this encounter: 162.6 cm (5' 4).    Weight as of this encounter: 85.5 kg (188 lb 9.6 oz).  No   Age  Age over 50 years?  Patient's age: 66 y.o.  Yes    Neck  Neck circumference greater than 40 cm?  No   Gender  Gender, female?  No     Total: 2   High risk of obstructive sleep apnea = answering ?yes? to 3 or more questions   Low risk of obstructive sleep apnea = answering ?yes? to less than 3 questions     Patient was on vacation with her friend and was told that she snores very loudly.  States that she does not know how long this has been going on for because she lives alone.  She denies any daily headaches or daytime sleepiness.  She did participate in a large study on 08/10/2021 and was told that she did not have sleep apnea.  She has tried Breathe Right strips and Flonase, which has not helped.  She does note that she can really only breathe out of 1 nostril at a time, this has been going on for months and months.    She does report that she has been acutely ill for the past 2 weeks since her vacation.  Her snoring episodes were prior to this illness.  She does feel like she has a pressure of fluid in her sinuses.  She has had a productive cough for the last 2 weeks.  She does have some shortness of breath that is exacerbated with exertion and she is fatigued.  She denies fever.  She has been able to eat and drink.  She did have a negative COVID test.  She has been taking Allegra and cough drops for her symptoms.    Medical History:   Diagnosis Date   ? Arthritis    ? Breast cancer (HCC) 07/2018    left breast   ? Hyperlipidemia    ? Seasonal allergies        Review of Systems  see HPI   Objective:     Medication:  ? albuterol sulfate (PROAIR HFA) 90 mcg/actuation HFA aerosol inhaler Inhale two puffs by mouth into the lungs every 6 hours as needed for Wheezing or Shortness of Breath.   ? amoxicillin-potassium clavulanate (AUGMENTIN) 875/125 mg tablet Take one tablet by mouth twice daily with meals.   ? anastrozole (ARIMIDEX) 1 mg tablet TAKE ONE TABLET BY MOUTH once daily   ? atorvastatin (LIPITOR) 20 mg tablet Take one tablet by mouth daily.   ? benzonatate (TESSALON PERLES) 100 mg capsule Take one capsule by mouth every 8 hours.   ? calcium carbonate (TUMS PO) Take  by mouth. Twice a day   ? cholecalciferol (VITAMIN D-3) 1,000 units tablet    ? ferrous sulfate (FEOSOL) 325 mg (65 mg iron) tablet Take one tablet by mouth every 48 hours. Take on an empty stomach at least 1 hour before or 2 hours after food.   ? fexofenadine (ALLEGRA) 180 mg tablet Take one tablet by mouth daily.   ? meloxicam (MOBIC) 15 mg tablet Take one tablet by mouth daily.       Vitals:    06/06/22 1605   BP: 137/81   BP Source: Arm, Left Upper   Pulse: 65   Temp: 37.2 ?C (98.9 ?F)   Resp: 12 SpO2: 97%   TempSrc: Oral   PainSc: Zero   Weight: 85.5 kg (188 lb 9.6 oz)   Height: 162.6 cm (5' 4)       Body mass index is 32.37 kg/m?Marland Kitchen    Physical Exam  Constitutional:       General: She is not in acute distress.     Appearance: Normal appearance. She is ill-appearing.   HENT:      Head: Normocephalic and atraumatic.      Right Ear: Tympanic membrane normal.      Left Ear: Tympanic membrane normal.      Nose:      Right Turbinates: Enlarged and swollen.      Left Turbinates: Enlarged and swollen.      Right Sinus: No maxillary sinus tenderness or frontal sinus tenderness.      Left Sinus: No maxillary sinus tenderness or frontal sinus tenderness.      Mouth/Throat:      Mouth: Mucous membranes are moist.      Pharynx: Oropharynx is clear. No oropharyngeal exudate or posterior oropharyngeal erythema.   Cardiovascular:      Rate and Rhythm: Normal rate and regular rhythm.      Pulses: Normal pulses.      Heart sounds: Normal heart sounds.   Pulmonary:      Effort: Pulmonary effort is normal. No respiratory distress.      Breath sounds: Wheezing (wheezing with coughing) present. No rales.      Comments: Patient coughing throughout visit  Musculoskeletal:         General: Normal range of motion.      Cervical back: Normal range of motion.   Skin:     General: Skin is warm and dry.   Neurological:      General: No focal deficit present.      Mental Status: She is alert. Mental status is at baseline.   Psychiatric:         Mood and Affect: Mood normal.          Assessment and Plan:    Loud snoring  - Patient was on vacation with her friend  and was told that she snores very loudly; states that she does not know how long this has been going on for because she lives alone  - She did participate in a large study on 08/10/2021 and was told that she did not have sleep apnea; she has tried Breathe Right strips and Flonase, which has not helped  - STOP BANG score of 2  - Will place referral to ENT for further evaluation - AMB REFERRAL TO ENT    Acute cough  Sinus pressure  - She does report that she has been acutely ill for the past 2 weeks since her vacation; her snoring episodes were prior to this illness  - On physical exam she is ill-appearing and coughing throughout visit; no fever and no crackles heard in lungs  - Will treat with Augmentin BID x7 days; albuterol as needed for shortness of breath and tessalon perles as needed for cough  - Continue with symptomatic management, maintain adequate fluid intake   - ER/return precautions given   - amoxicillin-potassium clavulanate (AUGMENTIN) 875/125 mg tablet; Take one tablet by mouth twice daily with meals.  Dispense: 14 tablet; Refill: 0  - albuterol sulfate (PROAIR HFA) 90 mcg/actuation HFA aerosol inhaler; Inhale two puffs by mouth into the lungs every 6 hours as needed for Wheezing or Shortness of Breath.  Dispense: 8.5 g; Refill: 0  - benzonatate (TESSALON PERLES) 100 mg capsule; Take one capsule by mouth every 8 hours.  Dispense: 20 capsule; Refill: 0    Orders Placed This Encounter   ? ENT   ? amoxicillin-potassium clavulanate (AUGMENTIN) 875/125 mg tablet   ? albuterol sulfate (PROAIR HFA) 90 mcg/actuation HFA aerosol inhaler   ? benzonatate (TESSALON PERLES) 100 mg capsule       Total of 30 minutes were spent on the same day of the visit including preparing to see the patient, obtaining and/or reviewing separately obtained history, performing a medically appropriate examination and/or evaluation, counseling and educating the patient/family/caregiver, ordering medications, tests, or procedures, referring and communication with other health care professionals, documenting clinical information in the electronic or other health record, independently interpreting results and communicating results to the patient/family/caregiver, and care coordination. I used a dictation program when conducting this note. Due to this, there may be grammatical errors or inconsistencies. For clarification or questions, please contact me.   Steward Ros, APRN-NP    Future Appointments   Date Time Provider Department Center   07/03/2022  8:40 AM Dionisi, Tacey Ruiz, MD [facility]   07/08/2022  1:15 PM [facility] - [facility] ROOM 1 [facility] Radiolog   07/08/2022  2:00 PM [facility] BREAST ROOM 2 [facility] Radiolog   07/08/2022  3:15 PM O'Dea, Daneil Dan, MD [facility]     Patient Instructions     It was nice to see you today! Thank you for coming into our clinic.    Today we came up with the following plan:    - I sent a prescription to your pharmacy to Augmentin. You will take this twice a day for 7 days. Please take a probiotic around midday. I sent an albuterol inhaler to help with shortness of breath. I sent tessalon perles to help with cough.     - You can take Mucinex for cough. I also recommend supportive care at this time. This includes: pushing oral fluids, throat lozenges, hot tea with honey, sinus saline rinse, warm/steamy showers and humidified air.     Please notify  the clinic or go to the emergency room if you develop sudden worsening of symptoms, fevers, chest pain, difficulty breathing, or other concerning symptoms.     Please call my nurse if you have any further questions or concerns, at [phone]. You can leave a voicemail if needed. You can also reach [place] by sending a secure message through MyChart.    Helpful Information:  ? We will contact you by MyChart or phone with any test results when they are available.   ? If you are expecting results and have not heard from [place] within 2 weeks of your testing, please send a MyChart message or call the clinic.  ? Please use the MyChart Refill request or contact your pharmacy directly to request medication refills. - Please allow at least 72 hours for refill requests.  ? For appointments: Our Scheduling phone number is [phone].   ? If you were expecting a phone call to schedule a referral and haven't heard from the referring office within two weeks, please let us know.    Take care,  Ailene Ravel, DNP, APRN, FNP-BC      Future Appointments   Date Time Provider Department Center   07/03/2022  8:40 AM Dionisi, Tacey Ruiz, MD [facility]   07/08/2022  1:15 PM [facility] - [facility] ROOM 1 [facility] Radiolog   07/08/2022  2:00 PM [facility] - [facility] BREAST ROOM 2 [facility] Radiolog   07/08/2022  3:15 PM O'Dea, Daneil Dan, MD [facility]

## 2022-06-09 ENCOUNTER — Encounter: Admit: 2022-06-09 | Discharge: 2022-06-09 | Payer: MEDICARE

## 2022-06-09 MED ORDER — ANASTROZOLE 1 MG PO TAB
1 mg | ORAL_TABLET | Freq: Every day | ORAL | 0 refills
Start: 2022-06-09 — End: ?

## 2022-06-14 ENCOUNTER — Encounter: Admit: 2022-06-14 | Discharge: 2022-06-14 | Payer: MEDICARE

## 2022-06-14 MED ORDER — MELOXICAM 15 MG PO TAB
15 mg | ORAL_TABLET | Freq: Every day | ORAL | 0 refills
Start: 2022-06-14 — End: ?

## 2022-06-14 NOTE — Telephone Encounter
Requested Prescriptions   Pending Prescriptions Disp Refills   ? meloxicam (MOBIC) 15 mg tablet [Pharmacy Med Name: meloxicam 15 mg tablet] 270 tablet 0     Sig: TAKE ONE TABLET BY MOUTH ONCE DAILY       Analgesics: NSAIDS & COX2 Inhibitors Failed - 06/14/2022  3:59 PM        Failed - This refill cannot be delegated        Failed - Cr in normal range and within 360 days     Creatinine   Date Value Ref Range Status   12/25/2020 0.82 0.4 - 1.00 MG/DL Final   45/40/9811 9.14 0.4 - 1.00 MG/DL Final     Creatinine, POC   Date Value Ref Range Status   04/09/2021 1.0 0.4 - 1.00 MG/DL Final   78/29/5621 0.8 0.4 - 1.00 MG/DL Final              Failed - HGB in normal range and within 360 days     Hemoglobin   Date Value Ref Range Status   04/06/2021 11.6 (L) 12.0 - 15.0 GM/DL Final   30/86/5784 69.6 (L) 12.0 - 15.0 GM/DL Final              Failed - eGFR in normal range and within 360 days     eGFR Non African American   Date Value Ref Range Status   12/01/2018 >60 >60 mL/min Final     Comment:     The eGFR is not validated for use in drug dosing adjustments.  Continue to use   estimated creatinine clearance per dosing reference text.  Please contact the   Clinical Pharmacist for questions.     08/14/2018 >60 >60 mL/min Final     Comment:     The eGFR is not validated for use in drug dosing adjustments.  Continue to use   estimated creatinine clearance per dosing reference text.  Please contact the   Clinical Pharmacist for questions.       eGFR African American   Date Value Ref Range Status   12/01/2018 >60 >60 mL/min Final     Comment:     The eGFR is not validated for use in drug dosing adjustments.  Continue to use   estimated creatinine clearance per dosing reference text.  Please contact the   Clinical Pharmacist for questions.     08/14/2018 >60 >60 mL/min Final     Comment:     The eGFR is not validated for use in drug dosing adjustments.  Continue to use   estimated creatinine clearance per dosing reference text. Please contact the   Clinical Pharmacist for questions.       eGFR   Date Value Ref Range Status   12/25/2020 >60 >60 mL/min Final     Comment:     eGFR calculated using the CKD-EPIcr_R equation              Passed - Valid encounter within last 12 months     Recent Visits  Date Type Provider Dept   06/06/22 Office Visit Steward Ros, APRN-NP [facility]   02/08/22 Office Visit Telehealth Altenhofen, Rosina Lowenstein, MD [facility]   10/31/21 Office Visit Steward Ros, APRN-NP [facility]   07/02/21 Office Visit Dionisi, Tacey Ruiz, MD [facility]   04/06/21 Office Visit Steward Ros, APRN-NP [facility]   03/19/21  Office Visit Steward Ros, [place] [facility]   01/22/21 Office Visit Flanagin, Clide Dales, APRN-NP [facility]   12/20/20 Office Visit Steward Ros, APRN-NP [facility]   11/23/20 Office Visit Leonor Liv, MD [facility]   10/23/20 Office Visit Leonor Liv, MD [facility]   Showing recent visits within past 720 days and meeting all other requirements  Future Appointments  Date Type Provider Dept   07/03/22 Appointment Dionisi, Tacey Ruiz, MD [facility]   Showing future appointments within next 360 days and meeting all other requirements            Passed - BP completed in the last 12 months     BP Readings from Last 2 Encounters:   06/06/22 137/81   11/05/21 131/58

## 2022-06-17 ENCOUNTER — Encounter: Admit: 2022-06-17 | Discharge: 2022-06-17 | Payer: MEDICARE

## 2022-06-18 ENCOUNTER — Encounter: Admit: 2022-06-18 | Discharge: 2022-06-18 | Payer: MEDICARE

## 2022-07-01 ENCOUNTER — Encounter: Admit: 2022-07-01 | Discharge: 2022-07-01 | Payer: MEDICARE

## 2022-07-01 NOTE — Patient Instructions
Thank you for coming to see us today.   Please call our office or send a message through MyChart if you have any questions or concerns.          Dr. Anne O'Dea  Michelle Prager, APRN and Danielle Sistrunk, APRN  Nurses: Michele Kuester, Jerilyn DeRee and Adely Facer  Phone: [phone]   Fax: [phone]    [facility] [place] [facility]  [address]  [place], [place] [address]    [facility]  [address]  [place],  [address]

## 2022-07-03 ENCOUNTER — Encounter: Admit: 2022-07-03 | Discharge: 2022-07-03 | Payer: MEDICARE

## 2022-07-03 ENCOUNTER — Ambulatory Visit: Admit: 2022-07-03 | Discharge: 2022-07-04 | Payer: MEDICARE

## 2022-07-03 DIAGNOSIS — J701 Chronic and other pulmonary manifestations due to radiation: Secondary | ICD-10-CM

## 2022-07-03 DIAGNOSIS — J302 Other seasonal allergic rhinitis: Secondary | ICD-10-CM

## 2022-07-03 DIAGNOSIS — M1990 Unspecified osteoarthritis, unspecified site: Secondary | ICD-10-CM

## 2022-07-03 DIAGNOSIS — Z23 Encounter for immunization: Secondary | ICD-10-CM

## 2022-07-03 DIAGNOSIS — Z0000 Encounter for general adult medical examination without abnormal findings: Secondary | ICD-10-CM

## 2022-07-03 DIAGNOSIS — I700 Atherosclerosis of aorta: Secondary | ICD-10-CM

## 2022-07-03 DIAGNOSIS — E785 Hyperlipidemia, unspecified: Secondary | ICD-10-CM

## 2022-07-03 DIAGNOSIS — C50919 Malignant neoplasm of unspecified site of unspecified female breast: Secondary | ICD-10-CM

## 2022-07-03 MED ORDER — BENZONATATE 100 MG PO CAP
100 mg | ORAL_CAPSULE | ORAL | 1 refills | 9.00000 days | Status: AC
Start: 2022-07-03 — End: ?

## 2022-07-03 NOTE — Progress Notes
Date of Service: 07/03/2022    Alicia Sharp is a 66 y.o. female. DOB: 10-Jun-1956   MRN#: [id number]           History of Present Illness  Alicia Sharp is a 66 y.o. female presenting for Medicare annual wellness visit. She was last seen 06/2021 for follow up.    In the interim she was diagnosed with COVID in 01/2022 and received paxlovid.     She continues to follow with Dr. Biagio Borg for her breast cancer history. Most recent mammogram 06/2021 without any abnormality.      Notes some right hip pain. Seen by Orthopedics. Completed physical therapy.     Recently seen by Internal Medicine clinic for ongoing cough, sinusitis. Completed augmentin course. Tessalon perles help, would like some more. Cough is ongoing, combination of postnasal drip and coughing up clear sputum. No shortness of breath.     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 Considering [place] as next racing location. Good support from teammates who are also cancer survivors.     Had questions on GLP-1 agonist for weight loss. Discussed benefits and side effects. She has some friends who are on it. She is close to considering it. She will let us know when she wants to initiate. She is active with exercise and will continue trying to make mindful nutrition choices.          Review of Systems   Constitutional: Negative for chills, fever and weight loss.   HENT: Negative for congestion.    Eyes: Negative for visual disturbance.   Cardiovascular: Negative for chest pain, leg swelling and palpitations.   Respiratory: Negative for cough and shortness of breath.    Hematologic/Lymphatic: Does not bruise/bleed easily.   Skin: Negative for rash.   Musculoskeletal: Negative for back pain.   Gastrointestinal: Negative for abdominal pain, constipation, diarrhea, melena, nausea and vomiting.   Genitourinary: Negative for dysuria.   Neurological: Negative for dizziness and headaches.   Psychiatric/Behavioral: Negative for depression. The patient is not nervous/anxious.          Objective:     ? anastrozole (ARIMIDEX) 1 mg tablet TAKE 1 TABLET BY MOUTH once daily   ? atorvastatin (LIPITOR) 20 mg tablet Take one tablet by mouth daily.   ? calcium carbonate (TUMS PO) Take  by mouth. Twice a day   ? cholecalciferol (VITAMIN D-3) 1,000 units tablet    ? ferrous sulfate (FEOSOL) 325 mg (65 mg iron) tablet Take one tablet by mouth every 48 hours. Take on an empty stomach at least 1 hour before or 2 hours after food.   ? fexofenadine (ALLEGRA) 180 mg tablet Take one tablet by mouth daily.     Vitals:    07/03/22 0905   BP: (!) (P) 142/72   Pulse: 83   Resp: 14   SpO2: 96%   PainSc: Zero   Weight: (P) 83 kg (183 lb)   Height: 162.6 cm (5' 4)     Body mass index is 31.41 kg/m? (pended).     Wt Readings from Last 10 Encounters:   07/03/22 (P) 83 kg (183 lb)   06/06/22 85.5 kg (188 lb 9.6 oz)   11/05/21 80.9 kg (178 lb 6.4 oz)   10/31/21 78.9 kg (174 lb)   09/07/21 79.1 kg (174 lb 6.4 oz)   06/17/21 79.5 kg (175 lb 3.2 oz)   04/24/21 79.2 kg (174 lb 9.6 oz)   04/06/21 78.9 kg (  174 lb)   03/19/21 77.6 kg (171 lb)   01/22/21 75.4 kg (166 lb 3.2 oz)         Physical Exam  Vitals and nursing note reviewed.   Constitutional:       Appearance: Normal appearance.   HENT:      Head: Normocephalic and atraumatic.      Right Ear: Tympanic membrane normal.      Left Ear: Tympanic membrane normal.      Mouth/Throat:      Mouth: Mucous membranes are moist.   Eyes:      Extraocular Movements: Extraocular movements intact.      Pupils: Pupils are equal, round, and reactive to light.   Cardiovascular:      Rate and Rhythm: Normal rate and regular rhythm.      Heart sounds: Normal heart sounds.   Pulmonary:      Effort: Pulmonary effort is normal.      Breath sounds: Normal breath sounds.   Abdominal:      General: There is no distension.      Palpations: Abdomen is soft.   Musculoskeletal:         General: Normal range of motion.      Cervical back: Normal range of motion. Right lower leg: No edema.      Left lower leg: No edema.   Skin:     General: Skin is warm.      Findings: No bruising or rash.   Neurological:      General: No focal deficit present.      Mental Status: She is alert and oriented to person, place, and time.   Psychiatric:         Mood and Affect: Mood normal.         Behavior: Behavior normal.         Thought Content: Thought content normal.              Assessment and Plan:  Alicia Sharp?is a 66 y.o. female?with history of breast cancer who presents today?for?follow up.   ?  Health Maintenance  -?Cervical Cancer Screening: last pap smear unknown. Hx irregular pap smears, pap smear 01/18/2019 wnl, neg high-risk HPV, next would be due 2025  - Colon cancer screening: Colonoscopy 04/07/2019 without polyps. Repeat in?2030  - Breast cancer screening: hx IDC 2019, mammograms yearly management by Med Onc   - COVID vaccine: has received 3rd booster  - Shingles vaccine completed  - Tdap: next due 2027  -?HIV?negative 2021  Plan:  > AWV today  > Flu vaccine today.   > PCV-20 and COVID booster at later date, will make nursing appt  > Routine labs  ?  Obesity  -?On noom for the past year  - No history of medullary . No thyroid cancer in the family. No MEN syndrome  - Discontinued phentermine in 01/2021, reported minimal effectiveness.   - BMI 30  Plan:  > Counseled on lifestyle modifications including healthy dieting and exercise  > Counseled on different medications including?Metformin, topiramate, GLP-1 agonists?and stimulants. ?We discussed the different risks and side effect profiles of each.?Previously on phentermine. She is considering GLP-1 agonist. Will hold on medications at this time as she wishes to focus on increasing her activity and mindful dietary choices.    Osteopenia  -?BMD on 09/06/2020 with osteopenia  - Currently on anastrazole  Plan:  > Counseled on exercise   > Calcium + vit D supplementation  > Bone  density scan yearly managed per Med Onc  ?  Coronary Artery Disease  Hyperlipidemia  - Calcium score 22 on 12/2021  - Lipids 12/2020: Tchol 140, TG 58, HDL 57, LDL 79  Plan:  >?Continue atorvastatin 20 mg?  > repeat lipids today    Osteoarthritis   - of left knee, right hip  - completed PT for right hip in 2023  > Uses meloxicam regularly for years. Discussed switching to celebrex for less GI bleed risk in setting of anemia, she will try the celebrex    Acute cough  Post nasal drip  - recent course of augmentin 05/2022 with residual cough and post nasal drip  - prescribed more tessalon perles  ?  Pulmonary Nodules  - CT scan 5/11?pulmonary nodules and post-surgical changes in her breast on her CT.  -?Given her history of breast cancer,?notified?medical, surgical, and radiation oncologists   -?Repeat CT scan in?3 months with stable nodules.?  - CT on 09/2019 with unchanged sub centimeter left lung nodules since 12/2018. F/u in 18-24 months.??  - CT chest 11/2020: stable lung nodules, no further imaging required.   > No further imaging indicated  ?  Normocytic Anemia  - Hemoglobin 11.8, MCV 94.2 on 12/01/2018; Folic acid > 22.3, Vit B12 295  - Iron studies 05/2019 with iron 74, % sat 20, TIBC 362, ferritin 12  -?No overt signs of bleeding. Colonoscopy 8/26 without polyps.  -?Peripheral smear 06/02/2019 with no qualitative morphologic abnormalities.?Repeat CBC, peripheral smear?05/2020?with hgb stable 11.8, Iron studies with IDA on 05/2020  Plan:  > Oral iron supplementation  > Repeat cbc and iron studies today  ?  Hx Breast Cancer  - IDC ER/PR+ HER2-  - Lumpectomy/SLNB and RT 2019  - Follows with Dr. Biagio Borg  - Current therapy: anastrazole   - Undergoing yearly mammograms  ?  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  ??  Paresthesias;?Numbess and Tingling  Morton's Neuroma  - She states that she will hold off on further treatment at this time.    Return to clinic in 1 year.    Discussed with Dr. Randall An, MD  PGY-2 Internal Medicine    Alicia Sharp was seen today for wellness.    Diagnoses and all orders for this visit:    Need for COVID-19 vaccine  -     FLU VACCINE =>65 YO HIGH DOSE QUAD (PF) (FLUZONE)    Need for influenza vaccination  -     Cancel: COVID-19 VACCINE (>=12YO) (MODERNA) (2023 -2024 FORMULA) MRNA PF    Need for pneumococcal vaccination  -     Cancel: PNEUMOCOCCAL VACCINE 20-VAL    Acute cough  -     benzonatate (TESSALON PERLES) 100 mg capsule; Take one capsule by mouth every 8 hours.    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  ?

## 2022-07-03 NOTE — Progress Notes
Date of Service: 07/03/2022    Alicia Sharp is a 66 y.o. female.  DOB: June 24, 1956  MRN: [id number]     SUBJECTIVE       She presents today for an Annual Medicare Wellness visit.   Chief Complaint   Patient presents with   ? Wellness     Alicia Sharp is a 66 y.o. female presenting for Medicare annual wellness visit. She was last seen 06/2021 for follow up.    In the interim she was diagnosed with COVID in 01/2022 and received paxlovid.     She continues to follow with Dr. Biagio Borg for her breast cancer history. Most recent mammogram 06/2021 without any abnormality.      Notes some right hip pain. Seen by Orthopedics. Completed physical therapy.     Recently seen by Internal Medicine clinic for ongoing cough, sinusitis. Completed augmentin course. Tessalon perles help, would like some more. Cough is ongoing, combination of postnasal drip and coughing up clear sputum. No shortness of breath.     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 Considering [place] as next racing location. Good support from teammates who are also cancer survivors.     Had questions on GLP-1 agonist for weight loss. Discussed benefits and side effects. She has some friends who are on it. She is close to considering it. She will let us know when she wants to initiate. She is active with exercise and will continue trying to make mindful nutrition choices.      Problem List Review     I have reviewed and updated the problem list below and addressed acute and chronic conditions with patient or recommended a follow up plan.   Patient Active Problem List    Diagnosis   ? Atherosclerosis of aorta (HCC)   ? Normocytic anemia   ? BMI 28.0-28.9,adult   ? Pulmonary nodules   ? 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 Pure hypercholesterolemia   ? Health care maintenance   ? Paresthesias   ? Malignant neoplasm of upper-outer quadrant of left breast in female, estrogen receptor positive           Risk Review   Lab Draw:  No results found for: HGBA1C  POC:  Lab Results   Component Value Date/Time    A1C 5.3 10/23/2020 12:00 AM    A1C 5.3 01/18/2019 12:00 AM       Lab Results   Component Value Date    CHOL 140 12/25/2020     The 10-year ASCVD risk score (Arnett DK, et al., 2019) is: 7.1%    Values used to calculate the score:      Age: 23 years      Sex: Female      Is Non-Hispanic African American: No      Diabetic: No      Tobacco smoker: No      Systolic Blood Pressure: 142 mmHg      Is BP treated: No      HDL Cholesterol: 57 MG/DL      Total Cholesterol: 140 MG/DL  Preventive Medications   Statin discussion: I have reviewed the patient's ASCVD risk and patient: Patient is on statin    Aspirin discussion:  Patient has no history of cardiovascular disease and after risk-benefit discussion: not indicated*          Preventive Screening   I reviewed the health maintenance tab with the patient and  verified all proof of screenings are present in the chart and ordered outside records as appropriate.    Health Maintenance   Topic Date Due   ? RSV VACCINE (60 YEARS AND OLDER) (1 - 1-dose 60+ series) Never done   ? PNEUMOCOCCAL VACCINE 65+ YRS (2 - PCV) 10/23/2021   ? INFLUENZA VACCINE (1) 03/12/2022   ? COVID-19 VACCINE (6 - 2023-24 season) 04/12/2022   ? BREAST CANCER SCREENING  07/11/2022   ? ADVANCE CARE PLANNING DISCUSSION AND DOCUMENTATION  06/13/2023 (Originally 08/12/2021)   ? PHYSICAL (COMPREHENSIVE) EXAM  07/04/2023   ? MEDICARE ANNUAL WELLNESS VISIT  07/04/2023   ? DTAP/TDAP VACCINES (2 - Td or Tdap) 09/07/2025   ? COLORECTAL CANCER SCREENING  04/06/2029   ? OSTEOPOROSIS SCREENING/MONITORING  Completed   ? SHINGLES RECOMBINANT VACCINE  Completed   ? HEPATITIS C SCREENING  Completed   ? DEPRESSION SCREENING  Completed   ? CERVICAL CANCER SCREENING  Discontinued       Immunizations     ? COVID vaccine: Given today  ? Tdap: Up-to-date, records in chart  ? Pneumonia vaccine: Given today   ? Shingles: Up-to-date, records in chart  ? Influenza: given today    Health Risk Assessment Questionnaire     The patient completed a health risk assessment with results reviewed and addressed with patient.    Health Risk Assessment Questionnaire  Current Care  List of Providers you have seen in the last two years: all at [facility]  Are you receiving home health?: No  During the past 4 weeks, how would you rate your health in general?: Very Good    Outside Care  Since your last PCP visit, have you received care outside of [facility]?: No    Physical Activity  Do you exercise or are you physically active?: Yes  How many days a week do you usually exercise or are physically active?: 5  On days when you exercised or were physically active, how many minutes was the activity?: 45  During the past four weeks, what was the hardest physical activity you could do for at least two minutes?: Heavy    Diet  In the past month, were you worried whether your food would run out before you or your family had money to buy more?: No  In the past 7 days, how many times did you eat fast food or junk food or pizza?: (!) 2  In the past 7 days, how many servings of fruits or vegetables did you eat each day?: (!) 2-3  In the past 7 days, how many sodas and sugar sweetened drinks (regular, not diet) did you drink each day?: 1    Smoke/Tobacco Use  Are you currently a smoker?: No    Alcohol Use  Do you drink alcohol?: Yes  Are you Female or Female?: Female  Female: In the last three months, have you had >3 alcoholic beverages in any one day or >7 in any one week?: (!) Yes    Depression Screen  Little interest or pleasure in doing things: Not at All  Feeling down, depressed or hopeless: Not at All    Pain  How would you rate your pain today?: Very mild pain    Ambulation  Do you use any assistive devices for ambulation?: No    Fall Risk  Does it take you longer than 30 seconds to get up and out of a chair?: No  Have you fallen  in the past year?: No    Motor Vehicle Safety  Do you fasten your seat belt when you are in the car?: Yes    Sun Exposure  Do you protect yourself from the sun? For example, wear sunscreen when outside.: Yes    Hearing Loss  Do you have trouble hearing the television or radio when others do not?: No  Do you have to strain or struggle to hear/understand conversation?: No  Do you use hearing aids?: No    Cognitive Impairment  During the past 12 months, have you experienced confusion or memory loss that is happening more often or is getting worse?: No    Functional Screen  Do you live alone?: Yes  Do you live at: Home  Can you drive your own car or travel alone by bus or taxi?: Yes  Can you shop for groceries or clothes without help?: Yes  Can you prepare your own meals?: Yes  Can you do your own housework without help?: Yes  Can you handle your own money without help?: Yes  Do you need help eating, bathing, dressing, or getting around your home?: No  Do you feel safe?: Yes  Does anyone at home hurt you, hit you, or threaten you?: No  Have you ever been the victim of abuse?: No    Home Safety  Does your home have grab bars in the bathroom?: (!) No  Does your home have hand rails on stairs and steps?: No stairs at home  Does your home have functioning smoke alarms?: Yes    Advance Directive  Do you have a living will or Advance Directive?: Yes    Dental Screen  Have you had an exam by your dentist in the last year?: Yes    Vision Screen  Do you have diabetes?: No    Urinary Incontinence  Have you had urine leakage in the past 6 months?: No  Does your urine leakage negatively impact your daily activities or sleep?: (not recorded)    Social Determinants of Health     Social Determinants of Health with Concerns     Stress: Not on file       History Review     Past Medical History:   Diagnosis Date   ? Arthritis    ? Breast cancer (HCC) 07/2018    left breast   ? Hyperlipidemia    ? Seasonal allergies      Past Surgical History:   Procedure Laterality Date   ? COLONOSCOPY N/A 04/07/2019    COLONOSCOPY DIAGNOSTIC WITH SPECIMEN COLLECTION BY BRUSHING/ WASHING - FLEXIBLE performed by Onnie Boer, MD at [facility] OR   ? HX ROTATOR CUFF REPAIR      R 08/2010. L 09/2015.   ? HX WISDOM TEETH EXTRACTION     ? KNEE ARTHROSCOPY Left     2014   ? LYMPH NODE BIOPSY Left 08/17/2018    LEFT SENTINEL LYMPH NODE BIOPSY performed by Ignatius Specking, MD at [facility] OR/PERIOP   ? MASTECTOMY, PARTIAL Left 08/17/2018    LEFT RADIOACTIVE SEED LOCALIZED LUMPECTOMY performed by Ignatius Specking, MD at [facility] OR/PERIOP      reports that she has never smoked. She has never used smokeless tobacco. She reports current alcohol use of about 5.0 standard drinks of alcohol per week. She reports that she does not use drugs.  Family History   Problem Relation Age of Onset   ? Migraines Mother    ?  Heart Disease Father    ? Stroke Father    ? Coronary Artery Disease Father    ? Cancer-Breast Maternal Aunt         30s   ? Cancer-Prostate Maternal Grandfather    ? Migraines Maternal Grandfather                  No Known Allergies  Review of Systems         Review of Systems   Constitutional: Negative for chills and unexpected weight change.   HENT: Positive for postnasal drip. Negative for sore throat.    Eyes: Negative for visual disturbance.   Respiratory: Positive for cough. Negative for chest tightness and shortness of breath.    Cardiovascular: Negative for chest pain and palpitations.   Gastrointestinal: Negative for abdominal pain and blood in stool.   Genitourinary: Negative for dysuria and hematuria.   Musculoskeletal: Positive for arthralgias. Negative for myalgias.   Neurological: Negative for dizziness.   Psychiatric/Behavioral: Negative for sleep disturbance.       OBJECTIVE     Vitals:    07/03/22 0905   BP: (!) (P) 142/72   Pulse: 83   Resp: 14   SpO2: 96%   PainSc: Zero   Weight: (P) 83 kg (183 lb)   Height: 162.6 cm (5' 4)     Body mass index is 31.41 kg/m? (pended). Physical Exam  Vitals reviewed.   Constitutional:       Appearance: Normal appearance.   HENT:      Head: Normocephalic and atraumatic.      Nose: No congestion.      Mouth/Throat:      Mouth: Mucous membranes are moist.   Eyes:      Extraocular Movements: Extraocular movements intact.   Cardiovascular:      Rate and Rhythm: Normal rate and regular rhythm.      Pulses: Normal pulses.      Heart sounds: Normal heart sounds.   Pulmonary:      Effort: Pulmonary effort is normal.      Breath sounds: Normal breath sounds.   Abdominal:      General: Abdomen is flat.      Palpations: Abdomen is soft.   Musculoskeletal:         General: Normal range of motion.      Cervical back: Normal range of motion.      Right lower leg: No edema.      Left lower leg: No edema.   Skin:     General: Skin is warm.   Neurological:      General: No focal deficit present.      Mental Status: She is alert and oriented to person, place, and time. Mental status is at baseline.   Psychiatric:         Mood and Affect: Mood normal.         Thought Content: Thought content normal.         Mini-Cog  Steps:   1. Tell patient 3 words.   2. Patient draws clock at 11:10: Normal clock, 2 points  3. Patient recalls words: Recalled 2 words, 2 points  Score and follow up: 3-5 points - lower risk of dementia, referral on case-by-case basis    Get-up-and-go Test  Less than 12 seconds, normal    Medications   I have completed a medication reconciliation, discussed medication adherence, and reviewed the list for high-risk medications (BEERS) and results are below:     ?  anastrozole (ARIMIDEX) 1 mg tablet TAKE 1 TABLET BY MOUTH once daily   ? atorvastatin (LIPITOR) 20 mg tablet Take one tablet by mouth daily.   ? benzonatate (TESSALON PERLES) 100 mg capsule Take one capsule by mouth every 8 hours.   ? calcium carbonate (TUMS PO) Take  by mouth. Twice a day   ? cholecalciferol (VITAMIN D-3) 1,000 units tablet    ? ferrous sulfate (FEOSOL) 325 mg (65 mg iron) tablet Take one tablet by mouth every 48 hours. Take on an empty stomach at least 1 hour before or 2 hours after food.   ? fexofenadine (ALLEGRA) 180 mg tablet Take one tablet by mouth daily.          ASSESSMENT AND PLAN       Personal prevention plan reviewed with patient and is accessible via patient's After Visit Summary and visit note.    Alicia Sharp?is a 66 y.o. female?with history of breast cancer who presents today?for?follow up.   ?  Health Maintenance  -?Cervical Cancer Screening: last pap smear unknown. Hx irregular pap smears, pap smear 01/18/2019 wnl, neg high-risk HPV, next would be due 2025  - Colon cancer screening: Colonoscopy 04/07/2019 without polyps. Repeat in?2030  - Breast cancer screening: hx IDC 2019, mammograms yearly management by Med Onc   - COVID vaccine: has received 3rd booster  - Shingles vaccine completed  - Tdap: next due 2027  -?HIV?negative 2021  Plan:  > AWV today  > Flu vaccine today.   > PCV-20 and COVID booster at later date, will make nursing appt  > Routine labs  ?  Obesity  -?On noom for the past year  - No history of medullary . No thyroid cancer in the family. No MEN syndrome  - Discontinued phentermine in 01/2021, reported minimal effectiveness.   - BMI 30  Plan:  > Counseled on lifestyle modifications including healthy dieting and exercise  > Counseled on different medications including?Metformin, topiramate, GLP-1 agonists?and stimulants. ?We discussed the different risks and side effect profiles of each.?Previously on phentermine. She is considering GLP-1 agonist. Will hold on medications at this time as she wishes to focus on increasing her activity and mindful dietary choices.    Osteopenia  -?BMD on 09/06/2020 with osteopenia  - Currently on anastrazole  Plan:  > Counseled on exercise   > Calcium + vit D supplementation  > Bone density scan yearly managed per Med Onc  ?  Coronary Artery Disease  Hyperlipidemia  - Calcium score 22 on 12/2021  - Lipids 12/2020: Tchol 140, TG 58, HDL 57, LDL 79  Plan:  >?Continue atorvastatin 20 mg?  > repeat lipids today    Osteoarthritis   - of left knee, right hip  - completed PT for right hip in 2023  > Uses meloxicam regularly for years. Discussed switching to celebrex for less GI bleed risk in setting of anemia, she will try the celebrex    Acute cough  Post nasal drip  - recent course of augmentin 05/2022 with residual cough and post nasal drip  - prescribed more tessalon perles  ?  Pulmonary Nodules  - CT scan 5/11?pulmonary nodules and post-surgical changes in her breast on her CT.  -?Given her history of breast cancer,?notified?medical, surgical, and radiation oncologists   -?Repeat CT scan in?3 months with stable nodules.?  - CT on 09/2019 with unchanged sub centimeter left lung nodules since 12/2018. F/u in 18-24 months.??  - CT chest 11/2020: stable lung nodules,  no further imaging required.   > No further imaging indicated  ?  Normocytic Anemia  - Hemoglobin 11.8, MCV 94.2 on 12/01/2018; Folic acid > 22.3, Vit B12 161  - Iron studies 05/2019 with iron 74, % sat 20, TIBC 362, ferritin 12  -?No overt signs of bleeding. Colonoscopy 8/26 without polyps.  -?Peripheral smear 06/02/2019 with no qualitative morphologic abnormalities.?Repeat CBC, peripheral smear?05/2020?with hgb stable 11.8, Iron studies with IDA on 05/2020  Plan:  > Oral iron supplementation  > Repeat cbc and iron studies today  ?  Hx Breast Cancer  - IDC ER/PR+ HER2-  - Lumpectomy/SLNB and RT 2019  - Follows with Dr. Biagio Borg  - Current therapy: anastrazole   - Undergoing yearly mammograms  ?  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  ??  Paresthesias;?Numbess and Tingling  Morton's Neuroma  - She states that she will hold off on further treatment at this time.    Return to clinic in 1 year.    Discussed with Dr. Randall An, MD  PGY-2 Internal Medicine      [name] was seen today for wellness.    Diagnoses and all orders for this visit:    Need for COVID-19 vaccine  -     FLU VACCINE =>65 YO HIGH DOSE QUAD (PF) (FLUZONE)    Need for influenza vaccination  -     Cancel: COVID-19 VACCINE (>=12YO) (MODERNA) (2023 -2024 FORMULA) MRNA PF    Need for pneumococcal vaccination  -     Cancel: PNEUMOCOCCAL VACCINE 20-VAL    Acute cough  -     benzonatate (TESSALON PERLES) 100 mg capsule; Take one capsule by mouth every 8 hours.    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 benzonatate (TESSALON PERLES) 100 mg capsule     Sig: Take one capsule by mouth every 8 hours.     Dispense:  20 capsule     Refill:  1     Order Specific Question:   Collaborating / Supervising Provider     Answer:   Lorrene Reid [id number]     Medications Discontinued During This Encounter   Medication Reason   ? celecoxib (CELEBREX) 100 mg capsule Patient's Choice   ? benzonatate (TESSALON PERLES) 100 mg capsule Patient's Choice   ? amoxicillin-potassium clavulanate (AUGMENTIN) 875/125 mg tablet Patient's Choice   ? albuterol sulfate (PROAIR HFA) 90 mcg/actuation HFA aerosol inhaler Patient's Choice     Patient Instructions   Thank you kindly for seeing [place] today. Below is a summary of the plans we discussed during your visit.    Let's make the switch to celebrex from meloxicam for knee pain. The prescription should be at [facility].  Fasting labwork today on the 1st floor in this building.     Flu vaccine today. Please eschedule a nursing vaccine visit for Pneumonia vaccine (PCV 20) and COVID new booster at your convenience.     We sent another prescription for [facility]essalon perles to [facility].     [name] was seen today for wellness.    Diagnoses and all orders for this visit:    Need for COVID-19 vaccine  -     FLU VACCINE =>65 YO HIGH DOSE QUAD (PF) (FLUZONE)    Need for influenza vaccination  -     Cancel: COVID-19 VACCINE (>=12YO) (MODERNA) (2023 -2024 FORMULA) MRNA PF    Need for pneumococcal vaccination  -     Cancel: PNEUMOCOCCAL VACCINE 20-VAL    Acute cough  -     benzonatate (TESSALON PERLES) 100 mg capsule; Take one capsule by mouth every 8 hours.    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  Our fax: [phone] (attention Dr. Tacey Ruiz Yarelie Hams).  Afterhours clinic number for emergent questions is [phone].        Visit Disposition     Dispositions Check-out Note    ? Return in about 1 year (around 07/04/2023), or Annual visit.   Can make nursing vaccine visit for PCV 20 and new COVID booster at later date

## 2022-07-04 DIAGNOSIS — M8580 Other specified disorders of bone density and structure, unspecified site: Secondary | ICD-10-CM

## 2022-07-04 DIAGNOSIS — R051 Acute cough: Secondary | ICD-10-CM

## 2022-07-04 DIAGNOSIS — E7800 Pure hypercholesterolemia, unspecified: Secondary | ICD-10-CM

## 2022-07-04 DIAGNOSIS — D509 Iron deficiency anemia, unspecified: Secondary | ICD-10-CM

## 2022-07-08 ENCOUNTER — Encounter: Admit: 2022-07-08 | Discharge: 2022-07-08 | Payer: MEDICARE

## 2022-07-08 ENCOUNTER — Ambulatory Visit: Admit: 2022-07-08 | Discharge: 2022-07-08 | Payer: MEDICARE

## 2022-07-08 DIAGNOSIS — Z79890 Hormone replacement therapy (postmenopausal): Secondary | ICD-10-CM

## 2022-07-08 DIAGNOSIS — E7800 Pure hypercholesterolemia, unspecified: Secondary | ICD-10-CM

## 2022-07-08 DIAGNOSIS — E785 Hyperlipidemia, unspecified: Secondary | ICD-10-CM

## 2022-07-08 DIAGNOSIS — C50412 Malignant neoplasm of upper-outer quadrant of left female breast: Secondary | ICD-10-CM

## 2022-07-08 DIAGNOSIS — M8589 Other specified disorders of bone density and structure, multiple sites: Secondary | ICD-10-CM

## 2022-07-08 DIAGNOSIS — Z923 Personal history of irradiation: Secondary | ICD-10-CM

## 2022-07-08 DIAGNOSIS — C50919 Malignant neoplasm of unspecified site of unspecified female breast: Secondary | ICD-10-CM

## 2022-07-08 DIAGNOSIS — J302 Other seasonal allergic rhinitis: Secondary | ICD-10-CM

## 2022-07-08 DIAGNOSIS — M1990 Unspecified osteoarthritis, unspecified site: Secondary | ICD-10-CM

## 2022-07-08 DIAGNOSIS — Z1231 Encounter for screening mammogram for malignant neoplasm of breast: Secondary | ICD-10-CM

## 2022-07-08 DIAGNOSIS — D509 Iron deficiency anemia, unspecified: Secondary | ICD-10-CM

## 2022-07-08 DIAGNOSIS — M8580 Other specified disorders of bone density and structure, unspecified site: Secondary | ICD-10-CM

## 2022-07-08 DIAGNOSIS — Z0000 Encounter for general adult medical examination without abnormal findings: Secondary | ICD-10-CM

## 2022-07-08 DIAGNOSIS — Z79811 Long term (current) use of aromatase inhibitors: Secondary | ICD-10-CM

## 2022-07-08 LAB — COMPREHENSIVE METABOLIC PANEL
ALBUMIN: 4.4 g/dL (ref 3.5–5.0)
ALK PHOSPHATASE: 71 U/L (ref 25–110)
ALT: 17 U/L (ref 7–56)
ANION GAP: 8 (ref 3–12)
BLD UREA NITROGEN: 21 mg/dL — ABNORMAL LOW (ref 7–25)
CALCIUM: 9.8 mg/dL (ref 8.5–10.6)
CO2: 28 MMOL/L (ref 21–30)
EGFR: >60 mL/min (ref >60)
TOTAL BILIRUBIN: 0.4 mg/dL (ref 0.3–1.2)
TOTAL PROTEIN: 7.3 g/dL (ref 6.0–8.0)

## 2022-07-08 LAB — CBC
HEMATOCRIT: 35 % — ABNORMAL LOW (ref 36–45)
MPV: 7.8 FL (ref 7–11)
RBC COUNT: 3.6 M/UL — ABNORMAL LOW (ref 4.0–5.0)
WBC COUNT: 5.7 K/UL (ref 4.5–11.0)

## 2022-07-08 LAB — LIPID PROFILE
CHOLESTEROL: 167 mg/dL — ABNORMAL LOW (ref <200)
HDL: 59 mg/dL (ref >40)
LDL: 93 mg/dL (ref <100)
TRIGLYCERIDES: 82 mg/dL (ref <150)
VLDL: 16 mg/dL (ref 3.5–5.1)

## 2022-07-08 LAB — IRON + BINDING CAPACITY + %SAT+ FERRITIN
IRON BINDING: 329 ug/dL (ref 270–380)
IRON: 61 ug/dL (ref 50–160)

## 2022-07-08 NOTE — Progress Notes
Name: DEMARCUS GUZMAN          MRN: [id number]      DOB: 1956-05-20      AGE: 66 y.o.   DATE OF SERVICE: 07/08/2022    Subjective:             Reason for Visit:  Heme/Onc Care       Cancer Staging   Malignant neoplasm of upper-outer quadrant of left breast in female, estrogen receptor positive   Staging form: Breast, AJCC 8th Edition  - Clinical: Stage IA (cT1c, cN0, cM0, G2, ER+, PR+, HER2-) - Signed by Carylon Perches, APRN on 08/25/2018  - Pathologic: Stage IA (pT1c, pN0(sn), cM0, G2, ER+, PR+, HER2-) - Signed by Carylon Perches, APRN on 08/25/2018      NAAVAH MESENBRINK is a 66 y.o. female who presented to the [facility] on 07/16/2018 at age 12 for evaluation of her left breast cancer. An abnormality was noted on screening mammogram. Left breast biopsy 07/02/18 revealed a grade 2, IDC at 12:00. S/p left lumpectomy/SLNB. She reports that she tolerated surgery generally well.  Completed adjuvant radiation.     Current Therapy:  Arimidex    History of Present Illness:    Talona returns today in f/u on Arimidex. She is taking the medicine and tolerating well. She denies any breast changes. She had a nice Thanksgiving.        Review of Systems   Constitutional: Negative for activity change, appetite change, chills, fatigue, fever and unexpected weight change.   HENT: Negative for congestion, dental problem, mouth sores, sore throat, tinnitus and trouble swallowing.    Eyes: Negative for visual disturbance.   Respiratory: Negative for cough, chest tightness, shortness of breath and wheezing.    Cardiovascular: Negative for chest pain, palpitations and leg swelling.   Gastrointestinal: Negative for abdominal distention, abdominal pain, blood in stool, constipation, diarrhea, nausea and vomiting.   Endocrine: Negative for heat intolerance.   Genitourinary: Negative for difficulty urinating, dysuria, frequency and urgency.   Musculoskeletal: Positive for arthralgias. Negative for back pain and joint swelling.   Skin: Negative for rash.   Neurological: Negative for dizziness, weakness, numbness and headaches.   Hematological: Negative for adenopathy. Does not bruise/bleed easily.   Psychiatric/Behavioral: Negative for sleep disturbance. The patient is not nervous/anxious.          Medical History:   Diagnosis Date   ? Arthritis    ? Breast cancer (HCC) 07/2018    left breast   ? Hormone replacement therapy    ? Hyperlipidemia    ? Personal history of irradiation    ? Seasonal allergies        Surgical History:   Procedure Laterality Date   ? LEFT RADIOACTIVE SEED LOCALIZED LUMPECTOMY Left 08/17/2018    Performed by Ign[facility]ius Specking, MD at [facility]   ? IDENTIFICATION SENTINEL LYMPH NODE Left 08/17/2018    Performed by Ign[facility]ius Spe[facility]ing, MD at [facility]   ? INJECTION RADIOACTIVE TRACER FOR SENTINEL NODE IDENTIFICATION Left 08/17/2018    Performed by Ign[facility]ius Spe[facility]ing, MD at [facility]   ? LEFT SENTINEL LYMPH NODE BIOPSY Left 08/17/2018    Performed by [facility]ius Specking, MD at [facility]   ? COLONOSCOPY DIAGNOSTIC WITH SPECIMEN COLLECTION BY BRUSHING/ WASHING - FLEXIBLE N/A 04/07/2019    Performed by Onnie Boer, MD at [facility]   ? HX ROTATOR CUFF REPAIR      R  08/2010. L 09/2015.   ? HX WISDOM TEETH EXTRACTION     ? KNEE ARTHROSCOPY Left     2014        Family History   Problem Relation Age of Onset   ? Migraines Mother    ? Bladder Cancer Mother    ? Heart Disease Father    ? Stroke Father    ? Coronary Artery Disease Father    ? Cancer-Breast Maternal Aunt         30s   ? Cancer-Prostate Maternal Grandfather    ? Migraines Maternal Grandfather         Social History     Socioeconomic History   ? Marital status: Widowed   ? Number of children: 2   Occupational History   ? Occupation: retired   Tobacco Use   ? Smoking status: Never   ? Smokeless tobacco: Never   ? Tobacco comments:     briefly in college   Substance and Sexual Activity   ? Alcohol use: Yes     Alcohol/week: 5.0 standard drinks of alcohol     Types: 5 Glasses of wine per week Comment: 1 glass of wine/evening   ? Drug use: Never   Social History Narrative    Here with daughter Denny Peon          Objective:     No Known Allergies        ? anastrozole (ARIMIDEX) 1 mg tablet TAKE 1 TABLET BY MOUTH once daily   ? atorvastatin (LIPITOR) 20 mg tablet Take one tablet by mouth daily.   ? benzonatate (TESSALON PERLES) 100 mg capsule Take one capsule by mouth every 8 hours.   ? calcium carbonate (TUMS PO) Take  by mouth. Twice a day   ? cholecalciferol (VITAMIN D-3) 1,000 units tablet    ? ferrous sulfate (FEOSOL) 325 mg (65 mg iron) tablet Take one tablet by mouth every 48 hours. Take on an empty stomach at least 1 hour before or 2 hours after food.   ? fexofenadine (ALLEGRA) 180 mg tablet Take one tablet by mouth daily.       Vitals:    07/08/22 1341   BP: 137/49   BP Source: Arm, Right Upper   Pulse: 70   Temp: 37 ?C (98.6 ?F)   Resp: 12   SpO2: 98%   TempSrc: Temporal   PainSc: Zero   Weight: 83.5 kg (184 lb)   Height: 162.6 cm (5' 4)     Body mass index is 31.58 kg/m?Marland Kitchen     Pain Score: Zero            Pain Addressed:  N/A    Patient Evaluated for a Clinical Trial: No treatment clinical trial available for this patient.     [place] Cooperative Oncology Group performance status is 0, Fully active, able to carry on all pre-disease performance without restriction.Marland Kitchen     Physical Exam  Vitals reviewed.   Constitutional:       General: She is not in acute distress.     Appearance: Normal appearance. She is well-developed. She is not diaphoretic.   HENT:      Head: Normocephalic and atraumatic.      Mouth/Throat:      Mouth: Mucous membranes are moist. No oral lesions.      Pharynx: Oropharynx is clear.   Eyes:      General: No scleral icterus.     Conjunctiva/sclera: Conjunctivae normal.  Pupils: Pupils are equal, round, and reactive to light.   Pulmonary:      Effort: Pulmonary effort is normal. No respiratory distress.   Chest:   Breasts:     Right: No inverted nipple, mass, nipple discharge, skin change or tenderness.      Left: No inverted nipple, mass, nipple discharge, skin change or tenderness.       Abdominal:      General: There is no distension.      Palpations: Abdomen is soft.      Tenderness: There is no abdominal tenderness. There is no guarding.   Musculoskeletal:         General: No swelling. Normal range of motion.      Cervical back: Normal range of motion and neck supple.   Lymphadenopathy:      Cervical: No cervical adenopathy.      Upper Body:      Right upper body: No supraclavicular or axillary adenopathy.      Left upper body: No supraclavicular or axillary adenopathy.   Skin:     General: Skin is warm and dry.      Coloration: Skin is not jaundiced or pale.      Findings: No rash.   Neurological:      Mental Status: She is alert and oriented to person, place, and time.      Cranial Nerves: No cranial nerve deficit.   Psychiatric:         Mood and Affect: Mood normal.         Behavior: Behavior normal.         Thought Content: Thought content normal.            PATHOLOGY:  Final Diagnosis:     A. Breast, left radioactive seed lumpectomy, 1 clip, 1 seed, lumpectomy:   Invasive ductal carcinoma, histologic grade 2; margins negative.   See synoptic report below.     B. Breast, left additional superior margin, excision:   Negative for malignancy.     C. Breast, left additional lateral margin, excision:   Negative for malignancy. ?     D. Breast, left additional anterior margin, excision:   Negative for malignancy. ?     E. Adipose tissue, left additional posterior margin, excision:   Negative for malignancy. ?     F. Adipose tissue, left additional medial margin, excision:   Negative for malignancy. ?     G. Breast, left additional inferior margin, excision:   Negative for malignancy. ?     H. Lymph nodes (2), left axillary sentinel lymph nodes, dissection:   There is no evidence of malignancy in two lymph nodes. ?(0/2)   Deeper sections and a pancytokeratin immunostain are negative in   support of the above diagnosis. ?       Comment:   INVASIVE CARCINOMA OF THE BREAST: RESECTION   CAP Version: Invasive Breast 4.3.0.1   Procedure: ?Lumpectomy   Laterality: Left ?   Tumor Site: ?12:00, 3 cm FTN, per clinical note   Histologic Type: Invasive ductal carcinoma, with focal cribriform features   Size of Invasive Component: ?1.8 x 1.6 x 1.2 cm     Surgical Margins: ?   Lumpectomy   Main lumpectomy specimen: Margins negative   Invasive carcinoma: The closest margin is <1 mm from medial margin   and 1mm from the posterior margin. All remaining margins are greater than   2 mm.   DCIS: The closest margin is 6  mm from posterior margin. All   remaining margins are greater than 2 mm.     Final shave margins:   All margins are negative for invasive and in-situ carcinoma.   ? ? ? ? ?   Histologic Grade (Nottingham Histologic Score): II / III   Tubule Formation: 3   Nuclear Grade: 2   Mitotic Count (40x objective): 1   Total Nottingham Score: ?6/9   Ductal Carcinoma In-situ (DCIS): ?Present, scant foci of DCIS, low grade   Extensive intraductal component (>25%): Absent   DCIS within and/or adjacent to invasive carcinoma: Yes   DCIS separate from invasive carcinoma: No ?   Lobular Carcinoma In-situ (LCIS): Absent   Lymph-Vascular Invasion: ?No   Nipple Involvement: Not Applicable   Skin Involvement: Not applicable   Skeletal Muscle: Not present   Lymph Node Sampling:   Sentinel lymph node(s) only   Total number of lymph nodes examined: 2   Number of lymph nodes with macrometastases (>2 mm): 0   Number of lymph nodes with micrometastases (>0.2 mm to 2 mm): 0   Number of lymph nodes with isolated tumor cells (<0.2 mm and   <200 cells): ?0   Size of largest metastatic deposit (millimeters): N/a   Extranodal extension: N/a   Prognostic markers: Ordered on block A11; the results will be issued as an   addendum.   Time between tumor removal and placement into formalin < 1 hour: ?Yes   Fixation Time between 6-72 hours: ?Yes     Pathologic Staging (pTNM, AJCC 8th edition): ?   pT1c ?snN0 ? Mn/a     Primary Tumor (Invasive Carcinoma) (pT)   pT1c: ? ? Tumor >10 mm but d20 mm in greatest dimension     Regional Lymph Nodes (pN)   (sn): ? ? Only sentinel node(s) evaluated. ?If 6 or more sentinel   nodes and/or nonsentinel nodes are removed, this modifier should not be   used.   pN0: ? ? No regional lymph node metastasis identified or ITCs only     Distant Metastasis (M) ?   Not applicable     The pathologic stage assigned here should be regarded as provisional, as   it reflects only current pathologic data and does not incorporate full   knowledge of the patient's clinical status and/or prior pathology.     Block for Biomarker Testing: A11       Recent Results (from the past 672 hour(s))   MAMMO SCREEN BILAT/TOMO    Collection Time: 07/08/22  1:21 PM    Narrative    [facility] [employer] [facility]  [place]  Imaging Mammography: [place]  [address]  [place] [address]  [phone]    EXAM:  MAMMO SCREEN BILAT/TOMO 07/08/22  1:21 PM     INDICATION:   Screening    COMPARISON:  Compared to:   01/12/2016 MAMMO SCREEN EXTERNAL IMAGING  07/04/2020 MAMMO SCREEN BILAT/TOMO/CAD  07/11/2021 MAMMO SCREEN BILAT/TOMO     BREAST COMPOSITION:   The breasts have scattered areas of fibroglandular density.         TECHNIQUE:  3-D (digital tomosynthesis) and synthetic 2-D images were obtained   bilaterally.    FINDINGS:  There are benign post-therapeutic findings.  No suspicious abnormality is   seen.      Impression    :    ASSESSMENT:  Left: 2 - Benign  Right: 2 - Benign  Overall:  2 - Benign     RECOMMENDATION AND DUE DATE:  Routine Screening Mammogram in 1 Year - Bilateral  07/10/2023    Electronically signed and approved by: Dorothy Spark, MD 07/08/2022   1:33 PM                   Assessment and Plan:  The patient is a 66 y.o. female with the following:    1.  Left grade 2, IDC (ER91-100%, PR91-100%, Her2 0+), at 12:00, dx 06/2018.  Clinical Stage IA; cT2 N0 M0.  She is now s/p lumpectomy and SLNB; pT1c N0 M0.   2. Decreased mental acuity   3. Osteopenia.       [place] is without evidence of disease recurrence at this time.    Continue adjuvant endocrine therapy with Arimidex.    Continue calcium, vitamin D, and WBE. Repeat DEXA in January. We have discussed osteopenia and may recommend bone strengthening agents after we have her DEXA results in January.     Continue a healthy lifestyle and regular exercise.    Mammogram today benign and results reviewed with pt. Next mammogram due 06/2023.    Pt has asked insightful questions, and all questions were answered.    She was encouraged to call with any persistent symptoms, questions, or concerns.    RTC in 6 months.     Dr. Biagio Borg is the collaborating physician.     Terrance Mass Harshan Kearley, APRN-NP                                                Total Time Today was 30 minutes in the following activities: Preparing to see the patient, Obtaining and/or reviewing separately obtained history, Performing a medically appropriate examination and/or evaluation, Counseling and educating the patient/family/caregiver, Ordering medications, tests, or procedures, Documenting clinical information in the electronic or other health record and Independently interpreting results (not separately reported) and communicating results to the patient/family/caregiver

## 2022-07-25 ENCOUNTER — Encounter: Admit: 2022-07-25 | Discharge: 2022-07-25 | Payer: MEDICARE

## 2022-07-25 ENCOUNTER — Ambulatory Visit: Admit: 2022-07-25 | Discharge: 2022-07-26 | Payer: MEDICARE

## 2022-07-25 DIAGNOSIS — Z23 Encounter for immunization: Secondary | ICD-10-CM

## 2022-07-25 NOTE — Progress Notes
Patient arrived in clinic for COVID vaccine. Verified name and dob. Obtained signed consent for vaccination. Order placed per protocol. Administered COVID vaccine to Right deltoid.  Patient tolerated well, no adverse reactions noted.   Lot [id number] Exp 02/09/23.     Appt.  Was also made for PCV 20 and pt requested to defer vaccine to a later time.   Daune Perch

## 2022-08-01 ENCOUNTER — Encounter: Admit: 2022-08-01 | Discharge: 2022-08-01 | Payer: MEDICARE

## 2022-08-01 ENCOUNTER — Ambulatory Visit: Admit: 2022-08-01 | Discharge: 2022-08-01 | Payer: MEDICARE

## 2022-08-01 DIAGNOSIS — Z9289 Personal history of other medical treatment: Secondary | ICD-10-CM

## 2022-08-01 DIAGNOSIS — R0981 Nasal congestion: Secondary | ICD-10-CM

## 2022-08-01 DIAGNOSIS — C50919 Malignant neoplasm of unspecified site of unspecified female breast: Secondary | ICD-10-CM

## 2022-08-01 DIAGNOSIS — E785 Hyperlipidemia, unspecified: Secondary | ICD-10-CM

## 2022-08-01 DIAGNOSIS — M1990 Unspecified osteoarthritis, unspecified site: Secondary | ICD-10-CM

## 2022-08-01 DIAGNOSIS — R0683 Snoring: Secondary | ICD-10-CM

## 2022-08-01 DIAGNOSIS — Z23 Encounter for immunization: Secondary | ICD-10-CM

## 2022-08-01 DIAGNOSIS — Z923 Personal history of irradiation: Secondary | ICD-10-CM

## 2022-08-01 DIAGNOSIS — Z79890 Hormone replacement therapy (postmenopausal): Secondary | ICD-10-CM

## 2022-08-01 DIAGNOSIS — J302 Other seasonal allergic rhinitis: Secondary | ICD-10-CM

## 2022-08-01 NOTE — Patient Instructions
Snore RX

## 2022-08-01 NOTE — Progress Notes
Date of Service: 08/01/2022    Alicia Sharp is a 66 y.o. female. DOB: 12/07/1955   MRN#: [id number]    Subjective:       History of Present Illness  Patient was referred by Ailene Ravel, APRN-NP and presents today with CC of loud noises. Onset was decades ago. Patient describes on a recent trip she had three different roommates and they told her that her snoring was extremely loud. She reports it was so loud she had multiple people that had to leave the room and not sleep near her.  Patient reports intermittent sleep disturbances but does not have a regular sleep partner and lives alone so does not have anyone to witness if patient suffers from apnea.  Patient reports she has had sleep study in the past which showed no sleep apnea but that it was some kind of trial or offer for free sleep study and she does not have the official results from it.  Patient does report that when sleeping on 1 side will have gravity dependent side becomes obstructed and if she rolls over for time she will be able to breathe through both sides and then after some time is passed she will have the new gravity-dependent side become obstructed.  We discussed that this is typically caused by turbinate swelling due to allergies and gravity dependence but is not necessarily pathologic.  Treatments attempted Breathe Right strips, Allergan, with little relief. Denies daytime fatigue, frequent strep infections, morning headaches, fever, chills, nausea or vomiting, unexpected weight loss, difficulty breathing, difficulty swallowing.             Review of Systems   Constitutional: Negative.   HENT: Negative.     Eyes: Negative.    Cardiovascular: Negative.    Respiratory:  Positive for snoring.    Endocrine: Negative.    Skin: Negative.    Musculoskeletal: Negative.    Gastrointestinal: Negative.    Genitourinary: Negative.    Neurological: Negative.    Psychiatric/Behavioral: Negative.     Allergic/Immunologic: Negative.          Objective: Medical History:   Diagnosis Date    Arthritis     Breast cancer (HCC) 07/2018    left breast    Hormone replacement therapy     Hyperlipidemia     Personal history of irradiation     Seasonal allergic reaction     Seasonal allergies        Surgical History:   Procedure Laterality Date    LEFT RADIOACTIVE SEED LOCALIZED LUMPECTOMY Left 08/17/2018    Performed by Ignatius Specking, MD at [facility]    IDENTIFICATION SENTINEL LYMPH NODE Left 08/17/2018    Performed by [facility]ing, [facility] at [facility]    INJECTION RADIOACTIVE TRACER FOR SENTINEL NODE IDENTIFICATION Left 08/17/2018    Performed by [facility]ing, MD at [facility]    LEFT SENTINEL LYMPH NODE BIOPSY Left 08/17/2018    Performed by [facility]ius [facility]ing, MD at [facility]    COLONOSCOPY DIAGNOSTIC WITH SPECIMEN COLLECTION BY BRUSHING/ WASHING - FLEXIBLE N/A 04/07/2019    Performed by Onnie Boer, MD at [facility]    HX ROTATOR CUFF REPAIR      R 08/2010. L 09/2015.    HX WISDOM TEETH EXTRACTION      KNEE ARTHROSCOPY Left     2014        anastrozole (ARIMIDEX) 1 mg tablet TAKE 1 TABLET BY MOUTH once  daily    atorvastatin (LIPITOR) 20 mg tablet Take one tablet by mouth daily.    benzonatate (TESSALON PERLES) 100 mg capsule Take one capsule by mouth every 8 hours.    calcium carbonate (TUMS PO) Take  by mouth. Twice a day    cholecalciferol (VITAMIN D-3) 1,000 units tablet     ferrous sulfate (FEOSOL) 325 mg (65 mg iron) tablet Take one tablet by mouth every 48 hours. Take on an empty stomach at least 1 hour before or 2 hours after food.    fexofenadine (ALLEGRA) 180 mg tablet Take one tablet by mouth daily.     Vitals:    08/01/22 0754   BP: (!) 148/80   Pulse: 76   Temp: 36.8 ?C (98.3 ?F)   PainSc: Zero   Weight: 84.6 kg (186 lb 9.6 oz)   Height: 162.6 cm (5' 4)     Body mass index is 32.03 kg/m?Marland Kitchen     Physical Exam  Constitutional:       General: She is not in acute distress.     Appearance: Normal appearance. She is not ill-appearing.   HENT:      Head: Normocephalic and atraumatic.      Jaw: There is normal jaw occlusion. No tenderness.      Salivary Glands: Right salivary gland is not diffusely enlarged or tender. Left salivary gland is not diffusely enlarged or tender.      Right Ear: Tympanic membrane, ear canal and external ear normal. There is no impacted cerumen.      Left Ear: Tympanic membrane, ear canal and external ear normal. There is no impacted cerumen.      Nose: Septal deviation present. No congestion or rhinorrhea.      Right Nostril: No epistaxis.      Left Nostril: No epistaxis.      Right Turbinates: Not enlarged.      Left Turbinates: Not enlarged.      Right Sinus: No maxillary sinus tenderness or frontal sinus tenderness.      Left Sinus: No maxillary sinus tenderness or frontal sinus tenderness.      Mouth/Throat:      Mouth: Mucous membranes are moist. No oral lesions.      Tongue: No lesions. Tongue does not deviate from midline.      Palate: No mass and lesions.      Pharynx: Oropharynx is clear. Uvula midline. No pharyngeal swelling, oropharyngeal exudate, posterior oropharyngeal erythema or uvula swelling.      Tonsils: No tonsillar exudate or tonsillar abscesses. 1+ on the right. 1+ on the left.      Comments: Oropharynx is very widely patent and tongue is of an appropriate size for patient's mouth  Eyes:      Extraocular Movements: Extraocular movements intact.      Conjunctiva/sclera: Conjunctivae normal.      Pupils: Pupils are equal, round, and reactive to light.   Pulmonary:      Effort: Pulmonary effort is normal. No respiratory distress.      Breath sounds: No stridor.   Musculoskeletal:         General: Normal range of motion.      Cervical back: Normal range of motion and neck supple. No tenderness.   Lymphadenopathy:      Cervical: No cervical adenopathy.   Skin:     General: Skin is warm and dry.   Neurological:      General: No focal deficit present.  Mental Status: She is alert and oriented to person, place, and time. Mental status is at baseline.   Psychiatric:         Mood and Affect: Mood normal.         Behavior: Behavior normal.         Thought Content: Thought content normal.         Judgment: Judgment normal.              Assessment and Plan:  1. Loud snoring        2. History of sleep study        3. Nasal congestion            Impression: Patient reports very loud snoring reported by multiple friends on recent trip.  Patient lives alone and was told by late husband that she does snore.  She denies daytime sleepiness or difficulty functioning during the day due to being tired.  She reports negative sleep study showing no obstructive sleep apnea but does not have the records.  On physical exam she has a mild septal deviation but feels she breathes well through her nose during the day.  She does have some nasal sinus allergies which she feels are well-controlled on her current regimen.  She does not have large obstructive tonsils or oropharyngeal anatomy.    Plan: Recommend patient obtain snore Rx nightguard to determine if pursuing oral dental appliance would be warranted.  Patient does live alone and has no regular sleep partner recommended she have family member observe her sleeping some night with night guard and another night without night guard to determine if there is any improvement in her snoring as at this time her insurance has denied obtaining a new sleep study as she does not have any symptoms of sleep apnea.  Additionally recommended patient continue nasal sinus regimen and discussed that she could start doing nasal sinus rinses to help relieve any inflammation of the nasal cavity in addition to her regular sinus regimen and continue wearing Breathe Right strip to help allow her to breathe through her nose rather than mouth at night.  Patient is agreeable to plan.    Follow up: As needed.  Discussed that if she feels she starts having daytime sleepiness or other signs of obstructive sleep apnea she should follow-up with her PCP to obtain new sleep study.    Total time 40 minutes, with 30 minutes spent face to face with patient.  Total time reflects time spent on the date of visit Preparing to see the patient, Obtaining/Reviewing history, Performing exam, Counseling/Educating the patient/family/caregiver, Communicating with other health care professions , Documenting clinical information, and Coordinating care.     ATTESTATION  This visit was documented by Charlestine Massed via audio recorded by Luvenia Redden, APRN-NP, on 08/01/2022 8:36 AM.       Luvenia Redden, APRN-NP  Department of Otolaryngology, AGACNP-BC    Phone # [phone]  Fax # [phone]  Email [email or web address]

## 2022-08-08 ENCOUNTER — Encounter: Admit: 2022-08-08 | Discharge: 2022-08-08 | Payer: MEDICARE

## 2022-08-08 DIAGNOSIS — M25512 Pain in left shoulder: Secondary | ICD-10-CM

## 2022-08-09 ENCOUNTER — Encounter: Admit: 2022-08-09 | Discharge: 2022-08-09 | Payer: MEDICARE

## 2022-08-13 ENCOUNTER — Encounter: Admit: 2022-08-13 | Discharge: 2022-08-13 | Payer: MEDICARE

## 2022-08-13 DIAGNOSIS — R0683 Snoring: Secondary | ICD-10-CM

## 2022-08-13 DIAGNOSIS — R400 Somnolence: Secondary | ICD-10-CM

## 2022-08-13 DIAGNOSIS — R5383 Other fatigue: Secondary | ICD-10-CM

## 2022-08-14 ENCOUNTER — Encounter: Admit: 2022-08-14 | Discharge: 2022-08-14 | Payer: MEDICARE

## 2022-08-20 ENCOUNTER — Ambulatory Visit: Admit: 2022-08-20 | Discharge: 2022-08-20 | Payer: MEDICARE

## 2022-08-20 ENCOUNTER — Encounter: Admit: 2022-08-20 | Discharge: 2022-08-20 | Payer: MEDICARE

## 2022-08-20 DIAGNOSIS — M25512 Pain in left shoulder: Secondary | ICD-10-CM

## 2022-08-20 DIAGNOSIS — M7582 Other shoulder lesions, left shoulder: Secondary | ICD-10-CM

## 2022-08-20 DIAGNOSIS — Z923 Personal history of irradiation: Secondary | ICD-10-CM

## 2022-08-20 DIAGNOSIS — C50919 Malignant neoplasm of unspecified site of unspecified female breast: Secondary | ICD-10-CM

## 2022-08-20 DIAGNOSIS — M1990 Unspecified osteoarthritis, unspecified site: Secondary | ICD-10-CM

## 2022-08-20 DIAGNOSIS — J302 Other seasonal allergic rhinitis: Secondary | ICD-10-CM

## 2022-08-20 DIAGNOSIS — E785 Hyperlipidemia, unspecified: Secondary | ICD-10-CM

## 2022-08-20 DIAGNOSIS — Z79890 Hormone replacement therapy (postmenopausal): Secondary | ICD-10-CM

## 2022-08-20 NOTE — Patient Instructions
It was a pleasure seeing you today. Please contact us if you have any further questions, we are happy to assist.     Dr. Matthew Vopat - Orthopedic Surgeon, Sports Medicine  [facility] - Phone [phone] - Fax [phone]   [address], [address] - [place], [place] [address]    To schedule an appointment, please call our scheduling line at [phone]

## 2022-08-22 ENCOUNTER — Encounter: Admit: 2022-08-22 | Discharge: 2022-08-22 | Payer: MEDICARE

## 2022-08-27 ENCOUNTER — Encounter: Admit: 2022-08-27 | Discharge: 2022-08-27 | Payer: MEDICARE

## 2022-09-03 ENCOUNTER — Encounter: Admit: 2022-09-03 | Discharge: 2022-09-03 | Payer: MEDICARE

## 2022-09-06 ENCOUNTER — Encounter: Admit: 2022-09-06 | Discharge: 2022-09-06 | Payer: MEDICARE

## 2022-09-09 ENCOUNTER — Ambulatory Visit: Admit: 2022-09-09 | Discharge: 2022-09-09 | Payer: MEDICARE

## 2022-09-09 ENCOUNTER — Encounter: Admit: 2022-09-09 | Discharge: 2022-09-09 | Payer: MEDICARE

## 2022-09-09 DIAGNOSIS — C50412 Malignant neoplasm of upper-outer quadrant of left female breast: Secondary | ICD-10-CM

## 2022-09-09 DIAGNOSIS — Z79811 Long term (current) use of aromatase inhibitors: Secondary | ICD-10-CM

## 2022-09-09 DIAGNOSIS — M8589 Other specified disorders of bone density and structure, multiple sites: Secondary | ICD-10-CM

## 2022-09-11 ENCOUNTER — Encounter: Admit: 2022-09-11 | Discharge: 2022-09-11 | Payer: MEDICARE

## 2022-09-16 ENCOUNTER — Encounter: Admit: 2022-09-16 | Discharge: 2022-09-16 | Payer: MEDICARE

## 2022-09-19 ENCOUNTER — Encounter: Admit: 2022-09-19 | Discharge: 2022-09-19 | Payer: MEDICARE

## 2022-09-23 ENCOUNTER — Encounter: Admit: 2022-09-23 | Discharge: 2022-09-23 | Payer: MEDICARE

## 2022-09-23 ENCOUNTER — Ambulatory Visit: Admit: 2022-09-23 | Discharge: 2022-09-23 | Payer: MEDICARE

## 2022-09-23 DIAGNOSIS — R5383 Other fatigue: Secondary | ICD-10-CM

## 2022-09-23 DIAGNOSIS — R400 Somnolence: Secondary | ICD-10-CM

## 2022-09-23 DIAGNOSIS — R0683 Snoring: Secondary | ICD-10-CM

## 2022-09-27 ENCOUNTER — Encounter: Admit: 2022-09-27 | Discharge: 2022-09-27 | Payer: MEDICARE

## 2022-09-30 ENCOUNTER — Encounter: Admit: 2022-09-30 | Discharge: 2022-09-30 | Payer: MEDICARE

## 2022-10-02 ENCOUNTER — Encounter: Admit: 2022-10-02 | Discharge: 2022-10-02 | Payer: MEDICARE

## 2022-10-02 DIAGNOSIS — R0681 Apnea, not elsewhere classified: Secondary | ICD-10-CM

## 2022-10-02 NOTE — Progress Notes
Sleep study is abnormal.     Sleep study report should be uploaded in EPIC soon, please see the final report for details.

## 2022-10-03 ENCOUNTER — Encounter: Admit: 2022-10-03 | Discharge: 2022-10-03 | Payer: MEDICARE

## 2022-10-07 ENCOUNTER — Encounter: Admit: 2022-10-07 | Discharge: 2022-10-07 | Payer: MEDICARE | Primary: Maternal Newborn

## 2022-10-07 ENCOUNTER — Ambulatory Visit: Admit: 2022-10-07 | Discharge: 2022-10-08 | Payer: MEDICARE | Primary: Maternal Newborn

## 2022-10-07 DIAGNOSIS — Z79890 Hormone replacement therapy (postmenopausal): Secondary | ICD-10-CM

## 2022-10-07 DIAGNOSIS — Z923 Personal history of irradiation: Secondary | ICD-10-CM

## 2022-10-07 DIAGNOSIS — J302 Other seasonal allergic rhinitis: Secondary | ICD-10-CM

## 2022-10-07 DIAGNOSIS — R0683 Snoring: Secondary | ICD-10-CM

## 2022-10-07 DIAGNOSIS — E785 Hyperlipidemia, unspecified: Secondary | ICD-10-CM

## 2022-10-07 DIAGNOSIS — G4733 Obstructive sleep apnea (adult) (pediatric): Secondary | ICD-10-CM

## 2022-10-07 DIAGNOSIS — C50919 Malignant neoplasm of unspecified site of unspecified female breast: Secondary | ICD-10-CM

## 2022-10-07 DIAGNOSIS — M1990 Unspecified osteoarthritis, unspecified site: Secondary | ICD-10-CM

## 2022-10-07 NOTE — Progress Notes
Obtained patient's verbal consent to treat them and their agreement to [facility] financial policy and NPP via this telehealth visit during the [facility] Emergency.    Date of Service: 10/07/2022    Alicia Sharp is a 67 y.o. female. DOB: 1956-08-02   MRN#: [id number]    Subjective:   Chief Complaint   Patient presents with    Results     Sleep study    Follow Up          Alicia Sharp is 67 y.o. patient who presents for telehealth visit.   Patient Reported Other  What topic(s) would you like to cover during your appointment?:  Sleep study follow-up  Please describe the issue(s) and history with the issue (location, severity, duration, symptoms, etc.).:  Loud snoring reported by others; ENT didn't note any structural issues  What has been done so far to take care of the issue(s)?:  Answered above  What are your goals for this visit?:  Discuss options    Patient presents today to discuss results of her sleep study, which did show mild obstructive sleep apnea.  She followed with ENT and was told everything looked good from a structural perspective.  She was surprised by the amount of time she was supine on the study. States that she feels like when she is laying flat at the gym, her airway is restricted.  States that she knows she needs to focus on some weight loss, has had a 20 pound weight gain in the last 3 years.  She has done Noom in the past and had good results with that program, however she is wanting something more substantial.  She does not wish to start medication at this time.  She is interested in getting a CPAP machine as she will be sleeping around others for her [employer] and is concerned about her snoring.     BP Readings from Last 5 Encounters:   08/01/22 (!) 148/80   07/08/22 137/49   07/03/22 (!) (P) 142/72   06/06/22 137/81   11/05/21 131/58     Wt Readings from Last 5 Encounters:   10/07/22 82.6 kg (182 lb)   08/20/22 84.6 kg (186 lb 8.2 oz)   08/01/22 84.6 kg (186 lb 9.6 oz) 07/08/22 83.5 kg (184 lb)   07/03/22 (P) 83 kg (183 lb)       Medical History:   Diagnosis Date    Arthritis     Breast cancer (HCC) 07/2018    left breast    Hormone replacement therapy     Hyperlipidemia     Osteoarthritis 06/2013    Personal history of irradiation     Seasonal allergic reaction     Seasonal allergies        Surgical History:   Procedure Laterality Date    LEFT RADIOACTIVE SEED LOCALIZED LUMPECTOMY Left 08/17/2018    Performed by Ignatius Specking, MD at [facility]    IDENTIFICATION SENTINEL LYMPH NODE Left 08/17/2018    Performed by Ignatius Specking, MD at [facility]    INJECTION RADIOACTIVE TRACER FOR SENTINEL NODE IDENTIFICATION Left 08/17/2018    Performed by Ignatius Specking, MD at [facility]    LEFT SENTINEL LYMPH NODE BIOPSY Left 08/17/2018    Performed by Ignatius Specking, MD at [facility]    COLONOSCOPY DIAGNOSTIC WITH SPECIMEN COLLECTION BY BRUSHING/ WASHING - FLEXIBLE N/A 04/07/2019    Performed by Onnie Boer, MD at [facility]  [facility]    HX ROTATOR CUFF REPAIR      R 08/2010. L 09/2015.    HX WISDOM TEETH EXTRACTION      KNEE ARTHROSCOPY Left     2014    KNEE SURGERY  06/2013    L knee    SHOULDER SURGERY  R rotator 08/2010, L rotator2/2018       family history includes Anemia in her brother; Arthritis in her mother; Bladder Cancer in her mother; Cancer in her mother; Cancer-Breast in her maternal aunt; Cancer-Prostate in her maternal grandfather; Coronary Artery Disease in her father; Heart Disease in her father; High Cholesterol in her father and mother; Hypertension in her father and mother; Migraines in her maternal grandfather and mother; Stroke in her father and maternal grandmother.    Social History     Socioeconomic History    Marital status: Widowed    Number of children: 2   Occupational History    Occupation: retired   Tobacco Use    Smoking status: Never    Smokeless tobacco: Never    Tobacco comments:     briefly in college   Substance and Sexual Activity    Alcohol use: Yes     Alcohol/week: 5.0 standard drinks of alcohol     Types: 5 Glasses of wine per week     Comment: 1 glass of wine/evening    Drug use: Never    Sexual activity: Not Currently     Partners: Male     Birth control/protection: Post-menopausal   Social History Narrative    Here with daughter Denny Peon       Social History     Tobacco Use    Smoking status: Never    Smokeless tobacco: Never    Tobacco comments:     briefly in college   Substance Use Topics    Alcohol use: Yes     Alcohol/week: 5.0 standard drinks of alcohol     Types: 5 Glasses of wine per week     Comment: 1 glass of wine/evening        ROS  See HPI   Objective:      anastrozole (ARIMIDEX) 1 mg tablet TAKE 1 TABLET BY MOUTH once daily    atorvastatin (LIPITOR) 20 mg tablet Take one tablet by mouth daily.    calcium carbonate (TUMS PO) Take  by mouth. Twice a day    celecoxib (CELEBREX) 100 mg capsule Take one capsule by mouth twice daily.    cholecalciferol (VITAMIN D-3) 1,000 units tablet     ferrous sulfate (FEOSOL) 325 mg (65 mg iron) tablet Take one tablet by mouth every 48 hours. Take on an empty stomach at least 1 hour before or 2 hours after food.    fexofenadine (ALLEGRA) 180 mg tablet Take one tablet by mouth daily.     Vitals:    10/07/22 9147   PainSc: Two   Weight: 82.6 kg (182 lb)   Height: 162.6 cm (5' 4)     Body mass index is 31.24 kg/m?Marland Kitchen     Physical Exam  Constitutional:       General: She is not in acute distress.     Appearance: Normal appearance. She is not ill-appearing.   HENT:      Head: Normocephalic and atraumatic.   Pulmonary:      Effort: Pulmonary effort is normal. No respiratory distress.   Neurological:      Mental Status: She is alert and  oriented to person, place, and time.   Psychiatric:         Mood and Affect: Mood normal.         Greater than 20 minutes spent during this patient encounter with >50% of the time counseling and coordinating care. Consultation time included patient assessment, obtaining patient history, discussion of medical issues, discussing available data and test results, and educating patient regarding conditions and treatment options as well as potential risks and benefits. Additional time was spent on documentation, reviewing patient history and prior office notes, interpreting data and test results, and ordering medications, tests, and procedures as appropriate.          Assessment and Plan:    Obstructive sleep apnea  Loud snoring  - Discussed results of sleep study, she would like to proceed with getting a CPAP  - Recommend follow up with Pulmonary/Sleep for assistance with getting CPAP machine  - She would like to pursue lifestyle and dietary modifications for weight loss, discussed strategies and resources        Future Appointments   Date Time Provider Department Center   10/07/2022  2:30 PM Zachery Conch, PT [facility]   10/17/2022  2:30 PM Zachery Conch, PT [facility]   10/24/2022  2:30 PM Zachery Conch, PT [facility]   01/07/2023  1:45 PM O'Dea, Daneil Dan, MD [facility] [place] [facility]   07/04/2023  8:30 AM Dionisi, Tacey Ruiz, MD [facility]     Patient Instructions   It was nice to see you today! Thank you for coming into our clinic.    Please notify the clinic or go to the emergency room if you develop sudden worsening of symptoms, fevers, chest pain, difficulty breathing, or other concerning symptoms.     Please call my nurse if you have any further questions or concerns, at [phone]. You can leave a voicemail if needed. You can also reach [place] by sending a secure message through MyChart.    Helpful Information:  We will contact you by MyChart or phone with any test results when they are available.   If you are expecting results and have not heard from [place] within 2 weeks of your testing, please send a MyChart message or call the clinic.  Please use the MyChart Refill request or contact your pharmacy directly to request medication refills. - Please allow at least 72 hours for refill requests.  For appointments: Our Scheduling phone number is [phone].   If you were expecting a phone call to schedule a referral and haven't heard from the referring office within two weeks, please let us know.    Take care,  Ailene Ravel, DNP, APRN, FNP-BC      Future Appointments   Date Time Provider Department Center   10/07/2022  2:30 PM Zachery Conch, [place] [facility]   10/17/2022  2:30 PM Zachery Conch, PT [facility]   10/24/2022  2:30 PM Zachery Conch, PT [facility]   01/07/2023  1:45 PM O'Dea, Daneil Dan, MD [facility] [place] [facility]   07/04/2023  8:30 AM Dionisi, Tacey Ruiz, MD [facility]        Steward Ros, APRN-NP

## 2022-10-07 NOTE — Patient Instructions
It was nice to see you today! Thank you for coming into our clinic.    Please notify the clinic or go to the emergency room if you develop sudden worsening of symptoms, fevers, chest pain, difficulty breathing, or other concerning symptoms.     Please call my nurse if you have any further questions or concerns, at [phone]. You can leave a voicemail if needed. You can also reach [place] by sending a secure message through [place].    Helpful Information:  We will contact you by MyChart or phone with any test results when they are available.   If you are expecting results and have not heard from [place] within 2 weeks of your testing, please send a MyChart message or call the clinic.  Please use the MyChart Refill request or contact your pharmacy directly to request medication refills. - Please allow at least 72 hours for refill requests.  For appointments: Our Scheduling phone number is [phone].   If you were expecting a phone call to schedule a referral and haven't heard from the referring office within two weeks, please let us know.    Take care,  Rodman Pickle, DNP, APRN, FNP-BC      Future Appointments   Date Time Provider [place]   10/07/2022  2:30 PM Georga Hacking, [place] [facility]   10/17/2022  2:30 PM Georga Hacking, PT [facility]   10/24/2022  2:30 PM Georga Hacking, PT [facility]   01/07/2023  1:45 PM O'Dea, Ludger Nutting, MD [facility] [place] [facility]   07/04/2023  8:30 AM Dionisi, Denny Peon, MD [facility]

## 2022-10-14 ENCOUNTER — Encounter: Admit: 2022-10-14 | Discharge: 2022-10-14 | Payer: MEDICARE

## 2022-10-15 ENCOUNTER — Ambulatory Visit: Admit: 2022-10-15 | Discharge: 2022-10-16 | Payer: MEDICARE

## 2022-10-15 ENCOUNTER — Encounter: Admit: 2022-10-15 | Discharge: 2022-10-15 | Payer: MEDICARE

## 2022-10-15 DIAGNOSIS — M1990 Unspecified osteoarthritis, unspecified site: Secondary | ICD-10-CM

## 2022-10-15 DIAGNOSIS — E785 Hyperlipidemia, unspecified: Secondary | ICD-10-CM

## 2022-10-15 DIAGNOSIS — J302 Other seasonal allergic rhinitis: Secondary | ICD-10-CM

## 2022-10-15 DIAGNOSIS — Z79890 Hormone replacement therapy (postmenopausal): Secondary | ICD-10-CM

## 2022-10-15 DIAGNOSIS — R0683 Snoring: Secondary | ICD-10-CM

## 2022-10-15 DIAGNOSIS — Z923 Personal history of irradiation: Secondary | ICD-10-CM

## 2022-10-15 DIAGNOSIS — G4733 Obstructive sleep apnea (adult) (pediatric): Secondary | ICD-10-CM

## 2022-10-15 DIAGNOSIS — C50919 Malignant neoplasm of unspecified site of unspecified female breast: Secondary | ICD-10-CM

## 2022-10-15 NOTE — Progress Notes
Telehealth Visit Note    Date of Service: 10/15/2022    Subjective:      Reason for consult: sleep apnea     Alicia Sharp is a 67 y.o. female.    History of Present Illness  Alicia Sharp presents to sleep medicine clinic to discuss snoring, go over her sleep study results.  She is a pleasant 67 year old female with history of breast cancer status post lumpectomy and radiation, hyperlipidemia, who had reported some loud snoring to her PCP which prompted home sleep apnea test.  Results showed mild sleep apnea only with 3% desaturation.  By 4% she was in the normal range.  She is here to discuss these things today.  She does note that noisy breathing was noted even 20 years ago by her daughters, confirmed by bed partner's throughout the years, but in the last few years snoring has become much louder when she is on trips for [employer] team.    Patient's sleep habits are described below:    Regular bed time: 8-830pm getting ready, 9-930 reading  Regular sleep onset latency (how long to fall asleep): usually 7-10 minutes   Number of awakenings per night: multiple times a night most nights, some for restroom  Sleep latency after awakenings: usually quite fast  Usual wake time: 530am  How does pt feel when waking up at that time? Mostly like I'm not ready to get up    How many naps in a week? denies  How long are the naps on average? N/a  How do you feel after a nap? N/a    Any reports of snoring during sleep? Some noises but snoring more pronounced recently  Any reports of witnessed apneas during sleep? denies    Any drowsiness while watching TV, reading, and in low stimulus environments? Some issues with sedentary activities  Any drowsiness while talking, eating, or driving? denies  Any motor vehicle accidents related to sleepiness? denies    Any memory or concentration issues in the last few years? Some concerns for a few years now  How many morning headaches have you had in the last month? denies  What weight changes have you had in the last 2 years? Spring 2021 lost about 30+ pounds, gained some back in last 2 years  How often do you have restless leg symptoms per month? denies    Any reports of talking/punching/kicking in your sleep? denies  Any reports of sleepwalking? denies  Any episodes of sleep paralysis? denies    What caffeine do you drink in a day? 2 mugs of coffee, occasional tea  When is the last drink of caffeine in your day? Mid to late morning  How many alcoholic drinks in a week? A glass of wine with lunch or dinner  How many cigarettes/nicotine in a week? denies  Do you use any other drugs? denies    Does anyone in your family have any sleep issues?  Denies    She has visited with ENT, there was no surgical intervention deemed necessary at that time.  She was treated with a snore Rx card which did improve her daytime somnolence somewhat or energy levels, but she felt like her bite was not normalizing until later in the afternoon.  She has also spoken with her dentist about a smaller oral appliance therapy.             10/15/2022     1:57 PM 09/16/2022     4:41 PM  Epworth Sleepiness Scale   Sitting and reading 2 1   Watching TV 1 1   Sitting inactive in a public place (e.g. a theater or a meeting) 1 1   As a passenger in a car for an hour without a break 0 2   Lying down to rest in the afternoon when circumstances permit 2 3   Sitting and talking to someone 1 0   Sitting quietly after a lunch without alcohol 2 1   In a car, while stopped for a few minutes in traffic 0 0   Epworth Sleepiness Scale Score 9 9            Review of Systems   Constitutional:  Negative for chills, diaphoresis, fatigue and fever.   HENT:  Positive for congestion, postnasal drip and sore throat.    Eyes:  Negative for pain, redness and itching.   Respiratory:  Positive for wheezing (fast walking up the hill). Negative for cough and shortness of breath.    Cardiovascular:  Positive for leg swelling (with large salt intake). Negative for chest pain and palpitations.   Gastrointestinal:  Negative for abdominal pain, constipation, diarrhea, nausea and vomiting.   Genitourinary:  Negative for difficulty urinating, dysuria and hematuria.   Musculoskeletal:  Positive for arthralgias (arimidex side effect she feels) and myalgias (rotator cuffs). Negative for back pain and neck pain.   Skin:  Negative for color change and rash.   Allergic/Immunologic: Positive for environmental allergies. Negative for food allergies.   Neurological:  Positive for numbness (bottom of R foot). Negative for dizziness, seizures, weakness and headaches.   Hematological:  Negative for adenopathy. Does not bruise/bleed easily.   Psychiatric/Behavioral:  Negative for decreased concentration and dysphoric mood. The patient is not nervous/anxious.          Objective:          anastrozole (ARIMIDEX) 1 mg tablet TAKE 1 TABLET BY MOUTH once daily    atorvastatin (LIPITOR) 20 mg tablet Take one tablet by mouth daily.    calcium carbonate (TUMS PO) Take  by mouth. Twice a day    celecoxib (CELEBREX) 100 mg capsule Take one capsule by mouth twice daily.    cholecalciferol (VITAMIN D-3) 1,000 units tablet     ferrous sulfate (FEOSOL) 325 mg (65 mg iron) tablet Take one tablet by mouth every 48 hours. Take on an empty stomach at least 1 hour before or 2 hours after food.    fexofenadine (ALLEGRA) 180 mg tablet Take one tablet by mouth daily.          Telehealth Patient Reported Vitals       Row Name 10/15/22 1530                Weight: 82.9 kg (182 lb 12.8 oz)        Height: 162.6 cm (5' 4)                      Telehealth Body Mass Index: 31.38 at 10/15/2022  4:37 PM    Physical Exam  Constitutional:       Appearance: Normal appearance.   HENT:      Head: Normocephalic and atraumatic.   Pulmonary:      Effort: Pulmonary effort is normal.   Neurological:      Mental Status: She is alert.   Psychiatric:         Mood and Affect: Mood normal.  Behavior: Behavior normal. Thought Content: Thought content normal.         Judgment: Judgment normal.         Home sleep apnea test report reviewed from 09/23/2022.  9.5 hours of study time with estimated sleep efficiency 94%, estimated sleep 8.9 hours.  pAHI 3% was 7.2, pAHI 4% was 2.0.  There did not seem to be a significant difference between supine and nonsupine.  Supine sleep accounted for 65% of the sleep study.  REM sleep did increase the pAHI, 3% up to 16.6.  Oxygen saturation nadir was 85% with 0.1 minutes spent less than 88%.  Consistent with mild obstructive sleep apnea by 3% criteria, no significant apnea by 4% criteria.     Assessment and Plan:  67 year old female with history of breast cancer s/p surgery and XRT, HLD, snoring, HSAT without significant OSA but some concerns  Problem   Loud Snoring    Noted some noisy breathing even 20+ years ago, snoring has worsened in the last year or so and seems to coincide with some weight gain           Loud snoring  Reviewed her home sleep apnea test showing no significant sleep apnea based upon 4% criteria  -There was no significant desaturation, she denies significant sleepiness during the day, she is not hypertensive  -We would recommend continued follow-up with dentist for possible oral appliance for snoring primarily and if that is not helpful or further issues develop happy to retest in the sleep lab setting to see if any different results are found there    Follow up PRN for now    Ulyess Blossom, MD  Seen and discussed with Dr. Andria Meuse.           45 minutes spent on this patient's encounter with counseling and coordination of care taking >50% of the visit.

## 2022-10-15 NOTE — Patient Instructions
Clinic Visit Summary:     It was very nice to meet you today.  We see on your sleep study no significant obstructive sleep apnea, no significant drops in your oxygen.  At this point, would recommend exploring oral appliance therapy through your dentist strictly for snoring.  If that is not helpful or you have further symptoms, happy to discuss another test in the sleep lab setting to see if those results are different and would afford other treatment options.  We will follow-up with you as needed for now unless we are alerted otherwise.  We hope you have a fantastic year.        For questions or concerns,  please contact my Nurse Rachael at [phone] or send a My Chart message.     For URGENT issues after business hours, weekends, or holidays: Call [phone] and request for the Pulmonary Fellow to be paged.  For medication refills, please ask your pharmacy to send an electronic request.  Please allow at least 3 business days for medication refills.    For equipment issues, please contact your Durable Medical Equipment (DME) company directly.  To cancel, change, or schedule a clinic appointment: Call Pulmonary Scheduling at [phone].

## 2022-10-15 NOTE — Assessment & Plan Note
Reviewed her home sleep apnea test showing no significant sleep apnea based upon 4% criteria  -There was no significant desaturation, she denies significant sleepiness during the day, she is not hypertensive  -We would recommend continued follow-up with dentist for possible oral appliance for snoring primarily and if that is not helpful or further issues develop happy to retest in the sleep lab setting to see if any different results are found there

## 2022-10-17 ENCOUNTER — Encounter: Admit: 2022-10-17 | Discharge: 2022-10-17 | Payer: MEDICARE

## 2022-10-21 ENCOUNTER — Encounter: Admit: 2022-10-21 | Discharge: 2022-10-21 | Payer: MEDICARE

## 2022-10-24 ENCOUNTER — Encounter: Admit: 2022-10-24 | Discharge: 2022-10-24 | Payer: MEDICARE

## 2022-10-30 ENCOUNTER — Encounter: Admit: 2022-10-30 | Discharge: 2022-10-30 | Payer: MEDICARE

## 2022-11-06 ENCOUNTER — Encounter: Admit: 2022-11-06 | Discharge: 2022-11-06 | Payer: MEDICARE

## 2022-11-14 ENCOUNTER — Encounter: Admit: 2022-11-14 | Discharge: 2022-11-14 | Payer: MEDICARE

## 2022-11-20 ENCOUNTER — Encounter: Admit: 2022-11-20 | Discharge: 2022-11-20 | Payer: MEDICARE

## 2022-12-09 ENCOUNTER — Encounter: Admit: 2022-12-09 | Discharge: 2022-12-09 | Payer: MEDICARE

## 2022-12-09 ENCOUNTER — Ambulatory Visit: Admit: 2022-12-09 | Discharge: 2022-12-10 | Payer: MEDICARE

## 2022-12-09 DIAGNOSIS — Z79890 Hormone replacement therapy (postmenopausal): Secondary | ICD-10-CM

## 2022-12-09 DIAGNOSIS — M1990 Unspecified osteoarthritis, unspecified site: Secondary | ICD-10-CM

## 2022-12-09 DIAGNOSIS — E785 Hyperlipidemia, unspecified: Secondary | ICD-10-CM

## 2022-12-09 DIAGNOSIS — Z923 Personal history of irradiation: Secondary | ICD-10-CM

## 2022-12-09 DIAGNOSIS — J302 Other seasonal allergic rhinitis: Secondary | ICD-10-CM

## 2022-12-09 DIAGNOSIS — R079 Chest pain, unspecified: Secondary | ICD-10-CM

## 2022-12-09 DIAGNOSIS — G4733 Obstructive sleep apnea (adult) (pediatric): Secondary | ICD-10-CM

## 2022-12-09 DIAGNOSIS — C50919 Malignant neoplasm of unspecified site of unspecified female breast: Secondary | ICD-10-CM

## 2022-12-09 MED ORDER — CYCLOBENZAPRINE 5 MG PO TAB
5 mg | ORAL_TABLET | Freq: Every evening | ORAL | 0 refills | 30.00000 days | Status: AC | PRN
Start: 2022-12-09 — End: ?

## 2022-12-09 NOTE — Telephone Encounter
Patient called she got pulled down by her dog last Tuesday, She had physical therapy the next day for her shoulder. She landed hard on her chest. Had difficulty with movement due to chest pain. Daily exercises are improving. Had minimal pain associated this weekend but this morning when she got out of bed it hurt a little bit. She had thought it was just tight but as the day has gone on its hurting more to try and get a full breath in. Chest area is quite tender more so than it was last week. Checking in if she needs to be seen or do something at home.    Call back: [phone]    Raelene Bott, RN  CPK

## 2022-12-09 NOTE — Progress Notes
Date of Service: 12/09/2022  MRN#: [id number]  DOB: Dec 29, 1955  PCP: Dimitri Ped    Subjective:   Chief Complaint   Patient presents with    Chest Pain     Alicia Sharp is a 67 y.o.female patient who presents for an urgent care visit.     History of Present Illness      About a week ago Alicia Sharp was walking her dog and her dog spooked running forward.  This caused her to fall on her chest on a slope.  She did not hit her head.  The next day she had tenderness over her left chest area and limited range of motion of her upper extremities.  She could not lift them above her head due to pain.  Later in the week she did take a cyclobenzaprine from a old prescription she had and this seemed to help a lot as her chest felt looser.  The pain was improved over the weekend but when she woke up this morning she had increased tenderness over her left chest.  Pain is worsened by taking a deep breath or twisting to try to put her seatbelt on.  She does take Celebrex daily already.  She has not been taking any other medications for pain.  Denies any associated shortness of breath or palpitations.  Denies any bruising to the area.  Denies pain elsewhere.      Medical History:   Diagnosis Date    Arthritis     Breast cancer (HCC) 07/2018    left breast    Hormone replacement therapy     Hyperlipidemia     Obstructive sleep apnea last month    Osteoarthritis 06/2013    Personal history of irradiation     Seasonal allergic reaction     Seasonal allergies      Surgical History:   Procedure Laterality Date    LEFT RADIOACTIVE SEED LOCALIZED LUMPECTOMY Left 08/17/2018    Performed by Ignatius Specking, MD at [facility]    IDENTIFICATION SENTINEL LYMPH NODE Left 08/17/2018    Performed by Ignatius Specking, MD at [facility]    INJECTION RADIOACTIVE TRACER FOR SENTINEL NODE IDENTIFICATION Left 08/17/2018    Performed by Ignatius Specking, MD at [facility]    LEFT SENTINEL LYMPH NODE BIOPSY Left 08/17/2018    Performed by Ignatius Specking, MD at [facility] OR COLONOSCOPY DIAGNOSTIC WITH SPECIMEN COLLECTION BY BRUSHING/ WASHING - FLEXIBLE N/A 04/07/2019    Performed by Onnie Boer, MD at [facility] OR    HX ROTATOR CUFF REPAIR      R 08/2010. L 09/2015.    HX WISDOM TEETH EXTRACTION      KNEE ARTHROSCOPY Left     2014    KNEE SURGERY  06/2013    L knee    SHOULDER SURGERY  R rotator 08/2010, L rotator2/2018     Social History     Socioeconomic History    Marital status: Widowed    Number of children: 2   Occupational History    Occupation: retired   Tobacco Use    Smoking status: Never    Smokeless tobacco: Never    Tobacco comments:     briefly in college   Substance and Sexual Activity    Alcohol use: Yes     Alcohol/week: 5.0 standard drinks of alcohol     Types: 5 Glasses of wine per week     Comment: 1 glass of wine/evening  Drug use: Never    Sexual activity: Not Currently     Partners: Male     Birth control/protection: Post-menopausal   Social History Narrative    Here with daughter Alicia Sharp     Social History     Tobacco Use    Smoking status: Never    Smokeless tobacco: Never    Tobacco comments:     briefly in college   Substance Use Topics    Alcohol use: Yes     Alcohol/week: 5.0 standard drinks of alcohol     Types: 5 Glasses of wine per week     Comment: 1 glass of wine/evening     family history includes Anemia in her brother; Arthritis in her mother; Bladder Cancer in her mother; Cancer in her mother; Cancer-Breast in her maternal aunt; Cancer-Prostate in her maternal grandfather; Coronary Artery Disease in her father; Heart Disease in her father; High Cholesterol in her father and mother; Hypertension in her father and mother; Migraines in her maternal grandfather and mother; Stroke in her father and maternal grandmother.  Depression Screening:  PHQ-2: PHQ-2 Score: 0 (10/15/2022  3:31 PM)  PHQ-9: No data recorded  Interventions:  PHQ-2: PHQ-2 Score less than 3: No follow-up or recommendations are necessary at this time (10/15/2022  3:31 PM)  Depression Interventions PHQ-2/9: No data recorded    Review of Systems  Per HPI     Objective:     Medication:   anastrozole (ARIMIDEX) 1 mg tablet TAKE 1 TABLET BY MOUTH once daily    atorvastatin (LIPITOR) 20 mg tablet Take one tablet by mouth daily.    calcium carbonate (TUMS PO) Take  by mouth. Twice a day    celecoxib (CELEBREX) 100 mg capsule Take one capsule by mouth twice daily.    CHOLEcalciferoL (vitamin D3) (VITAMIN D3) 5000 unit tablet Take one tablet by mouth.    cyclobenzaprine (FLEXERIL) 5 mg tablet Take one tablet by mouth at bedtime as needed for Muscle Cramps.    fexofenadine (ALLEGRA) 180 mg tablet Take one tablet by mouth daily.    ONE DAILY MULTIVITAMIN PO        Vitals:    12/09/22 1716   BP: (!) 147/84   BP Source: Arm, Left Upper   Pulse: 59   Resp: 16   SpO2: 96%   PainSc: Two   Weight: 83.9 kg (185 lb)   Height: 162.6 cm (5' 4)       Body mass index is 31.76 kg/m?Marland Kitchen    Physical Exam  Vitals and nursing note reviewed.   Constitutional:       General: She is not in acute distress.     Appearance: Normal appearance.   HENT:      Head: Normocephalic and atraumatic.   Pulmonary:      Effort: No respiratory distress.   Chest:       Neurological:      General: No focal deficit present.      Mental Status: She is alert and oriented to person, place, and time. Mental status is at baseline.   Psychiatric:         Mood and Affect: Mood normal.         Behavior: Behavior normal.          Assessment and Plan:    Left-sided chest pain  -Will have a x-ray completed to rule out rib fracture, discussed if she does have a rib fracture treatment is pain management until it  heals  - Continue Celebrex and add Tylenol 1000 mg up to 3 times a day  - Start cyclobenzaprine (FLEXERIL) 5 mg tablet; Take one tablet by mouth at bedtime as needed for Muscle Cramps.  Dispense: 30 tablet; Refill: 0  - May try over-the-counter lidocaine patches  - Ice as needed  - Reviewed return precautions  - RIBS UNI LEFT W CHEST MIN 3V; Future      Orders Placed This Encounter    RIBS UNI LEFT W CHEST MIN 3V    cyclobenzaprine (FLEXERIL) 5 mg tablet       Total of 30 minutes were spent on the same day of the visit including preparing to see the patient, obtaining and/or reviewing separately obtained history, performing a medically appropriate examination and/or evaluation, counseling and educating the patient/family/caregiver, ordering medications, tests, or procedures, referring and communication with other health care professionals, documenting clinical information in the electronic or other health record, independently interpreting results and communicating results to the patient/family/caregiver, and care coordination. I used a dictation program when conducting this note. Due to this, there may be grammatical errors or inconsistencies. For clarification or questions, please contact me.     Future Appointments   Date Time Provider Department Center   12/18/2022 11:20 AM Zachery Conch, PT [facility]   01/01/2023  1:50 PM Zachery Conch, PT [facility]   01/07/2023  2:45 PM O'Dea, Daneil Dan, MD [facility] [place] [facility]   07/04/2023  8:30 AM Dionisi, Tacey Ruiz, MD [facility]     There are no Patient Instructions on file for this visit.  Tamala Ser, APRN-NP

## 2022-12-09 NOTE — Telephone Encounter
Spoke to patient verified name and DOB,    Relayed the following, Carolee Rota NP will be able to see her tonight at 1730 if she is able to make that time patient was agreeable and will be here at [facility] at [facility], RN  [facility]

## 2022-12-10 ENCOUNTER — Ambulatory Visit: Admit: 2022-12-10 | Discharge: 2022-12-10 | Payer: MEDICARE

## 2022-12-10 ENCOUNTER — Encounter: Admit: 2022-12-10 | Discharge: 2022-12-10 | Payer: MEDICARE

## 2022-12-10 DIAGNOSIS — R079 Chest pain, unspecified: Secondary | ICD-10-CM

## 2022-12-18 ENCOUNTER — Encounter: Admit: 2022-12-18 | Discharge: 2022-12-18 | Payer: MEDICARE

## 2022-12-30 ENCOUNTER — Encounter: Admit: 2022-12-30 | Discharge: 2022-12-30 | Payer: MEDICARE

## 2023-01-01 ENCOUNTER — Encounter: Admit: 2023-01-01 | Discharge: 2023-01-01 | Payer: MEDICARE

## 2023-01-09 ENCOUNTER — Encounter: Admit: 2023-01-09 | Discharge: 2023-01-09 | Payer: MEDICARE

## 2023-01-09 ENCOUNTER — Ambulatory Visit: Admit: 2023-01-09 | Discharge: 2023-01-09 | Payer: MEDICARE

## 2023-01-09 DIAGNOSIS — Z5181 Encounter for therapeutic drug level monitoring: Secondary | ICD-10-CM

## 2023-01-09 LAB — BASIC METABOLIC PANEL
ANION GAP: 9 (ref 3–12)
BLD UREA NITROGEN: 17 mg/dL (ref 7–25)
CALCIUM: 9.7 mg/dL (ref 8.5–10.6)
CHLORIDE: 102 MMOL/L (ref 98–110)
CO2: 27 MMOL/L (ref 21–30)
CREATININE: 0.8 mg/dL (ref 0.4–1.00)
EGFR: >60 mL/min (ref >60)
GLUCOSE,PANEL: 97 mg/dL (ref 70–100)
POTASSIUM: 4 MMOL/L (ref 3.5–5.1)
SODIUM: 138 MMOL/L (ref 137–147)

## 2023-01-14 ENCOUNTER — Encounter: Admit: 2023-01-14 | Discharge: 2023-01-14 | Payer: MEDICARE

## 2023-01-24 ENCOUNTER — Encounter: Admit: 2023-01-24 | Discharge: 2023-01-24 | Payer: MEDICARE

## 2023-01-24 NOTE — Patient Instructions
Thank you for coming to see us today.   Please call our office or send a message through MyChart if you have any questions or concerns.          Dr. Anne O'Dea and Danielle Sistrunk, APRN  Nurses: Michele Kuester, Muaaz Brau and Gina Crimi  Phone: [phone]   Fax: [phone]    [facility] [place] [facility]  [address]  [place], [place] [address]    [facility]  [address]  [place], [place] [address]

## 2023-01-28 ENCOUNTER — Encounter: Admit: 2023-01-28 | Discharge: 2023-01-28 | Payer: MEDICARE

## 2023-01-28 DIAGNOSIS — Z1231 Encounter for screening mammogram for malignant neoplasm of breast: Secondary | ICD-10-CM

## 2023-01-28 DIAGNOSIS — Z79811 Long term (current) use of aromatase inhibitors: Secondary | ICD-10-CM

## 2023-01-28 DIAGNOSIS — C50412 Malignant neoplasm of upper-outer quadrant of left female breast: Secondary | ICD-10-CM

## 2023-01-29 ENCOUNTER — Encounter: Admit: 2023-01-29 | Discharge: 2023-01-29 | Payer: MEDICARE

## 2023-02-12 ENCOUNTER — Encounter: Admit: 2023-02-12 | Discharge: 2023-02-12 | Payer: MEDICARE

## 2023-02-14 ENCOUNTER — Encounter: Admit: 2023-02-14 | Discharge: 2023-02-14 | Payer: MEDICARE

## 2023-02-20 ENCOUNTER — Encounter: Admit: 2023-02-20 | Discharge: 2023-02-20 | Payer: MEDICARE

## 2023-02-20 MED ORDER — ATORVASTATIN 40 MG PO TAB
20 mg | ORAL_TABLET | Freq: Every day | ORAL | 1 refills | Status: AC
Start: 2023-02-20 — End: ?

## 2023-02-20 NOTE — Telephone Encounter
Some refill protocol elements NOT met    Medication Name: atorvastatin  Medication Strength: 40 mg   Medication Frequency: take 1/2 tablet by mouth every day    Last Fill Date: 12/12/2021    Requested Prescriptions   Pending Prescriptions Disp Refills    atorvastatin (LIPITOR) 40 mg tablet [Pharmacy Med Name: atorvastatin 40 mg tablet] 45 tablet 1     Sig: TAKE 1/2 TABLET BY MOUTH EVERY DAY       Cardiovascular: Antilipids - Statin 2 Passed - 02/20/2023 12:03 PM        Passed - Valid encounter within last 12 months     Recent Visits  Date Type Provider Dept   12/09/22 Office Visit Murlean Iba, APRN-NP [facility]   10/07/22 Office Visit Telehealth Steward Ros, APRN-NP [facility]   07/03/22 Office Visit Dionisi, Tacey Ruiz, MD [facility]   06/06/22 Office Visit Flanagin, Clide Dales, APRN-NP [facility]   02/08/22 Office Visit Telehealth Altenhofen, Rosina Lowenstein, MD [facility]   10/31/21 Office Visit Steward Ros, APRN-NP [facility]   07/02/21 Office Visit Dionisi, Tacey Ruiz, MD [facility]   04/06/21 Office Visit Steward Ros, APRN-NP [facility]   03/19/21 Office Visit Flanagin, Clide Dales, APRN-NP [facility]   Showing recent visits within past 720 days and meeting all other requirements  Future Appointments  Date Type Provider Dept   07/04/23 Appointment Dionisi, Tacey Ruiz, MD [facility]   Showing future appointments within next 360 days and meeting all other requirements            Passed - HDL within 360 days     HDL   Date Value Ref Range Status   07/08/2022 59 >40 MG/DL Final   16/05/9603 57 >54 MG/DL Final              Passed - LDL within 360 days     LDL   Date Value Ref Range Status   07/08/2022 93 <100 mg/dL Final   09/81/1914 79 <782 mg/dL Final              Passed - Total Cholesterol within 360 days     Cholesterol   Date Value Ref Range Status   07/08/2022 167 <200 MG/DL Final   95/62/1308 657 <200 MG/DL Final Passed - Triglycerides within 360 days     Triglycerides   Date Value Ref Range Status   07/08/2022 82 <150 MG/DL Final   84/69/6295 58 <284 MG/DL Final                     Provided refill: 1 fill with 1 refill    Raelene Bott, RN  [facility]

## 2023-02-21 ENCOUNTER — Encounter: Admit: 2023-02-21 | Discharge: 2023-02-21 | Payer: MEDICARE

## 2023-02-21 DIAGNOSIS — G4733 Obstructive sleep apnea (adult) (pediatric): Secondary | ICD-10-CM

## 2023-02-21 DIAGNOSIS — E785 Hyperlipidemia, unspecified: Secondary | ICD-10-CM

## 2023-02-21 DIAGNOSIS — Z79890 Hormone replacement therapy (postmenopausal): Secondary | ICD-10-CM

## 2023-02-21 DIAGNOSIS — C50919 Malignant neoplasm of unspecified site of unspecified female breast: Secondary | ICD-10-CM

## 2023-02-21 DIAGNOSIS — M1990 Unspecified osteoarthritis, unspecified site: Secondary | ICD-10-CM

## 2023-02-21 DIAGNOSIS — E669 Obesity, unspecified: Secondary | ICD-10-CM

## 2023-02-21 DIAGNOSIS — Z923 Personal history of irradiation: Secondary | ICD-10-CM

## 2023-02-21 DIAGNOSIS — T7840XA Allergy, unspecified, initial encounter: Secondary | ICD-10-CM

## 2023-02-21 DIAGNOSIS — J302 Other seasonal allergic rhinitis: Secondary | ICD-10-CM

## 2023-02-21 MED ORDER — METFORMIN 500 MG PO TB24
ORAL_TABLET | ORAL | 0 refills | Status: AC
Start: 2023-02-21 — End: ?

## 2023-02-21 NOTE — Progress Notes
[facility]  Initial Visit    Date of Service: 02/21/2023  PCP: Alicia Sharp  DOB: 02/01/1956  MRN#: [id number]    Alicia Sharp is a 67 y.o. female.        History of Present Illness  Alicia Sharp is a 68 y.o. female with  has a past medical history of Allergy, Arthritis, Breast cancer (HCC) (07/2018), Hormone replacement therapy, Hyperlipidemia, Obesity, Obstructive sleep apnea (last month), Osteoarthritis (06/2013), Personal history of irradiation, Seasonal allergic reaction, and Seasonal allergies. presenting for an initial discussion regarding obesity and weight management.    Weight history/ previous weight loss attempts:    Alicia Sharp says that she's always felt heavier and describes herself as being the chubby kid. As an adult she was able to lose weight following [employer]. She's also participated in [employer] watchers a couple of times. The most recent success that she had with weight loss was when she utilized [employer].     Alicia Sharp identifies a component of emotional eating, eating when she's sad, frustrated, angry or through celebrations. She's been working on intuitive eating and exercising portion control.     Alicia Sharp has taken Phentermine in the past, upon record review she didn't have side effects but also didn't have notable weight loss        02/14/2023     8:56 AM   [facility] Management [place]    What are your goals for this visit? explore options   List a specific weight goal if you have one: 150   List your occupation: retired   Based on your daily routine and work schedule, on average, how many hours do you spend sitting in one day? 8   Do you currently engage in any physical activity? Yes   If yes, how many days per week? 6   List every type of activity or movement included in your lifestyle and the amount of time in a day spent engaging in that activity: strength training 3 days/wk; goal of 10K steps/day   How many meals per week do you eat not prepared at home (restaurants, fast food, take out, etc.)? 5   Morning Meal (include portion sizes):  [employer] avocado toast, 2 slices   Morning Beverages (include size in cups/ounces):  20 oz. latte; 10 oz. water   Noon Meal (include portion sizes):  [employer] cheese curds   Noon Beverages (include size in cups/ounces):  16 oz. soda   Afternoon Beverages (include size in cups/ounces):  20 oz. water   Evening Meal (include portion sizes):  2/3 [employer] bacon cheeseburger   Evening Beverages (include size in cups/ounces):  12 oz. water   Evening Snack (include portion sizes): ice cream, 2 scoops   Additional Evening Beverages (include size in cups/ounces):  4 oz. port wine            02/14/2023     8:58 AM   WM BED-7 QUESTIONNAIRE   1. During the last 3 months, did you have any episode of excessive overeating (i.e., eating significantly more than what most people would eat in a similar period of time)? No   3. During your episodes of excessive overeating, how often do you feel like you had no control over your eating (e.g., not being able to stop eating, feel compelled to eat, or going back and forth for more food)?  Never or rarely   4. During your episodes of excessive overeating, how often do you continue to eat even  though you were not hungry? Sometimes   5. During your episodes of excessive overeating, how often were you embarrassed by how much you ate? Never or rarely   6. During your episode of excessive overeating, how often do you feel disgusted with yourself or guilty afterwards? Sometimes   7. During the last 3 months, how often do you make yourself vomit as a means to control your weight or shape? Never or rarely           Past Medical History -  has a past medical history of Allergy, Arthritis, Breast cancer (HCC) (07/2018), Hormone replacement therapy, Hyperlipidemia, Obesity, Obstructive sleep apnea (last month), Osteoarthritis (06/2013), Personal history of irradiation, Seasonal allergic reaction, and Seasonal allergies.     Past Surgical History -  has a past surgical history that includes rotator cuff repair; knee arthroscopy (Left); wisdom teeth extraction; Mastectomy, partial (Left, 08/17/2018); lymph node biopsy (Left, 08/17/2018); Colonoscopy (N/A, 04/07/2019); shoulder surgery (R rotator 08/2010, L rotator2/2018); and knee surgery (06/2013).     Social history - she is a retired Optician, dispensing     Family History   Problem Relation Name Age of Onset    Migraines Mother Alicia Sharp     Bladder Cancer Mother Alicia Sharp     High Cholesterol Mother Alicia Sharp     Hypertension Mother Alicia Sharp     Cancer Mother Alicia Sharp         bladder    Arthritis Mother Alicia Sharp     Heart Disease Father Alicia Sharp     Stroke Father Alicia Sharp     Coronary Artery Disease Father Alicia Sharp     High Cholesterol Father Alicia Sharp     Hypertension Father Alicia Sharp     Cancer-Breast Maternal Aunt Alicia Sharp         30s    Cancer-Prostate Maternal Grandfather Alicia Sharp     Migraines Maternal Alicia Sharp     Stroke Maternal Grandmother Alicia Sharp     Anemia Brother Alicia Sharp         Review of Systems   Constitution: Positive for weight gain.      Objective:     Physical Exam  Constitutional: Well developed, well nourished, no distress   HENT: Normocephalic, atraumatic  Respiratory: Normal effort, no respiratory distress  Neurologic: Alert  Psych: mood /affect normal. Behavior normal. Thought content normal. Judgement normal.    Medications-   anastrozole (ARIMIDEX) 1 mg tablet TAKE 1 TABLET BY MOUTH once daily    atorvastatin (LIPITOR) 40 mg tablet TAKE 1/2 TABLET BY MOUTH EVERY DAY    calcium carbonate (TUMS PO) Take  by mouth. Twice a day    CHOLEcalciferoL (vitamin D3) (VITAMIN D3) 5000 unit tablet Take one tablet by mouth.    fexofenadine (ALLEGRA) 180 mg tablet Take one tablet by mouth daily.    meloxicam (MOBIC) 7.5 mg tablet Take one tablet by mouth daily.    ONE DAILY MULTIVITAMIN PO Recent Labs:  Basic Metabolic Profile    Lab Results   Component Value Date/Time    NA 138 01/09/2023 08:32 AM    K 4.0 01/09/2023 08:32 AM    CA 9.7 01/09/2023 08:32 AM    CL 102 01/09/2023 08:32 AM    CO2 27 01/09/2023 08:32 AM    GAP 9 01/09/2023 08:32 AM    Lab Results   Component Value Date/Time    BUN 17 01/09/2023 08:32 AM    CR 0.81  01/09/2023 08:32 AM    GLU 97 01/09/2023 08:32 AM        Hepatic Function    Lab Results   Component Value Date/Time    ALBUMIN 4.4 07/08/2022 02:20 PM    TOTPROT 7.3 07/08/2022 02:20 PM    ALKPHOS 71 07/08/2022 02:20 PM    Lab Results   Component Value Date/Time    AST 21 07/08/2022 02:20 PM    ALT 17 07/08/2022 02:20 PM    TOTBILI 0.4 07/08/2022 02:20 PM        TSH   Lab Results   Component Value Date/Time    TSH 1.14 12/25/2020 09:14 AM      Lab Draw:  No results found for: HGBA1C  POC:  Lab Results   Component Value Date/Time    A1C 5.3 10/23/2020 12:00 AM    A1C 5.3 01/18/2019 12:00 AM      Lab Results   Component Value Date    CHOL 167 07/08/2022    TRIG 82 07/08/2022    HDL 59 07/08/2022    LDL 93 07/08/2022    VLDL 16 07/08/2022    NONHDLCHOL 108 07/08/2022      Wt Readings from Last 5 Encounters:   01/28/23 85.7 kg (189 lb)   12/09/22 83.9 kg (185 lb)   10/07/22 82.6 kg (182 lb)   08/20/22 84.6 kg (186 lb 8.2 oz)   08/01/22 84.6 kg (186 lb 9.6 oz)     BP Readings from Last 3 Encounters:   01/28/23 128/59   12/09/22 (!) 147/84   08/01/22 (!) 148/80      Telehealth Patient Reported Vitals       Row Name 02/21/23 1234                Weight: 85 kg (187 lb 6.4 oz)        Height: 162.6 cm (5' 4)        Pain Score: Zero                            Assessment and Plan:  Obesity-   Initial weight & BMI:  (02/21/23)  Today's weight: 187 #  Telehealth Body Mass Index: 32.17 at 02/21/2023 12:51 PM    Treatment:  Plan/ level of monitoring: Nutrition Counseling and Pharmacotherapy We will also refer her to our [facility] pilot group     Nutrition Goals - We will get the patient set up to meet with one of our dieticians.   Activity Goals - continue her current exercise regimen which incorporates both cardio and strength training.     Medication Recommendations- We briefly discussed Wegovy, however she does not have cardiac risk factors for which Medicare would provide coverage. We could consider trying Phentermine again and will further discuss this at the next visit. In the meantime, we will start Metformin to see if this might provide support. Will plan to start Metformin XR 500 mg daily x 1 week and then increase to 1000 mg if tolerated x1 week and then ultimately to 1500 mg thereafter . We did discuss that if she had side effects we can titrate up more slowly.     Discussion of safety/side effects - reviewed common GI side effects associated with Metformin.     Obesity Management goals: reviewed nausea, vomiting, diarrhea, constipation, abdominal pain can occur with GLP-1 agonists/incretin, but usually improve with time. Discussed contraindication with history of pancreatitis and personal  or family history of medullary thyroid cancer.     Further obesity management recommendations:   Annual screen for a assessment of Type II diabetes, HTN, Hyperlipidemia, Depression, OSA, NAFLD, Vitamin D deficiency and renal disease.   Given the association of obesity with cancer, all age appropriate cancer screenings should be UTD  Medication Review: If possible medication that promote weight gain should be avoided and medication that are weight-neutral or promote weight loss should be considered.     Comorbid Conditions:  HLD  H/o breast cancer  OSA    Total Time Today was 60 minutes in the following activities: Preparing to see the patient, Obtaining and/or reviewing separately obtained history, Performing a medically appropriate examination and/or evaluation, Counseling and educating the patient/family/caregiver, Ordering medications, tests, or procedures, and Documenting clinical information in the electronic or other health record      Future Appointments   Date Time Provider Department Center   07/04/2023  8:30 AM Dionisi, Tacey Ruiz, MD [facility]   07/22/2023  9:30 AM [id number]   07/22/2023 10:00 AM Floydene Flock, APRN-NP [facility] [place] Ex[facility]   09/11/2023  3:00 PM [facility]

## 2023-02-24 ENCOUNTER — Encounter: Admit: 2023-02-24 | Discharge: 2023-02-24 | Payer: MEDICARE

## 2023-03-10 ENCOUNTER — Ambulatory Visit: Admit: 2023-03-10 | Discharge: 2023-03-10 | Payer: MEDICARE

## 2023-03-10 DIAGNOSIS — J302 Other seasonal allergic rhinitis: Secondary | ICD-10-CM

## 2023-03-10 DIAGNOSIS — R051 Acute cough: Secondary | ICD-10-CM

## 2023-03-10 DIAGNOSIS — B349 Viral infection, unspecified: Secondary | ICD-10-CM

## 2023-03-10 DIAGNOSIS — R509 Fever, unspecified: Secondary | ICD-10-CM

## 2023-03-10 DIAGNOSIS — R0981 Nasal congestion: Secondary | ICD-10-CM

## 2023-03-10 MED ORDER — FLUTICASONE PROPIONATE 50 MCG/ACTUATION NA SPSN
2 | Freq: Two times a day (BID) | NASAL | 3 refills | 60.00000 days | Status: AC
Start: 2023-03-10 — End: ?

## 2023-03-10 MED ORDER — BENZONATATE 200 MG PO CAP
200 mg | ORAL_CAPSULE | Freq: Three times a day (TID) | ORAL | 0 refills | 9.00000 days | Status: AC | PRN
Start: 2023-03-10 — End: ?

## 2023-03-10 NOTE — Progress Notes
Telehealth Visit Note    Date of Service: 03/10/2023    Subjective:           Alicia Sharp is a 67 y.o. female.    History of Present Illness  Patient comes in telehealth this morning because she has developed a cough, fever, chill and sweats since Saturday. The fever is controled with Tylenol. She has a history of seasonal allergies and she is taking Allegra and PSE for her symptoms. She is not taking any Flonase. She has no history of Asthma and is not experiencing any shortness of breath. Her home COVID test was negative this AM.  She does not work outside of the home so she is staying home and resting.                Objective:          anastrozole (ARIMIDEX) 1 mg tablet TAKE 1 TABLET BY MOUTH once daily    atorvastatin (LIPITOR) 40 mg tablet TAKE 1/2 TABLET BY MOUTH EVERY DAY    benzonatate (TESSALON) 200 mg capsule Take one capsule by mouth three times daily as needed for Cough for up to 30 doses.    calcium carbonate (TUMS PO) Take  by mouth. Twice a day    CHOLEcalciferoL (vitamin D3) (VITAMIN D3) 5000 unit tablet Take one tablet by mouth.    fexofenadine (ALLEGRA) 180 mg tablet Take one tablet by mouth daily.    fluticasone propionate (FLONASE ALLERGY RELIEF) 50 mcg/actuation nasal spray, suspension Apply two sprays to each nostril as directed twice daily.    meloxicam (MOBIC) 7.5 mg tablet Take one tablet by mouth daily.    metFORMIN-XR (GLUCOPHAGE XR) 500 mg extended release tablet Take one tablet by mouth daily with dinner for 7 days, THEN two tablets daily with dinner for 7 days, THEN three tablets daily with dinner for 35 days.    ONE DAILY MULTIVITAMIN PO            Telehealth Body Mass Index: 32.17 at 03/10/2023  9:05 AM    Physical Exam  Constitutional:       Appearance: Normal appearance.   HENT:      Head: Normocephalic and atraumatic.   Eyes:      Conjunctiva/sclera: Conjunctivae normal.   Pulmonary:      Effort: Pulmonary effort is normal.   Neurological:      Mental Status: She is alert and oriented to person, place, and time.   Psychiatric:         Mood and Affect: Mood normal.         Behavior: Behavior normal.         Thought Content: Thought content normal.              Assessment and Plan:  Diagnoses and all orders for this visit:    Viral syndrome    Fever, unspecified fever cause    Acute cough  -     benzonatate (TESSALON) 200 mg capsule; Take one capsule by mouth three times daily as needed for Cough for up to 30 doses.    Seasonal allergies  -     fluticasone propionate (FLONASE ALLERGY RELIEF) 50 mcg/actuation nasal spray, suspension; Apply two sprays to each nostril as directed twice daily.    Nasal congestion  -     fluticasone propionate (FLONASE ALLERGY RELIEF) 50 mcg/actuation nasal spray, suspension; Apply two sprays to each nostril as directed twice daily.     Patient is  going to stay home rest and drink plenty of fluids. She understands if her fever is still present in 48 hours she needs to go to in person urgent care. If symptoms worsen she can go to in person urgent care sooner. Given that this is most likely a viral illness symptomatic treatment will improve symptoms.         Clinical References      Febrile Illness with Uncertain Cause (Adult)   You have a fever, but the cause is unknown. A fever is the body's natural reaction to an illness such as an infection due to a virus or bacteria. Sometimes other conditions such as cancer or immune diseases can cause fever. This might be more likely if your fever has lasted for more than a week or 2. In most cases, the higher temperature itself isn't harmful. It actually helps the body fight infections. A fever doesn't need to be treated unless you feel very uncomfortable.   Sometimes a fever can be an early sign of a serious infection or other condition. So follow up if your condition doesn't get better or starts to get worse.   Home care  Unless given other instructions by your healthcare provider, follow these guidelines when caring for yourself at home.   General care  If your symptoms are not severe, rest at home for the first 2 to 3 days. When you are active again, don't let yourself get too tired.  For your overall health, don't smoke. Also stay away from secondhand smoke.  You may not feel like eating too much. So a light diet is fine. Stay hydrated by drinking 6 to 8 glasses of fluids per day. This includes water, soft drinks, sports drinks, juices, tea, or soup. If you have congestion, extra fluids will help loosen secretions in the nose and lungs.  Medicines  You can take acetaminophen or ibuprofen for pain or to lower your temperature, unless you were given a different medicine to use. If you have chronic liver or kidney disease or have ever had a stomach ulcer or gastrointestinal bleeding, talk with your healthcare provider before using these medicines. Also talk with your provider if you are taking medicine to prevent blood clots. Don't give aspirin to anyone younger than age 50 unless directed by the provider. It may cause a serious illness called Reye syndrome. This most often affects the brain and liver.  If you were given antibiotics for an infection, take them until they are used up, or your healthcare provider tells you to stop. It's important to finish the antibiotics even if you feel better. This is to make sure the infection has cleared. Antibiotics are not often given for a viral infection or a fever with an unknown cause.  Over-the-counter medicines will not shorten the length of the illness. But they may help with symptoms. These include cough, sore throat, or nasal and sinus congestion. Ask your pharmacist for product suggestions. Don't use decongestants if you have high blood pressure.     Follow-up care  Follow up with your healthcare provider, or as advised.  If a culture or other lab tests were done, you will be told if your treatment needs to be changed. Call your healthcare provider as directed for the results.  If X-rays, a CT scan, an MRI scan, or an ultrasound were done, a specialist will review them. You will be told of any findings that may affect your care. Call your healthcare provider as directed for the  results.  Call 911  Call 911 if any of these occur:   Trouble breathing or swallowing, or wheezing  Chest pain  Confusion  Extreme drowsiness or trouble waking up  Fainting or loss of consciousness  Fast heart rate  Low blood pressure  Vomiting blood, or large amounts of blood in stool  Seizure  When to get medical advice  Call your healthcare provider right away if any of these occur:   Cough with lots of colored sputum (mucus)  Blood in your sputum  Severe headache  Face, neck, throat, or ear pain  Feeling drowsy  Belly pain  Repeated vomiting or diarrhea; bloody diarrhea  Joint pain or a new rash  Burning when urinating  Fever of 100.4?F (38?C) or higher, or as directed by your provider  Shaking chills  Feeling weak or dizzy  StayWell last reviewed this educational content on 01/10/2021  ? 2000-2024 [employer], [employer]. All rights reserved. This information is not intended as a substitute for professional medical care. Always follow your healthcare professional's instructions.            Understanding the Cold Virus  Colds are one of the most common illnesses that people get. Most adults get 2 or 3 colds per year, and most children get even more. Colds may be caused by over 200 types of viruses. The most common of these are rhinoviruses (?rhino? refers to the nose).   What causes a cold virus?  All colds start with infection by a virus. You can be infected by more than one cold virus at a time. Colds and flu are respiratory illnesses, but they are caused by different viruses. Infection with cold viruses happens when:   You breathe in a virus from the air. This can happen when someone with a cold sneezes or coughs near you.  You touch your eyes, nose, or mouth when your hand has a cold virus on it. This can happen if you touch an object that has the cold virus on it.  What are the symptoms of a cold virus?  You may wonder if you have a cold or the flu. Compared to the flu, cold symptoms come on more gradually. Almost all colds cause a stuffy nose. Other common cold symptoms include:   Runny nose  Sneezing  Sore throat  Cough  Fatigue (sometimes)  Fever (rare)  Headache (rare)  How is a cold treated?  Colds usually last 7 to 10 days. Treatment focuses on relieving symptoms. Treatments may include:   Decongestant medicines. Several types of decongestants are available without prescription. These may help reduce stuffy or runny nose symptoms.  Prescription or over-the-counter nasal sprays. These may help reduce nasal symptoms, including stuffiness.  Over-the-counter (OTC) pain medicines. These can help with headaches and sore throat.  Self-care. This includes extra rest, using humidifiers, and drinking more fluids. These help you feel better while you are getting over a cold.  Because viruses cause colds, antibiotics don't help. They don't make a cold shorter or relieve symptoms. Taking antibiotics when you don?t need them can make them work less well when you need them for another illness.   Follow all directions for using medicines, especially when giving them to children. Contact your healthcare provider if you have any questions about using cold medicines safely. Before buying cold medicines, make a list of any prescription medicines you take and ask the pharmacist what OTC cold medicines are safe for you to use.  Can a cold be prevented?  You can help reduce the spread of cold viruses. This can help both you and others avoid getting colds. Follow these tips:   Wash your hands often with soap and clean running water. Wash your hands for at least 20 seconds. When you can?t wash with soap and water, use a hand sanitizer that contains at least 60% alcohol.  Don?t touch your nose, eyes, or mouth, especially after touching something that may have a cold virus on it.  Cover your mouth and nose when you cough or sneeze. Throw away tissues after using them and wash your hands.  Disinfect things you touch often, such as phones and keyboards.    Stay home when you have a cold.  Consider wearing a mask with 2 or more layers of washable, breathable fabric. A mask may lower your risk of getting respiratory viruses of many kinds. Make sure the mask fits snugly against your face and completely covers your nose and mouth and Use this when near others, to prevent COVID-19 and other respiratory viruses.     Washing hands often helps reduce the risk of spreading cold viruses.      What are possible complications of a cold virus?   Colds usually go away by themselves. But you may get another type of infection while you have a cold. These can include:   Sinus infection  Lung infection, such as bronchitis or pneumonia  Ear infection  If you have asthma or chronic bronchitis, a cold can make your condition worse.  When should I call my healthcare provider?  Call your healthcare provider or seek medical care right away if you have any of these:   Fever of 100.4?F (38?C) or higher, or as directed by your healthcare provider  Cough, chest pain, or shortness of breath that gets worse  Symptoms don?t get better or get worse after about 10 days  Headache, sleepiness, or confusion that gets worse  StayWell last reviewed this educational content on 08/12/2020  ? 2000-2024 [employer], [place]. All rights reserved. This information is not intended as a substitute for professional medical care. Always follow your healthcare professional's instructions.              Viral Syndrome (Adult)  A viral illness may cause many symptoms such as fever. Other symptoms depend on the part of the body that the virus affects. If it settles in your nose, throat, and lungs, it may cause cough, sore throat, congestion, runny nose, headache, earache and other ear symptoms, or shortness of breath. If it settles in your stomach and intestinal tract, it may cause nausea, vomiting, cramping, and diarrhea. Sometimes it causes generalized symptoms like aching all over, feeling tired, loss of energy, or loss of appetite.   A viral illness often lasts anywhere from a few days to a few weeks. But sometimes it lasts longer. In some cases, a more serious infection can look like a viral syndrome in the first few days of the illness. You may need another exam and additional tests to know the difference. Watch for the warning signs listed below for when to get medical advice.   Home care  Follow these guidelines for taking care of yourself at home:  If symptoms are severe, rest at home for the first 2 to 3 days.  Stay away from cigarette smoke - both your smoke and the smoke from others.  You may use over-the-counter acetaminophen or ibuprofen for fever, muscle  aching, and headache, unless another medicine was prescribed for this. Antibiotics aren't used to treat viral infections. If you have chronic liver or kidney disease or ever had a stomach ulcer or gastrointestinal bleeding, talk with your healthcare provider before using these medicines. No one who is younger than 72 and ill with a fever should take aspirin. It may cause severe disease or death.  Your appetite may be poor, so a light diet is fine. Prevent dehydration by drinking 8 to 12, 8-ounce glasses of fluids each day. This may include water; orange juice; lemonade; apple, grape, and cranberry juice; clear fruit drinks; electrolyte replacement and sports drinks; and decaffeinated teas and coffee. If you've been diagnosed with a kidney disease, ask your healthcare provider how much and what types of fluids you should drink to prevent dehydration. If you have kidney disease, drinking too much fluid can cause it build up in your body and be dangerous to your health.  Over-the-counter remedies won't shorten the length of the illness. But they may be helpful for symptoms such as cough, sore throat, nasal and sinus congestion, or diarrhea. Don't use decongestants if you have high blood pressure.  Follow-up care  Follow up with your healthcare provider if you don't get better over the next week.   Call 911  Call 911 if any of these occur:   Convulsion  Feeling weak, dizzy, or like you are going to faint  Chest pain, or more than mild shortness of breath  When to get medical advice  Call your healthcare provider right away if any of these occur:  Cough with lots of colored sputum (mucus) or blood in your sputum  Chest pain, shortness of breath, wheezing, or trouble breathing  Severe headache; face, neck, or ear pain  Severe, constant pain in the lower right side of your belly (abdominal)  Continued vomiting (can?t keep liquids down)  Frequent diarrhea (more than 5 times a day), or blood (red or black color) or mucus in diarrhea  Feeling weak, dizzy, or like you are going to faint  Extreme thirst  Fever of 100.4?F (38?C) or higher, or as directed by your provider  StayWell last reviewed this educational content on 08/12/2020  ? 2000-2024 [employer], [place]. All rights reserved. This information is not intended as a substitute for professional medical care. Always follow your healthcare professional's instructions.            Seasonal Allergy  Seasonal allergy is also called hay fever. An allergic reaction may happen after a person is exposed to pollen. Pollen comes from grasses, weeds, trees, and shrubs. Seasonal allergies are common during the spring, summer, or fall. Pollens contact the lining of the nose, eyes, eyelids, sinuses, throat, and lungs. This causes histamine and other chemicals to be released from the tissues. Histamine causes itching and swelling. This may produce a watery discharge from the eyes or nose. More severe symptoms such as sneezing, nasal congestion, postnasal drip, itchy eyes, nose, throat, and mouth, scratchy throat, dry cough, or wheezing may also occur.   Home care  To make your seasonal allergy symptoms better:   Stay away from or limit your time near the allergen as much as you can:    Stay indoors on windy days during pollen season. Check pollen counts in your area. These can be found in weather reports.  Keep windows and doors closed. Use air conditioning instead in your home and car. This filters the air.  Change air-conditioner filters  often.  Take a shower, wash your hair, and change clothes after being outdoors.  Wear a mask when working outdoors. Cloth or surgical masks can decrease allergy symptoms. A NIOSH-rated 95 filter mask (N95 mask) will filter even more pollen.  Before going outside, take your allergy medicine as advised by your healthcare provider.  Decongestant pills and sprays reduce tissue swelling and watery discharge. They are available over the counter. Using nasal decongestant sprays too much may make symptoms worse. Don't use these more often than recommended. Sometimes symptoms can worsen when stopping them. Talk with your healthcare provider or pharmacist about these medicines before taking them, especially if you have high blood pressure or heart problems.   Antihistamines block the release of histamine during the allergic response. They may work better when taken before symptoms develop. Unless you were prescribed an antihistamine, you can take over-the-counter antihistamines. Some antihistamines can make you sleepy. Talk with your healthcare provider or pharmacist about these medicines before taking them, especially if you have glaucoma, high blood pressure, heart disease, trouble urinating, breathing problems, thyroid disease, liver disease, or kidney disease.  Steroid nasal sprays help reduce tissue swelling. Steroid nasal sprays are generally well-tolerated and are available over the counter. Ask your pharmacist or healthcare provider for suggestions. You may be prescribed oral steroids for more severe symptoms.  If you have asthma, pollen season may make your asthma symptoms worse. It's important that you use your asthma medicines as directed during this time to prevent or treat attacks. Some people's asthma symptoms get worse when they take antihistamines. This is due to their drying effect on the lungs. If you notice this, stop the antihistamines, drink extra fluids, and call your healthcare provider.  If you have sinus congestion or drainage, a saline nasal rinse may help. It can lessen swelling and clear mucus. This allows sinuses to drain. Prepackaged kits are sold at most drug stores.  Follow-up care  Follow up with your healthcare provider, or as advised. If you have been referred to a specialist, make an appointment soon. An allergy test can help you find out what to stay away from and when to take your allergy medicines. Also, allergy immunotherapy can help ease or prevent seasonal allergy symptoms. Allergy shots are given by a provider who specializes in treating allergies (allergist). The shots may also lower the amount of medicine you need to take during the allergy season. Sublingual immunotherapy (SLIT) allergy tablets, which are given under the tongue, are also available. Talk with your provider to see what treatments might help you.   When to get medical advice  Call your healthcare provider right away for any of the following:   Face, ear, or sinus pain; colored drainage from the nose  Headaches  You have asthma and your asthma symptoms don't respond to the usual doses of your medicine  Cough with colored mucus  Fever of 100.4?F (38?C) or higher, or as advised by the provider  Call 911  Call 911 if any of these occur:   Trouble breathing or swallowing, wheezing  Hoarse voice, trouble speaking, or drooling  Confusion  Very drowsy or trouble awakening  Fainting or loss of consciousness  Rapid heart rate or weak pulse  Low blood pressure  Feeling of doom  Nausea, vomiting, abdominal pain, diarrhea  Vomiting blood or large amounts of blood in stool  Seizure  Cold, moist, or pale (blue in color) skin  StayWell last reviewed this educational content on 12/10/2020  ?  2000-2024 [employer], [place]. All rights reserved. This information is not intended as a substitute for professional medical care. Always follow your healthcare professional's instructions.                            10 minutes spent on this patient's encounter with counseling and coordination of care taking >50% of the visit.

## 2023-03-11 ENCOUNTER — Encounter: Admit: 2023-03-11 | Discharge: 2023-03-11 | Payer: MEDICARE

## 2023-03-22 ENCOUNTER — Encounter: Admit: 2023-03-22 | Discharge: 2023-03-22 | Payer: MEDICARE

## 2023-03-24 ENCOUNTER — Encounter: Admit: 2023-03-24 | Discharge: 2023-03-24 | Payer: MEDICARE

## 2023-03-24 ENCOUNTER — Ambulatory Visit: Admit: 2023-03-24 | Discharge: 2023-03-24 | Payer: MEDICARE

## 2023-03-24 DIAGNOSIS — R062 Wheezing: Secondary | ICD-10-CM

## 2023-03-24 DIAGNOSIS — E669 Obesity, unspecified: Secondary | ICD-10-CM

## 2023-03-24 MED ORDER — ALBUTEROL SULFATE 90 MCG/ACTUATION IN HFAA
2 | RESPIRATORY_TRACT | 0 refills | Status: AC | PRN
Start: 2023-03-24 — End: ?

## 2023-03-24 NOTE — Patient Instructions
1. Increase self-awareness and accountability through self-monitoring.    - IBH workshop next month  - Follow up with RD for support     2. Consume a minimum of 5 cups of fruits/vegetables daily.   Focus on getting at least half of foods as fruits and non-starchy vegetables. Whole grains, lean meats/eggs, some dairy, nuts and seeds made up the remainder of the diet. Foods both high in calories and low in nutritive quality are consumed rarely (baked goods, fried foods, high fat/processed meats, sweets).    Specifically discussed today:  - start day with high protein breakfast -- see handout   - let's opt for lower calorie breakfast options at [facility] that we discussed today    3. Progress to meet recommendation for 150+ minutes of physical activity for general health, while increasing to 300+ minutes weekly for continued weight loss or increased success for weight maintenance.   - continue strength training + dragon boating    4. Increase activities of daily living and reduce sedentary time.    Step goal: 10,000 steps/day

## 2023-03-24 NOTE — Progress Notes
Primary goal of visit: Initial nutrition counseling visit for Intensive Behavioral Therapy for Weight Management    Nutrition Assessment of Patient:    Ht Readings from Last 1 Encounters:   12/09/22 162.6 cm (5' 4)       Wt Readings from Last 5 Encounters:   03/24/23 83.9 kg (185 lb)   01/28/23 85.7 kg (189 lb)   12/09/22 83.9 kg (185 lb)   10/07/22 82.6 kg (182 lb)   08/20/22 84.6 kg (186 lb 8.2 oz)    *pt reported weight    BMI: Body mass index is 31.76 kg/m?Marland Kitchen  BMI Categories Adult: class I obese   Total Energy Expenditure (based on Mifflin equation x activity factor): 1370 x 1.375 = 1870 kcal  Total Calorie Prescription for weight loss: 1200-1370 kcal  Needs to promote: weight loss and maintenance  6 months: 09/24/23    PMH:  Past Medical History:   Diagnosis Date    Allergy     seasonal    Arthritis     Breast cancer (HCC) 07/2018    left breast    Hormone replacement therapy     Hyperlipidemia     Obesity     Obstructive sleep apnea last month    Osteoarthritis 06/2013    Personal history of irradiation     Seasonal allergic reaction     Seasonal allergies          Notes:     Motivation/goals - wants some accountability, feels she knows what she needs to do, just needs to do it    Previous diets - WW, Nutrisystem, Noom -- gets caught up on little things that she loses sight of big picture.    Food intolerances/restrictions - none; does not like liver, cooked carrots, beets    Recently been working on intuitive eating + reading through the book.   Notices she tends to overeat when at home. Was part of the clean plate club growing up so feels the need to eat all of her food.     Starting [facility] workshop.     Diet:      Meal 1 Snack Meal 2 Snack Meal 3 Snack   Time          Coffee + milk    On Sundays gets a latte at coffee shop      Toast + PB    Yogurt (plain, might add honey or vanilla) + granola + fruit    Might grab [employer] - latte with bacon egg bites or bacon gudda sandwich  Might do salad   Might just have a source of protein + no veggie        Might add veggies in      Last night: popcorn + PB drizzled        2 scoops ice cream (chocolate PB) - likes to have protein here  before bed       Meal skipping: tries not to   Night eating:     Drinks: coffee, water (has a water app to track, avg 60 oz; might do [employer]), gets a soda when out for burgers or pizza   - latte: typically gets plain with just steamed milk, size typically medium to large    - etoh: 1 drink per sitting  Snacks: ice cream, cheetos (eats from bag); loves sweets  Fruits/Vegetables: likes these but gets a little picky with them  Meal Plan/Prep: more travel this summer so less cooking at  home; does best with preparing meals for the week   - cooks for 4 (lives alone) & will eat + freeze  Eating Out: 3-4x/week; rarely eats entire meal & has leftovers with it   - less desirable choices when with kids/grandkids - more burgers/fries + [employer]   - Wednesday night with knitting group: house salad + chicken or shrimp  Weekends:   - Sunday coffee shop + BF out   - large noon meal on Sunday    - snack for dinner on Sundays   Tracking: has done it past but becomes too much for her; feels she could do better with macros    Activity:  Planned Exercise:    - [employer], strength 3x/week (gets workouts from a friend who used to be a Systems analyst)   - was doing Runner, broadcasting/film/video for dragon boating   - dragon boating 3x/week   General Activity: walks dog 3x/day  Tracking: FitBit, sends reminders to move; aims for 10k steps  Medical Restrictions: -  Physical Barriers/Ailments: -    **will discuss more at later date**  Stress:  Emotional Eating:  Sleep:   Support:  Barriers/Challenges:      Provided information of general insurance coverage (can vary based on plan):  - During the first 6 months, the goal is to lose 6.6 lbs from initial weight with dietitian at today's visit.    - If weight loss of 6.6 lbs or greater in this time period and BMI still >30, continued coverage for 6 additional months.    - If weight loss of 6.6 lbs is not met in this time period, insurance may require a 6 month break and during this time can discuss other program options or self-pay.    - If BMI drops below 30 in this time period, insurance may no longer cover visits.  Can discuss other program options or self-pay.    Nutrition Diagnosis:  Overweight/obesity  Related to: excessive energy intake, food- and nutrition-related knowledge deficit  As evidenced by: Body mass index is 31.76 kg/m?., diet recall, pt interview, uncertainty regarding nutrition-related recommendations      Intervention/Plan:     1. Increase self-awareness and accountability through self-monitoring.    - IBH workshop next month  - Follow up with RD for support     2. Consume a minimum of 5 cups of fruits/vegetables daily.   Focus on getting at least half of foods as fruits and non-starchy vegetables. Whole grains, lean meats/eggs, some dairy, nuts and seeds made up the remainder of the diet. Foods both high in calories and low in nutritive quality are consumed rarely (baked goods, fried foods, high fat/processed meats, sweets).    Specifically discussed today:  - start day with high protein breakfast -- see handout   - let's opt for lower calorie breakfast options at [facility] that we discussed today    3. Progress to meet recommendation for 150+ minutes of physical activity for general health, while increasing to 300+ minutes weekly for continued weight loss or increased success for weight maintenance.   - continue strength training + dragon boating    4. Increase activities of daily living and reduce sedentary time.    Step goal: 10,000 steps/day    5. Make modifications to environment to support healthy lifestyle changes.         Nutrition Monitoring and Evaluation:     Time spent with patient: 40 minutes    Follow up in 1  month(s)    Contact information provided for questions     Thank you,    Claiborne Rigg, RD LD

## 2023-03-26 ENCOUNTER — Encounter: Admit: 2023-03-26 | Discharge: 2023-03-26 | Payer: MEDICARE

## 2023-03-26 ENCOUNTER — Ambulatory Visit: Admit: 2023-03-26 | Discharge: 2023-03-26 | Payer: MEDICARE

## 2023-03-26 DIAGNOSIS — E785 Hyperlipidemia, unspecified: Secondary | ICD-10-CM

## 2023-03-26 DIAGNOSIS — B9689 Other specified bacterial agents as the cause of diseases classified elsewhere: Secondary | ICD-10-CM

## 2023-03-26 DIAGNOSIS — G4733 Obstructive sleep apnea (adult) (pediatric): Secondary | ICD-10-CM

## 2023-03-26 DIAGNOSIS — Z923 Personal history of irradiation: Secondary | ICD-10-CM

## 2023-03-26 DIAGNOSIS — T7840XA Allergy, unspecified, initial encounter: Secondary | ICD-10-CM

## 2023-03-26 DIAGNOSIS — J302 Other seasonal allergic rhinitis: Secondary | ICD-10-CM

## 2023-03-26 DIAGNOSIS — Z79890 Hormone replacement therapy (postmenopausal): Secondary | ICD-10-CM

## 2023-03-26 DIAGNOSIS — E669 Obesity, unspecified: Secondary | ICD-10-CM

## 2023-03-26 DIAGNOSIS — G9331 Postviral fatigue syndrome: Secondary | ICD-10-CM

## 2023-03-26 DIAGNOSIS — M1990 Unspecified osteoarthritis, unspecified site: Secondary | ICD-10-CM

## 2023-03-26 DIAGNOSIS — J019 Acute sinusitis, unspecified: Secondary | ICD-10-CM

## 2023-03-26 DIAGNOSIS — R062 Wheezing: Secondary | ICD-10-CM

## 2023-03-26 DIAGNOSIS — C50919 Malignant neoplasm of unspecified site of unspecified female breast: Secondary | ICD-10-CM

## 2023-03-26 MED ORDER — BUDESONIDE-FORMOTEROL 80-4.5 MCG/ACTUATION IN HFAA
1 | Freq: Two times a day (BID) | RESPIRATORY_TRACT | 3 refills | 30.00000 days | Status: DC
Start: 2023-03-26 — End: 2023-03-27

## 2023-03-26 MED ORDER — AMOXICILLIN-POT CLAVULANATE 875-125 MG PO TAB
1 | ORAL_TABLET | Freq: Two times a day (BID) | ORAL | 0 refills | 7.00000 days | Status: AC
Start: 2023-03-26 — End: ?

## 2023-03-26 MED ORDER — PREDNISONE 20 MG PO TAB
20 mg | ORAL_TABLET | Freq: Two times a day (BID) | ORAL | 0 refills | Status: AC
Start: 2023-03-26 — End: ?

## 2023-03-26 NOTE — Progress Notes
Date of Service: 03/26/2023  MRN#: [id number]  DOB: [birth year]  PCP: Dimitri Ped    Subjective:   Chief Complaint   Patient presents with    Flu     Cough for couple of weeks.. Ear fullness, sore throat this morning.       Alicia Sharp is a 67 y.o.female patient who presents for        History of Present Illness      Congestion  Has been sick ongoing for a couple of weeks   Sae urgent care on 03/10/2023 and was told it was viral   Ongoing sore throat   Mild productive cough  Has noticed nasal congestion and sinus congestion   Reports she feels like she is a tunnel   Head pressure and ear pressure   Denies fevers or chills  Denies SOA or DOE  Was having wheezing: currently on albuterol prn   Still having some mild wheezing   Denies chest pain   Denies n/v/d  Using Mucinex, tessalon pearls, albuterol       Past Medical History:   Diagnosis Date    Allergy     seasonal    Arthritis     Breast cancer (HCC) 07/2018    left breast    Hormone replacement therapy     Hyperlipidemia     Obesity     Obstructive sleep apnea last month    Osteoarthritis 06/2013    Personal history of irradiation     Seasonal allergic reaction     Seasonal allergies        Review of Systems  Per HPI     Objective:     Medication:   albuterol sulfate (PROAIR HFA) 90 mcg/actuation HFA aerosol inhaler Inhale two puffs by mouth into the lungs every 6 hours as needed for Wheezing or Shortness of Breath.    amoxicillin-potassium clavulanate (AUGMENTIN) 875/125 mg tablet Take one tablet by mouth twice daily with meals for 10 days.    anastrozole (ARIMIDEX) 1 mg tablet TAKE 1 TABLET BY MOUTH once daily    atorvastatin (LIPITOR) 40 mg tablet TAKE 1/2 TABLET BY MOUTH EVERY DAY    benzonatate (TESSALON) 200 mg capsule Take one capsule by mouth three times daily as needed for Cough for up to 30 doses.    budesonide-formoterol HFA (SYMBICORT) 80-4.5 mcg/actuation aerosol inhaler Inhale one puff by mouth into the lungs twice daily.    calcium carbonate (TUMS PO) Take  by mouth. Twice a day    CHOLEcalciferoL (vitamin D3) (VITAMIN D3) 5000 unit tablet Take one tablet by mouth.    fexofenadine (ALLEGRA) 180 mg tablet Take one tablet by mouth daily.    fluticasone propionate (FLONASE ALLERGY RELIEF) 50 mcg/actuation nasal spray, suspension Apply two sprays to each nostril as directed twice daily.    meloxicam (MOBIC) 7.5 mg tablet Take one tablet by mouth daily.    metFORMIN-XR (GLUCOPHAGE XR) 500 mg extended release tablet Take one tablet by mouth daily with dinner for 7 days, THEN two tablets daily with dinner for 7 days, THEN three tablets daily with dinner for 35 days.    ONE DAILY MULTIVITAMIN PO     predniSONE (DELTASONE) 20 mg tablet Take one tablet by mouth twice daily with meals for 5 days.       Vitals:    03/26/23 1745   BP: 132/58   BP Source: Arm, Right Upper   Pulse: 62   Temp: 37.1 ?C (98.8 ?F)  Resp: 18   SpO2: 98%   TempSrc: Oral   PainSc: Zero   Weight: 85 kg (187 lb 6.4 oz)   Height: 162.6 cm (5' 4)       Body mass index is 32.17 kg/m?Marland Kitchen      Physical Exam    GEN: awake, alert, NAD  HEENT: NCAT, EOMI, PERRL, nasal congestion, tonsil 0+ with mild erythema, bilateral TM clear with notable effusions   + TTP over maxillary sinuses   CV: RRR no m/r/g  LUNGS: CTA B, no r/r/w  ABD: BS+, soft, NT, ND  EXT: no obvious deformity noted, FROM x4  NEURO: CN 2-12 intact, no focal deficits  SKIN: warm, dry, no lesions noted       Assessment and Plan:  1. Acute bacterial sinusitis  Symptoms ongoing for 10 + days   Will start Augmentin BID for 10 days. Take full course. Take with food.   Start prednisone 20mg  BID for 5 days   Start Symbicort BID   Continue albuterol prn   Increase rest and hydration   Can continue Mucinex as needed   Can continue Flonase as needed   RTC if no improvement of symptoms     - amoxicillin-potassium clavulanate (AUGMENTIN) 875/125 mg tablet; Take one tablet by mouth twice daily with meals for 10 days.  Dispense: 20 tablet; Refill: 0    2. Wheezing  As above       - predniSONE (DELTASONE) 20 mg tablet; Take one tablet by mouth twice daily with meals for 5 days.  Dispense: 10 tablet; Refill: 0  - budesonide-formoterol HFA (SYMBICORT) 80-4.5 mcg/actuation aerosol inhaler; Inhale one puff by mouth into the lungs twice daily.  Dispense: 30.6 g; Refill: 3    3. Post viral syndrome  As above     - predniSONE (DELTASONE) 20 mg tablet; Take one tablet by mouth twice daily with meals for 5 days.  Dispense: 10 tablet; Refill: 0  - budesonide-formoterol HFA (SYMBICORT) 80-4.5 mcg/actuation aerosol inhaler; Inhale one puff by mouth into the lungs twice daily.  Dispense: 30.6 g; Refill: 3    Keep all scheduled appointments    Orders Placed This Encounter    amoxicillin-potassium clavulanate (AUGMENTIN) 875/125 mg tablet    predniSONE (DELTASONE) 20 mg tablet    budesonide-formoterol HFA (SYMBICORT) 80-4.5 mcg/actuation aerosol inhaler       Total of 30 minutes were spent on the same day of the visit including preparing to see the patient, obtaining and/or reviewing separately obtained history, performing a medically appropriate examination and/or evaluation, counseling and educating the patient/family/caregiver, ordering medications, tests, or procedures, referring and communication with other health care professionals, documenting clinical information in the electronic or other health record, independently interpreting results and communicating results to the patient/family/caregiver, and care coordination.     Future Appointments   Date Time Provider Department Center   04/21/2023  8:40 AM Liam Rogers, RD [facility] IM   04/23/2023  9:00 AM Sharilyn Sites [facility]   04/30/2023  9:00 AM Sharilyn Sites [facility]   05/07/2023  9:00 AM Sharilyn Sites [facility]   05/14/2023  9:00 AM Sharilyn Sites [facility]   07/04/2023  8:30 AM Dimitri Ped, MD [facility] IM   07/22/2023  9:30 AM [facility] - IC ROOM 1 IC1[facility] ICC Radiolog   07/22/2023 10:00 AM Bishop Limbo, APRN-NP [id number] [place] Exam   09/11/2023  3:00 PM [facility] Radiolo     Patient Instructions   Thank  you so much for visiting with our care team today. We look forward to seeing you again.     Our plan:       Your results from labs will be released to you directly in My Chart prior to [place] seeing them. If you have not heard from [place] within 2 weeks of your testing please reach out to our office or send [place] a MyChart message.     If you are needing medication refills, please use the MyChart Refill request or contact your pharmacy directly to request medication refills.  Please allow 72 hours.    MyChart: You may sign up for MyChart to receive test results, contact our team with questions, or have prescription requests filled.    Follow the instructions at the end of visit summary    You can call us at [phone] or send a MyChart message.     Lowanda Foster, APRN-NP

## 2023-03-27 ENCOUNTER — Encounter: Admit: 2023-03-27 | Discharge: 2023-03-27 | Payer: MEDICARE

## 2023-03-27 DIAGNOSIS — R062 Wheezing: Secondary | ICD-10-CM

## 2023-03-27 DIAGNOSIS — G9331 Post viral syndrome: Secondary | ICD-10-CM

## 2023-03-27 MED ORDER — BUDESONIDE-FORMOTEROL 80-4.5 MCG/ACTUATION IN HFAA
1 | Freq: Two times a day (BID) | RESPIRATORY_TRACT | 0 refills | 30.00000 days | Status: DC
Start: 2023-03-27 — End: 2023-03-27

## 2023-03-27 MED ORDER — BUDESONIDE-FORMOTEROL 80-4.5 MCG/ACTUATION IN HFAA
11 refills
Start: 2023-03-27 — End: ?

## 2023-03-27 MED ORDER — BUDESONIDE-FORMOTEROL 80-4.5 MCG/ACTUATION IN HFAA
2 | Freq: Two times a day (BID) | RESPIRATORY_TRACT | 0 refills | 30.00000 days | Status: AC
Start: 2023-03-27 — End: ?

## 2023-03-27 NOTE — Telephone Encounter
Received voicemail from [employer] regarding prescription for symbicort inhaler. Pharmacist stated, The directions on this prescription are one puff twice daily, however, the standard dosing for this medication is two puffs twice daily so I was calling to clarify the intended dosing for this medication. Routing to Andrey Farmer, APRN to review and advise.  Sharin Grave, RN

## 2023-03-27 NOTE — Telephone Encounter
New script sent! Thanks     MEK

## 2023-04-01 ENCOUNTER — Ambulatory Visit: Admit: 2023-04-01 | Discharge: 2023-04-01 | Payer: MEDICARE

## 2023-04-01 ENCOUNTER — Encounter: Admit: 2023-04-01 | Discharge: 2023-04-01 | Payer: MEDICARE

## 2023-04-16 ENCOUNTER — Encounter: Admit: 2023-04-16 | Discharge: 2023-04-16 | Payer: MEDICARE

## 2023-04-21 ENCOUNTER — Encounter: Admit: 2023-04-21 | Discharge: 2023-04-21 | Payer: MEDICARE

## 2023-04-21 DIAGNOSIS — E669 Obesity, unspecified: Secondary | ICD-10-CM

## 2023-04-21 NOTE — Patient Instructions
1. Increase self-awareness and accountability through self-monitoring.    - IBH workshop next month  - Follow up with RD for support      2. Consume a minimum of 5 cups of fruits/vegetables daily.   Focus on getting at least half of foods as fruits and non-starchy vegetables. Whole grains, lean meats/eggs, some dairy, nuts and seeds made up the remainder of the diet. Foods both high in calories and low in nutritive quality are consumed rarely (baked goods, fried foods, high fat/processed meats, sweets).     Specifically discussed today:  - start day with high protein breakfast -- see handout   - let's opt for lower calorie breakfast options at [facility] that we discussed today  - include a fruit and/or vegetable at meals  - balanced snack to include protein + carb -- see snack planning tool     3. Progress to meet recommendation for 150+ minutes of physical activity for general health, while increasing to 300+ minutes weekly for continued weight loss or increased success for weight maintenance.   - continue strength training + dragon boating     4. Increase activities of daily living and reduce sedentary time.    Step goal: 10,000 steps/day

## 2023-04-21 NOTE — Progress Notes
Primary goal of visit: Follow-up nutrition counseling visit for Intensive Behavioral Therapy for Weight Management    Nutrition Assessment of Patient:    Ht Readings from Last 1 Encounters:   03/26/23 162.6 cm (5' 4)       Wt Readings from Last 5 Encounters:   04/21/23 81.4 kg (179 lb 6.4 oz)   03/26/23 85 kg (187 lb 6.4 oz)   03/24/23 83.9 kg (185 lb)   01/28/23 85.7 kg (189 lb)   12/09/22 83.9 kg (185 lb)       Weight change: Lost 6 lbs since initial weight of 185 lbs on 03/24/23     Body mass index is 30.79 kg/m?Marland Kitchen  BMI Categories Adult: class I obese   Total Energy Expenditure (based on Mifflin equation x activity factor): 1370 x 1.375 = 1870 kcal  Total Calorie Prescription for weight loss: 1200-1370 kcal  Needs to promote: weight loss and maintenance  6 months: 09/24/23    Notes:     Tried overnight oats and liked this. Adding collagen peptides, dried fruit, almonds  [employer] - gets egg bites when she goes.    Working on adding in more fruits + veggies. Easier to do with farmers market.   Includes a vegetable with dinner. Likes this.     IBH - moved to next month d/t scheduling purposes       Intervention/Plan:     1. Increase self-awareness and accountability through self-monitoring.    - IBH workshop next month  - Follow up with RD for support      2. Consume a minimum of 5 cups of fruits/vegetables daily.   Focus on getting at least half of foods as fruits and non-starchy vegetables. Whole grains, lean meats/eggs, some dairy, nuts and seeds made up the remainder of the diet. Foods both high in calories and low in nutritive quality are consumed rarely (baked goods, fried foods, high fat/processed meats, sweets).     Specifically discussed today:  - start day with high protein breakfast -- see handout   - let's opt for lower calorie breakfast options at [facility] that we discussed today  - include a fruit and/or vegetable at meals  - balanced snack to include protein + carb -- see snack planning tool     3. Progress to meet recommendation for 150+ minutes of physical activity for general health, while increasing to 300+ minutes weekly for continued weight loss or increased success for weight maintenance.   - continue strength training + dragon boating     4. Increase activities of daily living and reduce sedentary time.    Step goal: 10,000 steps/day     5. Make modifications to environment to support healthy lifestyle changes.           Nutrition Monitoring and Evaluation:     Time spent with patient: 20 minutes    Follow up in 1 month(s)    Contact information provided for questions     Thank you,    Claiborne Rigg, RD LD

## 2023-04-25 ENCOUNTER — Encounter: Admit: 2023-04-25 | Discharge: 2023-04-25 | Payer: MEDICARE

## 2023-05-08 ENCOUNTER — Encounter: Admit: 2023-05-08 | Discharge: 2023-05-08 | Payer: MEDICARE

## 2023-05-08 MED ORDER — ANASTROZOLE 1 MG PO TAB
1 mg | ORAL_TABLET | Freq: Every day | ORAL | 2 refills
Start: 2023-05-08 — End: ?

## 2023-05-08 MED ORDER — MELOXICAM 7.5 MG PO TAB
7.5 mg | ORAL_TABLET | Freq: Every day | ORAL | 1 refills
Start: 2023-05-08 — End: ?

## 2023-05-08 MED ORDER — METFORMIN 500 MG PO TB24
1500 mg | ORAL_TABLET | Freq: Every day | ORAL | 1 refills | Status: AC
Start: 2023-05-08 — End: ?

## 2023-05-08 NOTE — Telephone Encounter
Requested Prescriptions   Pending Prescriptions Disp Refills    meloxicam (MOBIC) 7.5 mg tablet [Pharmacy Med Name: meloxicam 7.5 mg tablet] 90 tablet 1     Sig: TAKE ONE TABLET BY MOUTH DAILY       Analgesics: NSAIDS & COX2 Inhibitors Failed - 05/08/2023  5:24 PM        Failed - This refill cannot be delegated        Failed - HGB in normal range and within 360 days     Hemoglobin   Date Value Ref Range Status   07/08/2022 11.9 (L) 12.0 - 15.0 GM/DL Final   45/40/9811 91.4 (L) 12.0 - 15.0 GM/DL Final              Passed - Valid encounter within last 12 months     Recent Visits  Date Type Provider Dept   03/26/23 Office Visit Lowanda Foster, APRN-NP [facility]   12/09/22 Office Visit Murlean Iba, APRN-NP [facility]   10/07/22 Office Visit Telehealth Flanagin, Clide Dales, APRN-NP [facility]   07/03/22 Office Visit Dionisi, Tacey Ruiz, MD [facility]   06/06/22 Office Visit Flanagin, Clide Dales, APRN-NP [facility]   02/08/22 Office Visit Telehealth Altenhofen, Rosina Lowenstein, MD [facility]   10/31/21 Office Visit Steward Ros, APRN-NP [facility]   07/02/21 Office Visit Dionisi, Tacey Ruiz, MD [facility]   Showing recent visits within past 720 days and meeting all other requirements  Future Appointments  Date Type Provider Dept   05/21/23 Appointment Janus Molder, PhD [facility]   06/11/23 Appointment Janus Molder, PhD [facility]   06/18/23 Appointment Janus Molder, PhD [facility]   06/25/23 Appointment Janus Molder, PhD [facility]   07/04/23 Appointment Dionisi, Tacey Ruiz, MD [facility]   Showing future appointments within next 360 days and meeting all other requirements            Passed - BP completed in the last 12 months     BP Readings from Last 2 Encounters:   03/26/23 132/58   01/28/23 128/59             Passed - Cr in normal range and within 360 days     Creatinine   Date Value Ref Range Status 01/09/2023 0.81 0.4 - 1.00 MG/DL Final   78/29/5621 3.08 0.4 - 1.00 MG/DL Final              Passed - eGFR in normal range and within 360 days     eGFR Non African American   Date Value Ref Range Status   12/01/2018 >60 >60 mL/min Final     Comment:     The eGFR is not validated for use in drug dosing adjustments.  Continue to use   estimated creatinine clearance per dosing reference text.  Please contact the   Clinical Pharmacist for questions.     08/14/2018 >60 >60 mL/min Final     Comment:     The eGFR is not validated for use in drug dosing adjustments.  Continue to use   estimated creatinine clearance per dosing reference text.  Please contact the   Clinical Pharmacist for questions.       eGFR African American  Date Value Ref Range Status   12/01/2018 >60 >60 mL/min Final     Comment:     The eGFR is not validated for use in drug dosing adjustments.  Continue to use   estimated creatinine clearance per dosing reference text.  Please contact the   Clinical Pharmacist for questions.     08/14/2018 >60 >60 mL/min Final     Comment:     The eGFR is not validated for use in drug dosing adjustments.  Continue to use   estimated creatinine clearance per dosing reference text.  Please contact the   Clinical Pharmacist for questions.       eGFR   Date Value Ref Range Status   01/09/2023 >60 >60 mL/min Final     Comment:     eGFR calculated using the CKD-EPIcr_R equation   07/08/2022 >60 >60 mL/min Final     Comment:     eGFR calculated using the CKD-EPIcr_R equation

## 2023-05-21 ENCOUNTER — Ambulatory Visit: Admit: 2023-05-21 | Discharge: 2023-05-22 | Payer: MEDICARE

## 2023-05-21 DIAGNOSIS — Z6828 Body mass index (BMI) 28.0-28.9, adult: Secondary | ICD-10-CM

## 2023-05-21 NOTE — Progress Notes
No-Charge was used for [facility] group visit    Progress Note     Alicia Sharp is a 67 y.o.  female seen for a cognitive behavioral therapy for weight management workshop as part of the integrative behavioral health service within the Department of Internal Medicine.     Group date: 05/21/23   Session start: 9:00 AM  Session stop: 10:00 AM  Visit performed: MyChart.   BH Provider name: Sharilyn Sites, PsyD    Pt was informed that psychology intern, Rosana Berger, was present for the duration of the visit. Patient consented to receiving services through [facility].       Focus of visit: Cultivating realistic expectations for weight loss, understanding the stages of change, recognizing strategies for maintaining motivation, creating new habits, identifying mood as a trigger for overeating, behavioral activation.    Today's session began with an overview of seminar expectations and purpose. Setting realistic expectations around weight loss was discussed. Psychoeducation on the stages of change was provided. Strategies for cultivating motivation and resolving ambivalence were introduced. The importance of repetition and practice was discussed in the context of creating new habits. Psychoeducation on behavioral activation was provided in the context of mood as a trigger for unhelpful eating. The importance of celebrating small victories was discussed. Patient was provided with 3 opportunities for between-session practice (completing behavioral activation schedule, daily review of motivators for change list, giving yourself credit). The topics covered in next week's session was provided, should patient be interested in attending.    Patient presented with euthymic mood and was cooperative and attentive for the duration of session. Patient participated appropriately and appeared to be alert and oriented x4.  Patient maintained appropriate eye contact as could be assessed through teleconference. Patient's speech was fluid, appropriate in tone and volume. The following were NOT assessed due to the nature of today's group skills building workshop: suicidal or homicidal ideation, auditory or visual hallucinations, or self-harm, or delusional thinking.      Educational Handouts Provided/Worksheets Assigned (sent via MyChart and email prior to visit)  SESSION 1:  (1) The Pros and Cons of Behavior Change, (2) Behavioral Activation Activity Ideas    Plan/Recommendation: Follow-up with additional CBT for Weight Management Workshops as desired or recommended by referring provider    Length of visit: 60 minutes

## 2023-05-27 ENCOUNTER — Encounter: Admit: 2023-05-27 | Discharge: 2023-05-27 | Payer: MEDICARE

## 2023-05-27 DIAGNOSIS — E66811 Obesity, Class I, BMI 30-34.9: Secondary | ICD-10-CM

## 2023-05-27 NOTE — Progress Notes
Primary goal of visit: Follow-up nutrition counseling visit for Intensive Behavioral Therapy for Weight Management    Nutrition Assessment of Patient:    Ht Readings from Last 1 Encounters:   03/26/23 162.6 cm (5' 4)       Wt Readings from Last 5 Encounters:   04/21/23 81.4 kg (179 lb 6.4 oz)   03/26/23 85 kg (187 lb 6.4 oz)   03/24/23 83.9 kg (185 lb)   01/28/23 85.7 kg (189 lb)   12/09/22 83.9 kg (185 lb)   Pt declined weight today.    Weight change: Lost 6 lbs since initial weight of 185 lbs on 03/24/23      Body mass index is 30.79 kg/m?Marland Kitchen  BMI Categories Adult: class I obese   Total Energy Expenditure (based on Mifflin equation x activity factor): 1370 x 1.375 = 1870 kcal  Total Calorie Prescription for weight loss: 1200-1370 kcal  Needs to promote: weight loss and maintenance  6 months: 09/24/23    Notes:     Started IBH workshop.     Got more consistent with fruit. Then went out of town and dined out a bit more. She feels she does well with choices + portions.  Feels she is getting snacky and reaching for carbs - pretzels, wheat thins. Plans to have cheese with it but doesn't get cut (buys block cheese).    Protein bars 15 grams for breakfast some mornings.       Intervention/Plan:     1. Increase self-awareness and accountability through self-monitoring.    - IBH workshop next month  - Follow up with RD for support   - track your fruits/vegetables on pen/paper or notes app or FitBit app     2. Consume a minimum of 5 cups of fruits/vegetables daily.   Focus on getting at least half of foods as fruits and non-starchy vegetables. Whole grains, lean meats/eggs, some dairy, nuts and seeds made up the remainder of the diet. Foods both high in calories and low in nutritive quality are consumed rarely (baked goods, fried foods, high fat/processed meats, sweets).     Specifically discussed today:  - start day with high protein breakfast -- see handout; okay to add collagen peptide to your coffee just make sure you are having another source of protein with it since it is not a complete protein  - include a fruit and/or vegetable at meals; think diversity + variety when it comes to this! This will give you different nutrients and keep things exciting  - balanced snack to include protein + carb -- see snack planning tool; have these ready to go; consider buying pre sliced cheese     3. Progress to meet recommendation for 150+ minutes of physical activity for general health, while increasing to 300+ minutes weekly for continued weight loss or increased success for weight maintenance.   - continue strength training + dragon boating     4. Increase activities of daily living and reduce sedentary time.    Step goal: 10,000 steps/day     5. Make modifications to environment to support healthy lifestyle changes.   - portion out snacks & put the container away; could try individually portioned things    Nutrition Monitoring and Evaluation:     Time spent with patient: 20 minutes    Follow up in 1 month(s)    Contact information provided for questions     Thank you,    Claiborne Rigg, RD LD

## 2023-05-27 NOTE — Patient Instructions
1. Increase self-awareness and accountability through self-monitoring.    - IBH workshop next month  - Follow up with RD for support   - track your fruits/vegetables on pen/paper or notes app or FitBit app     2. Consume a minimum of 5 cups of fruits/vegetables daily.   Focus on getting at least half of foods as fruits and non-starchy vegetables. Whole grains, lean meats/eggs, some dairy, nuts and seeds made up the remainder of the diet. Foods both high in calories and low in nutritive quality are consumed rarely (baked goods, fried foods, high fat/processed meats, sweets).     Specifically discussed today:  - start day with high protein breakfast -- see handout; okay to add collagen peptide to your coffee just make sure you are having another source of protein with it since it is not a complete protein  - include a fruit and/or vegetable at meals; think diversity + variety when it comes to this! This will give you different nutrients and keep things exciting  - balanced snack to include protein + carb -- see snack planning tool; have these ready to go; consider buying pre sliced cheese     3. Progress to meet recommendation for 150+ minutes of physical activity for general health, while increasing to 300+ minutes weekly for continued weight loss or increased success for weight maintenance.   - continue strength training + dragon boating     4. Increase activities of daily living and reduce sedentary time.    Step goal: 10,000 steps/day     5. Make modifications to environment to support healthy lifestyle changes.   - portion out snacks & put the container away; could try individually portioned things

## 2023-06-11 ENCOUNTER — Ambulatory Visit: Admit: 2023-06-11 | Discharge: 2023-06-12 | Payer: MEDICARE

## 2023-06-11 DIAGNOSIS — Z6828 Body mass index (BMI) 28.0-28.9, adult: Secondary | ICD-10-CM

## 2023-06-11 DIAGNOSIS — E66811 Obesity, Class I, BMI 30-34.9: Secondary | ICD-10-CM

## 2023-06-18 ENCOUNTER — Ambulatory Visit: Admit: 2023-06-18 | Discharge: 2023-06-19 | Payer: MEDICARE

## 2023-06-18 DIAGNOSIS — E66811 Obesity, Class I, BMI 30-34.9: Secondary | ICD-10-CM

## 2023-06-18 NOTE — Progress Notes
No-Charge was used for [facility] group visit     Progress Note       Alicia Sharp is a 67 y.o.  female seen for a cognitive behavioral therapy for weight management workshop as part of the integrative behavioral health service within the Department of Internal Medicine.     Group date: 06/18/23   Session start: 9:00 AM  Session stop: 10:00 AM  Visit performed: MyChart.   BH Provider name: Sharilyn Sites, PsyD      Pt was informed that psychology intern, Rosana Berger, was present for the duration of the visit. Patient consented to receiving services through [facility].       Focus of Visit: Understanding automatic thoughts, identifying self-sabotaging thoughts that interfere with weight/health related goals, restructuring unhelpful thoughts    Today's session began with a review of the cognitive behavioral (CBT) model. Psychoeducation on automatic thoughts was provided and common examples of self-sabotaging thoughts related to weight and eating were reviewed. The utility of restructuring unhelpful thoughts was discussed. The process of restructuring unhelpful thoughts was reviewed via an example. Patient was provided with an opportunity for between-session cognitive restructuring practice. The topics covered in next week's session was provided, should patient be interested in attending.    Patient presented with euthymic mood and was cooperative and attentive for the duration of session. Patient participated appropriately and appeared to be alert and oriented x4.  Patient maintained appropriate eye contact as could be assessed through teleconference. Patient's speech was fluid, appropriate in tone and volume. The following were NOT assessed due to the nature of today's group skills building workshop: suicidal or homicidal ideation, auditory or visual hallucinations, or self-harm, or delusional thinking.      Specific Intervention: Cognitive behavioral therapy    Educational Handouts Provided/Worksheets Assigned (sent via MyChart prior to visit)  SESSION 3:  (1)  Restructuring thoughts worksheet     Plan/Recommendation: Follow-up with additional CBT for Weight Management Workshops as desired or recommended by referring provider    Patient goals: To improve self-management of eating habits     Length of visit: 60 minutes

## 2023-06-24 ENCOUNTER — Encounter: Admit: 2023-06-24 | Discharge: 2023-06-24 | Payer: MEDICARE

## 2023-06-24 ENCOUNTER — Ambulatory Visit: Admit: 2023-06-24 | Discharge: 2023-06-24 | Payer: MEDICARE

## 2023-06-24 DIAGNOSIS — E66811 Obesity, Class I, BMI 30-34.9: Secondary | ICD-10-CM

## 2023-06-24 NOTE — Patient Instructions
1. Increase self-awareness and accountability through self-monitoring.    - Follow up with RD for support      2. Consume a minimum of 5 cups of fruits/vegetables daily.   Focus on getting at least half of foods as fruits and non-starchy vegetables. Whole grains, lean meats/eggs, some dairy, nuts and seeds made up the remainder of the diet. Foods both high in calories and low in nutritive quality are consumed rarely (baked goods, fried foods, high fat/processed meats, sweets).     Specifically discussed today:  - keep frozen meals on hand for when you are in a bind -- [employer], [employer], Healthy Choice  - can ask Chat GPT for meal ideas + using things you have on hand!  - see handout on holiday eating (emailed) -- we will discuss this more     3. Progress to meet recommendation for 150+ minutes of physical activity for general health, while increasing to 300+ minutes weekly for continued weight loss or increased success for weight maintenance.   - continue strength training + dragon boating     4. Increase activities of daily living and reduce sedentary time.    Step goal: 10,000 steps/day     5. Make modifications to environment to support healthy lifestyle changes.   - consider a meal service like Daily [employer] or [employer]

## 2023-06-24 NOTE — Progress Notes
Primary goal of visit: Follow-up nutrition counseling visit for Intensive Behavioral Therapy for Weight Management    Nutrition Assessment of Patient:    Ht Readings from Last 1 Encounters:   03/26/23 162.6 cm (5' 4)       Wt Readings from Last 5 Encounters:   06/24/23 81.5 kg (179 lb 9.6 oz)   04/21/23 81.4 kg (179 lb 6.4 oz)   03/26/23 85 kg (187 lb 6.4 oz)   03/24/23 83.9 kg (185 lb)   01/28/23 85.7 kg (189 lb)       Weight change: Lost 6 lbs since initial weight of 185 lbs on 03/24/23      Body mass index is 30.79 kg/m?Marland Kitchen  BMI Categories Adult: class I obese   Total Energy Expenditure (based on Mifflin equation x activity factor): 1370 x 1.375 = 1870 kcal  Total Calorie Prescription for weight loss: 1200-1370 kcal  Needs to promote: weight loss and maintenance  6 months: 09/24/23    Notes:     She is doing the [facility] workshop.      Fruits + vegetables have been more challenging.    Also having some non-hunger eating.     She has been doing a bit of traveling.       Intervention/Plan:     1. Increase self-awareness and accountability through self-monitoring.    - Follow up with RD for support      2. Consume a minimum of 5 cups of fruits/vegetables daily.   Focus on getting at least half of foods as fruits and non-starchy vegetables. Whole grains, lean meats/eggs, some dairy, nuts and seeds made up the remainder of the diet. Foods both high in calories and low in nutritive quality are consumed rarely (baked goods, fried foods, high fat/processed meats, sweets).     Specifically discussed today:  - keep frozen meals on hand for when you are in a bind -- [employer], [employer], [employer]  - can ask Chat GPT for meal ideas + using things you have on hand!  - see handout on holiday eating (emailed) -- we will discuss this more     3. Progress to meet recommendation for 150+ minutes of physical activity for general health, while increasing to 300+ minutes weekly for continued weight loss or increased success for weight maintenance.   - continue strength training + dragon boating     4. Increase activities of daily living and reduce sedentary time.    Step goal: 10,000 steps/day     5. Make modifications to environment to support healthy lifestyle changes.   - consider a meal service like Daily [employer] or [employer]       Nutrition Monitoring and Evaluation:     Time spent with patient: 20 minutes    Follow up in 1 month(s)    Contact information provided for questions     Thank you,    Claiborne Rigg, RD LD

## 2023-06-25 ENCOUNTER — Ambulatory Visit: Admit: 2023-06-25 | Discharge: 2023-06-26 | Payer: MEDICARE

## 2023-06-25 DIAGNOSIS — E66811 Obesity, Class I, BMI 30-34.9: Secondary | ICD-10-CM

## 2023-07-02 ENCOUNTER — Encounter: Admit: 2023-07-02 | Discharge: 2023-07-02 | Payer: MEDICARE

## 2023-07-04 ENCOUNTER — Ambulatory Visit: Admit: 2023-07-04 | Discharge: 2023-07-05 | Payer: MEDICARE

## 2023-07-04 ENCOUNTER — Encounter: Admit: 2023-07-04 | Discharge: 2023-07-04 | Payer: MEDICARE

## 2023-07-04 ENCOUNTER — Ambulatory Visit: Admit: 2023-07-04 | Discharge: 2023-07-04 | Payer: MEDICARE

## 2023-07-05 ENCOUNTER — Encounter: Admit: 2023-07-05 | Discharge: 2023-07-05 | Payer: MEDICARE

## 2023-07-08 ENCOUNTER — Encounter: Admit: 2023-07-08 | Discharge: 2023-07-08 | Payer: MEDICARE

## 2023-07-08 ENCOUNTER — Ambulatory Visit: Admit: 2023-07-08 | Discharge: 2023-07-09 | Payer: MEDICARE

## 2023-07-09 NOTE — Patient Instructions
 Thank you for coming to see [place] today.   Please call our office or send a message through MyChart if you have any questions or concerns.          Dr. Dellia Cloud and Eyvonne Mechanic, APRN  Delorise Royals, APRN  Nurses: Kris Mouton, Karin Lieu and [place]ite Domonick Sittner  Phone: [phone]   Fax: [phone]    [facility]  [address] [place], [place] [address]    [facility]  [address]  [place], [place] [address]

## 2023-07-12 ENCOUNTER — Encounter: Admit: 2023-07-12 | Discharge: 2023-07-12 | Payer: MEDICARE

## 2023-07-12 MED ORDER — CYANOCOBALAMIN (VITAMIN B-12) 1,000 MCG PO TAB
1000 ug | Freq: Every day | ORAL | 0 refills | 29.00000 days | Status: AC
Start: 2023-07-12 — End: ?

## 2023-07-13 ENCOUNTER — Encounter: Admit: 2023-07-13 | Discharge: 2023-07-13 | Payer: MEDICARE

## 2023-07-15 ENCOUNTER — Ambulatory Visit: Admit: 2023-07-15 | Discharge: 2023-07-15 | Payer: MEDICARE

## 2023-07-15 ENCOUNTER — Encounter: Admit: 2023-07-15 | Discharge: 2023-07-15 | Payer: MEDICARE

## 2023-07-15 DIAGNOSIS — Z23 Encounter for immunization: Secondary | ICD-10-CM

## 2023-07-15 NOTE — Progress Notes
Patient arrived for a nurse visit to receive covid vaccine. Patient consent for right arm. Patient left satisfied.

## 2023-07-21 ENCOUNTER — Encounter: Admit: 2023-07-21 | Discharge: 2023-07-21 | Payer: MEDICARE

## 2023-07-22 ENCOUNTER — Ambulatory Visit: Admit: 2023-07-22 | Discharge: 2023-07-23 | Payer: MEDICARE

## 2023-07-22 ENCOUNTER — Encounter: Admit: 2023-07-22 | Discharge: 2023-07-22 | Payer: MEDICARE

## 2023-07-22 DIAGNOSIS — C50412 Malignant neoplasm of upper-outer quadrant of left female breast: Secondary | ICD-10-CM

## 2023-07-22 NOTE — Progress Notes
Name: Alicia Sharp          MRN: [id number]      DOB: 1956/04/14      AGE: 67 y.o.   DATE OF SERVICE: 07/22/2023             Reason for Visit:  Heme/Onc Care       Cancer Staging   Malignant neoplasm of upper-outer quadrant of left breast in female, estrogen receptor positive (HCC)  Staging form: Breast, AJCC 8th Edition  - Clinical: Stage IA (cT1c, cN0, cM0, G2, ER+, PR+, HER2-) - Signed by Carylon Perches, APRN on 08/25/2018  - Pathologic: Stage IA (pT1c, pN0(sn), cM0, G2, ER+, PR+, HER2-) - Signed by Carylon Perches, APRN on 08/25/2018      Alicia Sharp is a 67 y.o. female who presented to the [facility] on 07/16/2018 at age 68 for evaluation of her left breast cancer. An abnormality was noted on screening mammogram. Left breast biopsy 07/02/18 revealed a grade 2, IDC at 12:00. S/p left lumpectomy/SLNB. She reports that she tolerated surgery generally well.  Completed adjuvant radiation.     Current Therapy:  Arimidex    History of Present Illness:    The patient presents for a follow-up visit.    She is tolerating anastrozole well.     She is following with PCP but currently setting up with a new provider at [facility].     She is anxious about stopping the endocrine therapy in March. Noted her OncotypeDX had noted a recurrence score but that was projected at nine years and worried about stopping at five years.  She has known others to have recurrence of disease many years out and worried what it would mean to stop too early.        Review of Systems   HENT:  Negative for mouth sores.    Eyes:  Negative for visual disturbance.   Respiratory:  Negative for shortness of breath.    Cardiovascular:  Negative for chest pain.   Gastrointestinal:  Negative for blood in stool, constipation and diarrhea.   Musculoskeletal:  Positive for arthralgias.   Skin:  Negative for rash.   Neurological:  Negative for headaches.   Psychiatric/Behavioral:  Negative for sleep disturbance.          Past Medical History: Diagnosis Date    Allergy     seasonal    Arthritis     Back pain 6-24    Breast cancer (HCC) 07/2018    left breast    Hormone replacement therapy     Hyperlipidemia     Obesity     Obstructive sleep apnea last month    Osteoarthritis 06/2013    Personal history of irradiation     Seasonal allergic reaction     Seasonal allergies        Surgical History:   Procedure Laterality Date    LEFT RADIOACTIVE SEED LOCALIZED LUMPECTOMY Left 08/17/2018    Performed by Ignatius Specking, MD at [facility]    IDENTIFICATION SENTINEL LYMPH NODE Left 08/17/2018    Performed by Ignatius Specking, MD at [facility]    INJECTION RADIOACTIVE TRACER FOR SENTINEL NODE IDENTIFICATION Left 08/17/2018    Performed by Ignatius Specking, MD at [facility]    LEFT SENTINEL LYMPH NODE BIOPSY Left 08/17/2018    Performed by Ignatius Specking, MD at [facility]    COLONOSCOPY DIAGNOSTIC WITH SPECIMEN COLLECTION BY BRUSHING/ WASHING -  FLEXIBLE N/A 04/07/2019    Performed by Onnie Boer, MD at [facility]    BREAST SURGERY  1-20    lumpectomy    HX ROTATOR CUFF REPAIR      R 08/2010. L 09/2015.    HX WISDOM TEETH EXTRACTION      KNEE ARTHROSCOPY Left     2014    KNEE SURGERY  06/2013    L knee    SHOULDER SURGERY  R rotator 08/2010, L rotator2/2018        Family History   Problem Relation Name Age of Onset    Migraines Mother Ladajah Zahm     Bladder Cancer Mother Vaishali Bryon     High Cholesterol Mother Shontay Spadea     Hypertension Mother Ayak Oller     Cancer Mother Nazalia Dutra         bladder    Arthritis Mother Tahani Latulippe     Heart Disease Father Kawailani Rumer     Stroke Father Breck Coons     Coronary Artery Disease Father Breck Coons     High Cholesterol Father Breck Coons     Hypertension Father Breck Coons     Cancer-Breast Maternal Aunt Judy         30s    Cancer-Prostate Maternal Grandfather Minneola     Migraines Maternal Grandfather Malone     Stroke Maternal Grandmother Faxon Walworth [id number]     Anemia Brother Viviann Spare     Arthritis-osteo Mother Pualani Boggess         Social History     Socioeconomic History    Marital status: Widowed    Number of children: 2   Occupational History    Occupation: retired   Tobacco Use    Smoking status: Never    Smokeless tobacco: Never    Tobacco comments:     briefly in college   Substance and Sexual Activity    Alcohol use: Yes     Alcohol/week: 4.0 standard drinks of alcohol     Types: 4 Glasses of wine per week     Comment: 1 glass of wine/evening    Drug use: Never    Sexual activity: Not Currently     Partners: Male     Birth control/protection: Post-menopausal   Social History Narrative    Here with daughter Denny Peon          Objective:     No Known Allergies         albuterol sulfate (PROAIR HFA) 90 mcg/actuation HFA aerosol inhaler Inhale two puffs by mouth into the lungs every 6 hours as needed for Wheezing or Shortness of Breath.    anastrozole (ARIMIDEX) 1 mg tablet TAKE ONE TABLET BY MOUTH EVERY DAY    atorvastatin (LIPITOR) 40 mg tablet TAKE 1/2 TABLET BY MOUTH EVERY DAY    budesonide-formoterol HFA (SYMBICORT) 80-4.5 mcg/actuation aerosol inhaler Inhale two puffs by mouth into the lungs twice daily.    calcium carbonate (TUMS PO) Take  by mouth. Twice a day    CHOLEcalciferoL (vitamin D3) (VITAMIN D3) 5000 unit tablet Take one tablet by mouth.    cyanocobalamin (vitamin B-12) (VITAMIN B-12) 1,000 mcg tablet Take one tablet by mouth daily. Indications: prevention of vitamin B12 deficiency    fexofenadine (ALLEGRA) 180 mg tablet Take one tablet by mouth daily.    fluticasone propionate (FLONASE ALLERGY RELIEF) 50 mcg/actuation nasal spray, suspension Apply two sprays to each nostril as directed twice  daily.    meloxicam (MOBIC) 7.5 mg tablet TAKE ONE TABLET BY MOUTH DAILY    metFORMIN-XR (GLUCOPHAGE XR) 500 mg extended release tablet Take three tablets by mouth daily with dinner.    ONE DAILY MULTIVITAMIN PO        Vitals:    07/22/23 1007   BP: 119/49   BP Source: Arm, Right Upper   Pulse: 59   Temp: 36.4 ?C (97.6 ?F)   Resp: 16   SpO2: 99%   TempSrc: Temporal   PainSc: Two   Weight: 81.2 kg (179 lb)       Body mass index is 30.73 kg/m?Marland Kitchen     Pain Score: Two  Pain Loc: Back    Fatigue Scale: 0-None    Pain Addressed:  N/A    Patient Evaluated for a Clinical Trial: No treatment clinical trial available for this patient.     [place] Cooperative Oncology Group performance status is 0, Fully active, able to carry on all pre-disease performance without restriction.Marland Kitchen     Physical Exam  Vitals reviewed.   Constitutional:       Appearance: Normal appearance. She is well-developed.   HENT:      Head: Normocephalic and atraumatic.      Mouth/Throat:      Mouth: Mucous membranes are moist. No oral lesions.   Eyes:      General: No scleral icterus.  Cardiovascular:      Rate and Rhythm: Normal rate.   Pulmonary:      Effort: Pulmonary effort is normal. No respiratory distress.   Chest:   Breasts:     Right: No inverted nipple, mass, nipple discharge, skin change or tenderness.      Left: No inverted nipple, mass, nipple discharge, skin change or tenderness.       Abdominal:      General: There is no distension.      Tenderness: There is no abdominal tenderness. There is no guarding.   Musculoskeletal:      Cervical back: Neck supple.      Right lower leg: No edema.      Left lower leg: No edema.   Lymphadenopathy:      Upper Body:      Right upper body: No supraclavicular or axillary adenopathy.      Left upper body: No supraclavicular or axillary adenopathy.   Skin:     General: Skin is warm and dry.   Neurological:      Mental Status: She is alert and oriented to person, place, and time.   Psychiatric:         Mood and Affect: Mood normal.         Behavior: Behavior normal.                Recent Results (from the past 4 weeks)   HIP 2-3 VIEWS W PELVIS RT    Collection Time: 07/04/23 10:32 AM    Narrative    Exam: HIP 2-3 VIEWS W PELVIS RT, L SPINE W OBLIQUE    CLINICAL INDICATION: 67 years right hip pain and sciatica    COMPARISON: 09/07/2021, HIP 2-3 VIEWS W PELVIS RT.      Impression    1.  Little change since 09/07/2021.    2.  Mild arthritis of both hips. Mild lower lumbar spondylosis and moderate lower lumbar facet arthritis. Presumed small ribs at T12 and sacralized L5, with vestigial disc at L5-S1 and superior iliac spines project over  the level of upper L4.       Finalized by Newman Nickels, M.D. on 07/04/2023 10:46 AM. Dictated by Newman Nickels, M.D. on 07/04/2023 10:42 AM.   Malcolm [place] OBLIQUE    Collection Time: 07/04/23 10:32 AM    Narrative    Exam: HIP 2-3 VIEWS W PELVIS RT, L SPINE W OBLIQUE    CLINICAL INDICATION: 67 years right hip pain and sciatica    COMPARISON: 09/07/2021, HIP 2-3 VIEWS W PELVIS RT.      Impression    1.  Little change since 09/07/2021.    2.  Mild arthritis of both hips. Mild lower lumbar spondylosis and moderate lower lumbar facet arthritis. Presumed small ribs at T12 and sacralized L5, with vestigial disc at L5-S1 and superior iliac spines project over the level of upper L4.       Finalized by Newman Nickels, M.D. on 07/04/2023 10:46 AM. Dictated by Newman Nickels, M.D. on 07/04/2023 10:42 AM.   CBC AND DIFF    Collection Time: 07/08/23  1:44 PM   Result Value Ref Range    White Blood Cells 5.60 4.50 - 11.00 10*3/uL    Red Blood Cells 3.51 (L) 4.00 - 5.00 10*6/uL    Hemoglobin 11.4 (L) 12.0 - 15.0 g/dL    Hematocrit 16.1 (L) 36.0 - 45.0 %    MCV 97.1 80.0 - 100.0 fL    MCH 32.4 26.0 - 34.0 pg    MCHC 33.4 32.0 - 36.0 g/dL    RDW 09.6 04.5 - 40.9 %    Platelet Count 238 150 - 400 10*3/uL    MPV 8.7 7.0 - 11.0 fL    Neutrophils 61 41 - 77 %    Lymphocytes 27 24 - 44 %    Monocytes 8 4 - 12 %    Eosinophils 4 0 - 5 %    Basophils 1 0 - 2 %    Absolute Neutrophil Count 3.40 1.80 - 7.00 10*3/uL    Absolute Lymph Count 1.50 1.00 - 4.80 10*3/uL    Absolute Monocyte Count 0.40 0.00 - 0.80 10*3/uL    Absolute Eosinophil Count 0.20 0.00 - 0.45 10*3/uL    Absolute Basophil Count 0.00 0.00 - 0.20 10*3/uL   COMPREHENSIVE METABOLIC PANEL    Collection Time: 07/08/23  1:44 PM   Result Value Ref Range    Sodium 138 137 - 147 mmol/L    Potassium 3.9 3.5 - 5.1 mmol/L    Chloride 104 98 - 110 mmol/L    Glucose 83 70 - 100 mg/dL    Blood Urea Nitrogen 21 7 - 25 mg/dL    Creatinine 8.11 9.14 - 1.00 mg/dL    Calcium 8.9 8.5 - 78.2 mg/dL    Total Protein 6.9 6.0 - 8.0 g/dL    Total Bilirubin 0.3 0.3 - 1.2 mg/dL    Albumin 4.1 3.5 - 5.0 g/dL    Alk Phosphatase 80 25 - 110 U/L    AST 19 7 - 40 U/L    ALT 16 7 - 56 U/L    CO2 27 21 - 30 mmol/L    Anion Gap 8 3 - 12    Glomerular Filtration Rate (GFR) >60 >60 mL/min   LIPID PROFILE    Collection Time: 07/08/23  1:44 PM   Result Value Ref Range    Cholesterol 138 <200 mg/dL    Triglycerides 956 (H) <150 mg/dL    HDL 53 >21 mg/dL    LDL  75 <100 mg/dL    VLDL 16.1 mg/dL    Non HDL Cholesterol 85 mg/dL   HEMOGLOBIN W9U    Collection Time: 07/08/23  1:44 PM   Result Value Ref Range    Hemoglobin A1C 5.4 4.0 - 5.7 %   VITAMIN B12    Collection Time: 07/08/23  1:44 PM   Result Value Ref Range    Vitamin B12 328 180 - 914 pg/mL   25-OH VITAMIN D (D2 + D3)    Collection Time: 07/08/23  1:44 PM   Result Value Ref Range    Vitamin D (25-OH) Total 44 30 - 80 ng/mL   MAMMO SCREEN BILAT/TOMO    Collection Time: 07/22/23  9:54 AM    Narrative    [facility]  [place]  Imaging Mammography: [place], [facility]  [address] [place] [address]  [phone]    EXAM:  MAMMO SCREEN BILAT/TOMO 07/22/23  9:54 AM     INDICATION:   Screening    COMPARISON:  Compared to:   07/04/2020 MAMMO SCREEN BILAT/TOMO/CAD  07/11/2021 MAMMO SCREEN BILAT/TOMO  07/08/2022 MAMMO SCREEN BILAT/TOMO     BREAST COMPOSITION:   There are scattered areas of fibroglandular density.         TECHNIQUE:  3-D (digital tomosynthesis) and synthetic 2-D images were obtained   bilaterally.    FINDINGS:  No suspicious abnormality is seen. There are benign post therapeutic   findings in the left breast.      Impression    :    ASSESSMENT:  Left: 2 - Benign  Right: 1 - Negative  Overall: 2 - Benign     RECOMMENDATION AND DUE DATE:  Routine Screening Mammogram in 1 Year - Bilateral  07/23/2024    Electronically signed and approved by: Areta Haber, MD 07/22/2023 10:31   AM       By my electronic signature, I attest that I have personally reviewed the   images for this examination and formulated the interpretations and   opinions expressed in this report               Assessment and Plan:  The patient is a 67 y.o. female with the following:    1.  Left grade 2, IDC (ER91-100%, PR91-100%, Her2 0+), at 12:00, dx 06/2018.  Clinical Stage IA; cT2 N0 M0.  She is now s/p lumpectomy and SLNB; pT1c N0 M0.   2. Osteopenia.     We will complete Arimidex in 10/2023, which she started in 10/2018.    -Discussed today option for BCI testing to determine benefit between years 5-10. She is agreeable to this plan and noted it will help her make an informed decision on stopping or continuing the medication. Will send the request today.     Screening mammogram 07/08/22: BIRAD 2  --Repeat mammogram was benign. Repeat 07/2024.     Bone density 09/09/22: T score -1.2  --She is scheduled for repeat BMD in February 2025.   --Continue calcium, vitamin D, and resistance exercise.     Currently she is without evidence of disease recurrence at this time.    Continue to follow with PCP for annual wellness.     RTC in six months. Discussed survivorship follow up annually; however, will continue with close monitoring every six months if continues endocrine therapy.     Bishop Limbo, APRN-NP     [facility]  Provider            Total Time Today was 30 minutes in the following activities: Preparing to see the patient, Obtaining and/or reviewing separately obtained history, Performing a medically appropriate examination and/or evaluation, Counseling and educating the patient/family/caregiver, Ordering medications, tests, or procedures, Documenting clinical information in the electronic or other health record and Independently interpreting results (not separately reported) and communicating results to the patient/family/caregiver

## 2023-07-22 NOTE — Progress Notes
Breast Cancer index order form filled out per Dr. Biagio Borg office provider request. Order submitted on specimen [id number] number on procedure date 08/17/2018.

## 2023-07-23 ENCOUNTER — Ambulatory Visit: Admit: 2023-07-23 | Discharge: 2023-07-23 | Payer: MEDICARE

## 2023-07-24 ENCOUNTER — Ambulatory Visit: Admit: 2023-07-24 | Discharge: 2023-07-24 | Payer: MEDICARE

## 2023-07-24 DIAGNOSIS — E66811 Obesity, Class I, BMI 30-34.9: Secondary | ICD-10-CM

## 2023-07-24 NOTE — Progress Notes
Primary goal of visit: Follow-up nutrition counseling visit for Intensive Behavioral Therapy for Weight Management    Nutrition Assessment of Patient:    Ht Readings from Last 1 Encounters:   03/26/23 162.6 cm (5' 4)       Wt Readings from Last 5 Encounters:   07/24/23 80.6 kg (177 lb 12.8 oz)   07/22/23 81.2 kg (179 lb)   06/24/23 81.5 kg (179 lb 9.6 oz)   04/21/23 81.4 kg (179 lb 6.4 oz)   03/26/23 85 kg (187 lb 6.4 oz)    *pt reported weight    Weight change: Lost 8 lbs since initial weight of 185 lbs on 03/24/23     Body mass index is 30.52 kg/m?Marland Kitchen  BMI Categories Adult: class I obese   Total Energy Expenditure (based on Mifflin equation x activity factor): 1370 x 1.375 = 1870 kcal  Total Calorie Prescription for weight loss: 1200-1370 kcal  Needs to promote: weight loss and maintenance  6 months: 09/24/23    Notes:     Finished IBH workshop. Something really clicked with her on not reaching for food when she is not hungry.     Thanksgiving eating went well. Allowed herself to eat a small serving of the things she really wanted.   She had a social meeting Tuesday evening where people brought treats and she felt she overate. She did not eat much leading up to this which she realized is what caused this.   Snacking around the holidays is challenging.     Made some chili and is freezing this in containers. She is doing well with cooking larger amount, eating for a couple meals, then freezing the rest.   Likes to have salad kits with chicken or salmon.     Overall going to the gym but last week has been challenging. On MWF schedule with trainer for aerobics + weights on Tues Thurs.   Walking dog 3-4x/day.       Intervention/Plan:     1. Increase self-awareness and accountability through self-monitoring.    - Follow up with RD for support      2. Consume a minimum of 5 cups of fruits/vegetables daily.   Focus on getting at least half of foods as fruits and non-starchy vegetables. Whole grains, lean meats/eggs, some dairy, nuts and seeds made up the remainder of the diet. Foods both high in calories and low in nutritive quality are consumed rarely (baked goods, fried foods, high fat/processed meats, sweets).     - for holiday treats - limit how many you bring in the home, get rid of them, keep them out of sight     3. Progress to meet recommendation for 150+ minutes of physical activity for general health, while increasing to 300+ minutes weekly for continued weight loss or increased success for weight maintenance.   - continue strength training + dragon boating  - maybe try some classes at the gym     4. Increase activities of daily living and reduce sedentary time.    Step goal: 10,000 steps/day     5. Make modifications to environment to support healthy lifestyle changes.         Nutrition Monitoring and Evaluation:     Time spent with patient: 20 minutes    Follow up in 1 month(s)    Contact information provided for questions     Thank you,    Claiborne Rigg, RD LD

## 2023-08-04 ENCOUNTER — Encounter: Admit: 2023-08-04 | Discharge: 2023-08-04 | Payer: MEDICARE

## 2023-08-04 MED ORDER — ATORVASTATIN 40 MG PO TAB
20 mg | ORAL_TABLET | Freq: Every day | ORAL | 1 refills | Status: AC
Start: 2023-08-04 — End: ?

## 2023-08-04 NOTE — Telephone Encounter
1. Received escript from pharmacy requesting new Rx for atorvastatin.    2. Last Rx written:    Disp Refills Start End     atorvastatin (LIPITOR) 40 mg tablet 45 tablet 1 02/20/2023 --    Sig - Route: TAKE 1/2 TABLET BY MOUTH EVERY DAY - Oral    Sent to pharmacy as: atorvastatin 40 mg tablet (LIPITOR)    E-Prescribing Status: Receipt confirmed by pharmacy (02/20/2023 12:31 PM CDT)        3. Last Gen Med Visit:    07/04/23    4. Next Appt due:    Return in about 1 year (around 07/03/2024), or Annual visit Medicare wellness.      Future Appointments   Date Time Provider Department Center   08/21/2023  1:00 PM Liam Rogers, RD [facility] IM   09/11/2023  3:00 PM [facility]   01/20/2024  1:30 PM O'Dea, Daneil Dan, MD [id number] [place] Exam   07/05/2024 11:30 AM Flanagin, Clide Dales, APRN-NP [facility] IM       5. All protocol criteria met? Yes

## 2023-08-08 ENCOUNTER — Encounter: Admit: 2023-08-08 | Discharge: 2023-08-08 | Payer: MEDICARE

## 2023-08-18 ENCOUNTER — Encounter: Admit: 2023-08-18 | Discharge: 2023-08-18 | Payer: MEDICARE

## 2023-08-18 DIAGNOSIS — R062 Wheezing: Secondary | ICD-10-CM

## 2023-08-18 DIAGNOSIS — G9331 Post viral syndrome: Secondary | ICD-10-CM

## 2023-08-18 MED ORDER — PREDNISONE 20 MG PO TAB
ORAL_TABLET | 0 refills
Start: 2023-08-18 — End: ?

## 2023-08-20 ENCOUNTER — Encounter: Admit: 2023-08-20 | Discharge: 2023-08-20 | Payer: MEDICARE

## 2023-08-21 ENCOUNTER — Ambulatory Visit: Admit: 2023-08-21 | Discharge: 2023-08-21 | Payer: MEDICARE

## 2023-08-21 DIAGNOSIS — E66811 Obesity, Class I, BMI 30-34.9: Secondary | ICD-10-CM

## 2023-08-21 NOTE — Progress Notes
Primary goal of visit: Follow-up nutrition counseling visit for Intensive Behavioral Therapy for Weight Management    Nutrition Assessment of Patient:    Ht Readings from Last 1 Encounters:   08/21/23 160 cm (5' 3)       Wt Readings from Last 5 Encounters:   08/21/23 80.3 kg (177 lb)   07/24/23 80.6 kg (177 lb 12.8 oz)   07/22/23 81.2 kg (179 lb)   06/24/23 81.5 kg (179 lb 9.6 oz)   04/21/23 81.4 kg (179 lb 6.4 oz)    *pt reported weight    Weight change: Lost 8 lbs since initial weight of 185 lbs on 03/24/23     Body mass index is 31.35 kg/m?Marland Kitchen  BMI Categories Adult: class I obese   Total Energy Expenditure (based on Mifflin equation x activity factor): 1370 x 1.375 = 1870 kcal  Total Calorie Prescription for weight loss: 1200-1370 kcal  Needs to promote: weight loss and maintenance  6 months: 09/24/23    Notes:     The rest of the holiday season eating went overall well. Had 2 celebratory meals. Not a whole lot of sweets.   She did have her grandkids stay with her for a bit after and they have different palates but she still felt things went well. Practicing good portion control, especially with any dining out.     She did get some fritos to have with chili. These types of chips + cheezits + crackers and etc.     Got some Lean Cuisines. Surprisingly more filling than she thought.     Less PA with holidays + family in town + weather. She does have some weights + bands at home.   She does get up at least every hour and get about 10 min of movement in when her watch notifies her.       Intervention/Plan:     1. Increase self-awareness and accountability through self-monitoring.    - Follow up with RD for support      2. Consume a minimum of 5 cups of fruits/vegetables daily.   Focus on getting at least half of foods as fruits and non-starchy vegetables. Whole grains, lean meats/eggs, some dairy, nuts and seeds made up the remainder of the diet. Foods both high in calories and low in nutritive quality are consumed rarely (baked goods, fried foods, high fat/processed meats, sweets).     - for challenge foods - remind yourself you are allowed to enjoy these but still need to limit frequency + amount; gage your hunger level first; portion small amount out; have with balanced meal or snack     3. Progress to meet recommendation for 150+ minutes of physical activity for general health, while increasing to 300+ minutes weekly for continued weight loss or increased success for weight maintenance.   - continue strength training + dragon boating  - maybe try some classes at the gym      4. Increase activities of daily living and reduce sedentary time.    Step goal: 10,000 steps/day  - continue honoring stand goals      5. Make modifications to environment to support healthy lifestyle changes.       Nutrition Monitoring and Evaluation:     Time spent with patient: 20 minutes    Follow up in 1 month(s)    Contact information provided for questions     Thank you,    Claiborne Rigg, RD LD

## 2023-08-21 NOTE — Patient Instructions
1. Increase self-awareness and accountability through self-monitoring.    - Follow up with RD for support      2. Consume a minimum of 5 cups of fruits/vegetables daily.   Focus on getting at least half of foods as fruits and non-starchy vegetables. Whole grains, lean meats/eggs, some dairy, nuts and seeds made up the remainder of the diet. Foods both high in calories and low in nutritive quality are consumed rarely (baked goods, fried foods, high fat/processed meats, sweets).     - for challenge foods - remind yourself you are allowed to enjoy these but still need to limit frequency + amount; gage your hunger level first; portion small amount out; have with balanced meal or snack     3. Progress to meet recommendation for 150+ minutes of physical activity for general health, while increasing to 300+ minutes weekly for continued weight loss or increased success for weight maintenance.   - continue strength training + dragon boating  - maybe try some classes at the gym      4. Increase activities of daily living and reduce sedentary time.    Step goal: 10,000 steps/day  - continue honoring stand goals

## 2023-09-04 NOTE — Patient Instructions
Thank you for coming to see [place] today.   Please call our office or send a message through MyChart if you have any questions or concerns.          Dr. Dellia Cloud and Daun Peacock, APRN   [phone] (Nurses: Esperanza Sheets Crimi/Jerilyn DeRee)  [phone] (fax)    [facility] Location  [address] [place], [place] [address]    [facility] Location  [address] [place]  [place], [place] [address]                                                                                                   Thank you for coming to see [place] today.   Please call our office or send a message through MyChart if you have any questions or concerns.          Dr. Dellia Cloud and Daun Peacock, APRN   [phone] (Nurses: Esperanza Sheets Crimi/Jerilyn DeRee)  [phone] (fax)    [facility] Location  [address] [place], [place] [address]    [facility] Location  [address] [place]  [place], [place] [address]

## 2023-09-11 ENCOUNTER — Encounter: Admit: 2023-09-11 | Discharge: 2023-09-11 | Payer: MEDICARE

## 2023-09-11 ENCOUNTER — Ambulatory Visit: Admit: 2023-09-11 | Discharge: 2023-09-12 | Payer: MEDICARE

## 2023-09-11 ENCOUNTER — Ambulatory Visit: Admit: 2023-09-11 | Discharge: 2023-09-11 | Payer: MEDICARE

## 2023-09-11 DIAGNOSIS — G4733 Obstructive sleep apnea (adult) (pediatric): Secondary | ICD-10-CM

## 2023-09-11 DIAGNOSIS — E669 Obesity, unspecified: Secondary | ICD-10-CM

## 2023-09-11 MED ORDER — METFORMIN 500 MG PO TB24
1500 mg | ORAL_TABLET | Freq: Every day | ORAL | 1 refills | Status: AC
Start: 2023-09-11 — End: ?

## 2023-09-11 NOTE — Progress Notes
[facility]  Follow Up Visit    Date of Service: 09/11/2023  PCP: Dimitri Ped  DOB: 1956-02-20  MRN#: [id number]    Alicia Sharp is a 68 y.o. female.        History of Present Illness  Alicia Sharp is a 68 y.o. female with  has a past medical history of Allergy, Arthritis, Back pain (6-24), Breast cancer (HCC) (07/2018), Hormone replacement therapy, Hyperlipidemia, Obesity, Obstructive sleep apnea (last month), Osteoarthritis (06/2013), Personal history of irradiation, Seasonal allergic reaction, and Seasonal allergies. presenting for follow up discussion of obesity and weight management.    Nutrition: Marranda is following with Jae Dire in [place]. She's given her several helpful suggestions for increasing her protein intake, strategies to use when eating out, and ideas for snacks.     Activity: continues to exercise 5 days a week, both a combination of strength training and cardio. 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     Medication: Metformin ER 1500 mg. She has some intermittent GI cramping loose stools, but says that this is overall tolerable.       Review of Systems   Constitution: Positive for weight loss.  GI: positive for diarrhea and cramping     Objective:     Physical Exam  Constitutional: Well developed, well nourished, no distress   HENT: Normocephalic, atraumatic  Respiratory: Normal effort  Neurologic: Alert  Psych: mood /affect normal. Behavior normal. Thought content normal. Judgement normal.    Medications-   albuterol sulfate (PROAIR HFA) 90 mcg/actuation HFA aerosol inhaler Inhale two puffs by mouth into the lungs every 6 hours as needed for Wheezing or Shortness of Breath.    anastrozole (ARIMIDEX) 1 mg tablet TAKE ONE TABLET BY MOUTH EVERY DAY    atorvastatin (LIPITOR) 40 mg tablet TAKE 1/2 TABLET BY MOUTH EVERY DAY    budesonide-formoterol HFA (SYMBICORT) 80-4.5 mcg/actuation aerosol inhaler Inhale two puffs by mouth into the lungs twice daily. calcium carbonate (TUMS PO) Take  by mouth. Twice a day    CHOLEcalciferoL (vitamin D3) (VITAMIN D3) 5000 unit tablet Take one tablet by mouth.    cyanocobalamin (vitamin B-12) (VITAMIN B-12) 1,000 mcg tablet Take one tablet by mouth daily. Indications: prevention of vitamin B12 deficiency    fexofenadine (ALLEGRA) 180 mg tablet Take one tablet by mouth daily.    fluticasone propionate (FLONASE ALLERGY RELIEF) 50 mcg/actuation nasal spray, suspension Apply two sprays to each nostril as directed twice daily.    meloxicam (MOBIC) 7.5 mg tablet TAKE ONE TABLET BY MOUTH DAILY    metFORMIN-XR (GLUCOPHAGE XR) 500 mg extended release tablet Take three tablets by mouth daily with dinner.    ONE DAILY MULTIVITAMIN PO      Recent Labs:  Basic Metabolic Profile    Lab Results   Component Value Date/Time    NA 138 07/08/2023 01:44 PM    K 3.9 07/08/2023 01:44 PM    CA 8.9 07/08/2023 01:44 PM    CL 104 07/08/2023 01:44 PM    CO2 27 07/08/2023 01:44 PM    GAP 8 07/08/2023 01:44 PM    Lab Results   Component Value Date/Time    BUN 21 07/08/2023 01:44 PM    CR 0.75 07/08/2023 01:44 PM    GLU 83 07/08/2023 01:44 PM        Hepatic Function    Lab Results   Component Value Date/Time    ALBUMIN 4.1 07/08/2023 01:44 PM  TOTPROT 6.9 07/08/2023 01:44 PM    ALKPHOS 80 07/08/2023 01:44 PM    Lab Results   Component Value Date/Time    AST 19 07/08/2023 01:44 PM    ALT 16 07/08/2023 01:44 PM    TOTBILI 0.3 07/08/2023 01:44 PM        TSH   Lab Results   Component Value Date/Time    TSH 1.14 12/25/2020 09:14 AM      Lab Draw:  Lab Results   Component Value Date/Time    HGBA1C 5.4 07/08/2023 01:44 PM     POC:  Lab Results   Component Value Date/Time    A1C 5.3 10/23/2020 12:00 AM    A1C 5.3 01/18/2019 12:00 AM      Lab Results   Component Value Date    CHOL 138 07/08/2023    TRIG 193 (H) 07/08/2023    HDL 53 07/08/2023    LDL 75 07/08/2023    VLDL 82.9 07/08/2023    NONHDLCHOL 85 07/08/2023      Wt Readings from Last 5 Encounters:   08/21/23 80.3 kg (177 lb)   07/24/23 80.6 kg (177 lb 12.8 oz)   07/22/23 81.2 kg (179 lb)   06/24/23 81.5 kg (179 lb 9.6 oz)   04/21/23 81.4 kg (179 lb 6.4 oz)     BP Readings from Last 3 Encounters:   07/22/23 119/49   07/04/23 103/59   03/26/23 132/58      Telehealth Patient Reported Vitals       Row Name 09/11/23 1258                Weight: 80.8 kg (178 lb 3.2 oz)        Height: 160 cm (5' 3)                       Telehealth Body Mass Index: [phone] at 09/11/2023  1:04 PM         Assessment and Plan:  Obesity-   Initial weight & BMI: 187 #  Telehealth Body Mass Index: 32.17 at 02/21/2023 12:51 PM  Today's weight & BMI: 178,Telehealth Body Mass Index: [phone] at 09/11/2023  1:04 PM  Total weight loss to date:  9 #,  4.8 %TBWL     Treatment:  Plan/ level of monitoring: Nutrition Counseling and Pharmacotherapy    Current Medication - Continue Metformin 1500. Discussed decreasing to 1000 mg to see if she might tolerate it better from a GI perspective. We also briefly talked about Zepbound's approval to treat OSA, at this time with the successful weight loss she's had and positive changes she's made with nutrition, she's not wanting to pursue GLP-1 therapy. We will follow up in 6 months     Failed medication - none  Discussion of safety/side effects -reviewed     Further obesity management recommendations:   Annual screen for a assessment of Type II diabetes, HTN, Hyperlipidemia, Depression, OSA, NAFLD, Vitamin D deficiency and renal disease.   Given the association of obesity with cancer, all age appropriate cancer screenings should be UTD  Medication Review: If possible medication that promote weight gain should be avoided and medication that are weight-neutral or promote weight loss should be considered.     Comorbid Conditions:  HLD  H/o breast cancer  OSA    Total Time Today was 20 minutes in the following activities: Preparing to see the patient, Obtaining and/or reviewing separately obtained history, Performing a medically appropriate examination  and/or evaluation, Counseling and educating the patient/family/caregiver, Ordering medications, tests, or procedures, and Documenting clinical information in the electronic or other health record      Future Appointments   Date Time Provider Department Center   09/11/2023  1:10 PM Marthe Patch, MD [facility]   09/11/2023  3:00 PM [facility] [place]   09/12/2023  2:30 PM Bishop Limbo, APRN-NP [facility] [place] Exam   01/20/2024  1:30 PM O'Dea, Daneil Dan, MD [id number] [place] Exam   07/05/2024 11:30 AM Flanagin, Clide Dales, APRN-NP [facility]

## 2023-09-12 ENCOUNTER — Encounter: Admit: 2023-09-12 | Discharge: 2023-09-12 | Payer: MEDICARE

## 2023-09-12 DIAGNOSIS — C50412 Malignant neoplasm of upper-outer quadrant of left female breast: Secondary | ICD-10-CM

## 2023-09-12 NOTE — Progress Notes
Name: Alicia Sharp          MRN: [id number]      DOB: [birth year]      AGE: 68 y.o.   DATE OF SERVICE: 09/12/2023             Reason for Visit:  Follow Up       Cancer Staging   Malignant neoplasm of upper-outer quadrant of left breast in female, estrogen receptor positive (HCC)  Staging form: Breast, AJCC 8th Edition  - Clinical: Stage IA (cT1c, cN0, cM0, G2, ER+, PR+, HER2-) - Signed by Carylon Perches, APRN on 08/25/2018  - Pathologic: Stage IA (pT1c, pN0(sn), cM0, G2, ER+, PR+, HER2-) - Signed by Carylon Perches, APRN on 08/25/2018      Alicia Sharp is a 68 y.o. female who presented to the [facility] on 07/16/2018 at age 53 for evaluation of her left breast cancer. An abnormality was noted on screening mammogram. Left breast biopsy 07/02/18 revealed a grade 2, IDC at 12:00. S/p left lumpectomy/SLNB. She reports that she tolerated surgery generally well.  Completed adjuvant radiation.     Current Therapy:  Arimidex    History of Present Illness:    The patient presents for a follow-up visit.    She is tolerating anastrozole well.     She is following with PCP but currently setting up with a [facility] provider at [facility].     She is anxious about stopping the endocrine therapy in March. Noted her OncotypeDX had noted a recurrence score but that was projected at nine years and worried about stopping at five years.  She has known others to have recurrence of disease many years out and worried what it would mean to stop too early. BCI test was performed and reviewed.     Was seen in December and then started having pain in the axillary region. Isn't really pain but more tightness and especially when raises the arm and reaches back. Noted area to be tender to touch at times. Not sharp. Comes and goes. No swelling in the arm. She noted pain started nearly a month after the mammogram.         Review of Systems   HENT:  Negative for mouth sores.    Eyes:  Negative for visual disturbance.   Respiratory:  Negative for shortness of breath.    Cardiovascular:  Negative for chest pain.   Gastrointestinal:  Negative for blood in stool, constipation and diarrhea.   Musculoskeletal:  Positive for arthralgias.   Skin:  Negative for rash.   Neurological:  Negative for headaches.   Psychiatric/Behavioral:  Negative for sleep disturbance.          Past Medical History:    Allergy    Arthritis    Back pain    Breast cancer (HCC)    Hormone replacement therapy    Hyperlipidemia    Obesity    Obstructive sleep apnea    Osteoarthritis    Personal history of irradiation    Seasonal allergic reaction    Seasonal allergies       Surgical History:   Procedure Laterality Date    LEFT RADIOACTIVE SEED LOCALIZED LUMPECTOMY Left 08/17/2018    Performed by Ignatius Specking, MD at [facility] OR    IDENTIFICATION SENTINEL LYMPH NODE Left 08/17/2018    Performed by Ignatius Specking, MD at [facility]    INJECTION RADIOACTIVE TRACER FOR SENTINEL NODE IDENTIFICATION Left 08/17/2018  Performed by Ignatius Specking, MD at [facility]    LEFT SENTINEL LYMPH NODE BIOPSY Left 08/17/2018    Performed by Ignatius Specking, MD at [facility]    COLONOSCOPY DIAGNOSTIC WITH SPECIMEN COLLECTION BY BRUSHING/ WASHING - FLEXIBLE N/A 04/07/2019    Performed by Onnie Boer, MD at [facility]    BREAST SURGERY  1-20    lumpectomy    HX ROTATOR CUFF REPAIR      R 08/2010. L 09/2015.    HX WISDOM TEETH EXTRACTION      KNEE ARTHROSCOPY Left     2014    KNEE SURGERY  06/2013    L knee    SHOULDER SURGERY  R rotator 08/2010, L rotator2/2018        Family History   Problem Relation Name Age of Onset    Migraines Mother Adreana Coull     Bladder Cancer Mother Flornce Record     High Cholesterol Mother Farrie Sann     Hypertension Mother Alie Moudy     Cancer Mother Murna Backer         bladder    Arthritis Mother Naysha Sholl     Heart Disease Father Citlalic Norlander     Stroke Father Breck Coons     Coronary Artery Disease Father Breck Coons     High Cholesterol Father Breck Coons     Hypertension Father Breck Coons     Cancer-Breast Maternal Aunt Judy         30s    Cancer-Prostate Maternal Grandfather [place]     Migraines Maternal Grandfather [place]     Stroke Maternal Grandmother [place] [id number]     Anemia Brother Viviann Spare     Arthritis-osteo Mother Tacey Dimaggio         Social History     Socioeconomic History    Marital status: Widowed    Number of children: 2   Occupational History    Occupation: retired   Tobacco Use    Smoking status: Never    Smokeless tobacco: Never    Tobacco comments:     briefly in college   Substance and Sexual Activity    Alcohol use: Yes     Alcohol/week: 4.0 standard drinks of alcohol     Types: 4 Glasses of wine per week     Comment: 1 glass of wine/evening    Drug use: Never    Sexual activity: Not Currently     Partners: Male     Birth control/protection: Post-menopausal   Social History Narrative    Here with daughter Denny Peon          Objective:     No Known Allergies         albuterol sulfate (PROAIR HFA) 90 mcg/actuation HFA aerosol inhaler Inhale two puffs by mouth into the lungs every 6 hours as needed for Wheezing or Shortness of Breath.    anastrozole (ARIMIDEX) 1 mg tablet TAKE ONE TABLET BY MOUTH EVERY DAY    atorvastatin (LIPITOR) 40 mg tablet TAKE 1/2 TABLET BY MOUTH EVERY DAY    budesonide-formoterol HFA (SYMBICORT) 80-4.5 mcg/actuation aerosol inhaler Inhale two puffs by mouth into the lungs twice daily.    calcium carbonate (TUMS PO) Take  by mouth. Twice a day    CHOLEcalciferoL (vitamin D3) (VITAMIN D3) 5000 unit tablet Take one tablet by mouth.    cyanocobalamin (vitamin B-12) (VITAMIN B-12) 1,000 mcg tablet Take one tablet by mouth  daily. Indications: prevention of vitamin B12 deficiency    fexofenadine (ALLEGRA) 180 mg tablet Take one tablet by mouth daily.    fluticasone propionate (FLONASE ALLERGY RELIEF) 50 mcg/actuation nasal spray, suspension Apply two sprays to each nostril as directed twice daily. meloxicam (MOBIC) 7.5 mg tablet TAKE ONE TABLET BY MOUTH DAILY    metFORMIN-XR (GLUCOPHAGE XR) 500 mg extended release tablet Take three tablets by mouth daily with dinner.    ONE DAILY MULTIVITAMIN PO        Vitals:    09/12/23 1416   BP: 122/57   BP Source: Arm, Right Upper   Pulse: 76   Temp: 36.4 ?C (97.6 ?F)   Resp: 16   SpO2: 96%   TempSrc: Temporal   PainSc: Two   Weight: Comment: pt denied getting weight       There is no height or weight on file to calculate BMI.     Pain Score: Two  Pain Loc: Breast (left Side)    Fatigue Scale: 0-None    Pain Addressed:  N/A    Patient Evaluated for a Clinical Trial: No treatment clinical trial available for this patient.     [place] Cooperative Oncology Group performance status is 0, Fully active, able to carry on all pre-disease performance without restriction.Marland Kitchen     Physical Exam  Vitals reviewed.   Constitutional:       Appearance: Normal appearance. She is well-developed.   HENT:      Head: Normocephalic and atraumatic.      Mouth/Throat:      Mouth: Mucous membranes are moist. No oral lesions.   Eyes:      General: No scleral icterus.  Cardiovascular:      Rate and Rhythm: Normal rate.   Pulmonary:      Effort: Pulmonary effort is normal. No respiratory distress.   Chest:   Breasts:     Right: No inverted nipple, mass, nipple discharge, skin change or tenderness.      Left: No inverted nipple, mass, nipple discharge, skin change or tenderness.       Abdominal:      General: There is no distension.      Tenderness: There is no abdominal tenderness. There is no guarding.   Musculoskeletal:      Cervical back: Neck supple.      Right lower leg: No edema.      Left lower leg: No edema.   Lymphadenopathy:      Upper Body:      Right upper body: No supraclavicular or axillary adenopathy.      Left upper body: No supraclavicular or axillary adenopathy.   Skin:     General: Skin is warm and dry.   Neurological:      Mental Status: She is alert and oriented to person, place, and time.   Psychiatric:         Mood and Affect: Mood normal.         Behavior: Behavior normal.                Recent Results (from the past 4 weeks)   BONE DENSITY SPINE/HIP    Collection Time: 09/11/23  3:06 PM    Narrative    BONE DENSITOMETRY     CLINICAL INDICATION: Osteopenia follow-up, history of breast cancer with aromatase inhibitor use.    COMPARISON: 09/09/2022    FINDINGS: DEXA scan of the lumbar spine and bilateral hips was performed. FRAX score was calculated  from patient reported risk factors and femoral neck bone density.    LUMBAR SPINE, L1-L4  Current: 1.143 g/cm2, T-score of -0.4       Previous: 1.133 g/cm2, T-score of -0.5    LEFT FEMORAL NECK    Current: 0.878 g/cm2, T-score of -1.1       Previous: 0.871 g/cm2, T-score of -1.2    LEFT TOTAL HIP    Current: 0.946 g/cm2, T-score of -0.5   Previous: 0.947 g/cm2, T-score of -0.5    RIGHT FEMORAL NECK  Current: 0.871 g/cm2, T-score of -1.2       Previous: 0.880 g/cm2, T-score of -1.1    RIGHT TOTAL HIP  Current: 0.940 g/cm2, T-score of -0.5   Previous: 0.928 g/cm2, T-score of -0.6      FRAX SCORE   10 year risk hip fracture = 1.2%   10 year risk major osteoporotic fracture = 11.1%    WHO Criteria for Diagnosis of Osteoporosis (T-score)*  Normal (-1.0 and above)  Low bone mass, referred to as osteopenia (Between -1.0 and -2.5)  Osteoporosis (-2.5 and below)    *Based on region with lowest bone mineral density.       Impression    Statistically stable bone mineral density with persistent low bone mass (osteopenia).    General comments regarding interpretation of bone mineral density measurements:    a) Consider FDA-approved medical therapies in the setting of 1) Hip or vertebral fracture, 2) Osteoporosis, and 3) low bone mass, referred to as osteopenia, with FRAX score of greater than or equal to 3% for hip fracture or greater than or equal to 20% for major osteoporotic fracture. Treatment may also be indicated based on clinical judgement and/or patient preference.     b) Interval between BMD testing should be determined according to each patient's clinical status: typically one year after initiation or change of therapy is appropriate, with longer intervals once therapeutic effect is established. In conditions with rapid bone loss, such as glucocorticoid therapy, testing more frequently is appropriate.     By my electronic signature, I attest that I have personally reviewed the images for this examination and formulated the interpretations and opinions expressed in this report       Finalized by Particia Jasper, M.D. on 09/11/2023 3:41 PM. Dictated by Cherre Huger, MD on 09/11/2023 3:15 PM.             Assessment and Plan:  The patient is a 68 y.o. female with the following:    1.  Left grade 2, IDC (ER91-100%, PR91-100%, Her2 0+), at 12:00, dx 06/2018.  Clinical Stage IA; cT2 N0 M0.  She is now s/p lumpectomy and SLNB; pT1c N0 M0.   2. Osteopenia.     We will complete Arimidex in 10/2023, which she started in 10/2018.    -BCI performed and noted benefit for ongoing therapy with risk of distant recurrance at 5 years predicted to be 3.9% and can be lowered to 1.3 to 1.7% with additional five years of therapy. She is tolerating the medication well and plans to continue.     Screening mammogram 07/22/23: BIRAD 2  --Repeat in one year  --Discussed adding left axillary sono for ongoing new axillary pain. If negative consider PT evaluation.     Bone density 09/09/22: T score -1.2  --She is scheduled for repeat BMD in February 2025.   --Continue calcium, vitamin D, and resistance exercise.     Continue to follow with PCP for annual  wellness.     RTC as scheduled.      Bishop Limbo, APRN-NP     [facility] Collaborating Provider        Total Time Today was 30 minutes in the following activities: Preparing to see the patient, Obtaining and/or reviewing separately obtained history, Performing a medically appropriate examination and/or evaluation, Counseling and educating the patient/family/caregiver, Ordering medications, tests, or procedures, Documenting clinical information in the electronic or other health record and Independently interpreting results (not separately reported) and communicating results to the patient/family/caregiver

## 2023-09-18 ENCOUNTER — Ambulatory Visit: Admit: 2023-09-18 | Discharge: 2023-09-18 | Payer: MEDICARE

## 2023-09-18 ENCOUNTER — Encounter: Admit: 2023-09-18 | Discharge: 2023-09-18 | Payer: MEDICARE

## 2023-09-18 DIAGNOSIS — E66811 Obesity, Class I, BMI 30-34.9: Secondary | ICD-10-CM

## 2023-09-18 NOTE — Patient Instructions
-   dining out - see handout; stick to grille/steamed/roasted items + ask for sauces on the side   - consider prepping fruit/veg or buying precut

## 2023-09-26 ENCOUNTER — Ambulatory Visit: Admit: 2023-09-26 | Discharge: 2023-09-27 | Payer: MEDICARE

## 2023-09-26 ENCOUNTER — Encounter: Admit: 2023-09-26 | Discharge: 2023-09-26 | Payer: MEDICARE

## 2023-09-26 DIAGNOSIS — C50412 Malignant neoplasm of upper-outer quadrant of left female breast: Secondary | ICD-10-CM

## 2023-09-26 DIAGNOSIS — M79622 Pain in left upper arm: Secondary | ICD-10-CM

## 2023-10-03 ENCOUNTER — Encounter: Admit: 2023-10-03 | Discharge: 2023-10-03 | Payer: MEDICARE

## 2023-10-03 ENCOUNTER — Encounter: Admit: 2023-10-03 | Discharge: 2023-10-10 | Payer: MEDICARE

## 2023-10-20 ENCOUNTER — Encounter: Admit: 2023-10-20 | Discharge: 2023-10-20 | Payer: MEDICARE

## 2023-10-24 ENCOUNTER — Encounter: Admit: 2023-10-24 | Discharge: 2023-10-24 | Payer: MEDICARE

## 2023-10-27 ENCOUNTER — Encounter: Admit: 2023-10-27 | Discharge: 2023-10-27 | Payer: MEDICARE

## 2023-10-30 ENCOUNTER — Ambulatory Visit: Admit: 2023-10-30 | Discharge: 2023-10-30 | Payer: MEDICARE

## 2023-10-30 ENCOUNTER — Encounter: Admit: 2023-10-30 | Discharge: 2023-10-30 | Payer: MEDICARE

## 2023-10-30 DIAGNOSIS — E66811 Obesity, Class I, BMI 30-34.9: Secondary | ICD-10-CM

## 2023-10-30 NOTE — Progress Notes
 Primary goal of visit: Follow-up nutrition counseling visit for Intensive Behavioral Therapy for Weight Management    Nutrition Assessment of Patient:    Ht Readings from Last 1 Encounters:   08/21/23 160 cm (5' 3)       Wt Readings from Last 5 Encounters:   10/30/23 80.3 kg (177 lb)   09/18/23 80.3 kg (177 lb)   08/21/23 80.3 kg (177 lb)   07/24/23 80.6 kg (177 lb 12.8 oz)   07/22/23 81.2 kg (179 lb)    *pt reported weight    Weight change: Lost 8 lbs since initial weight of 185 lbs on 03/24/23     Body mass index is 31.35 kg/m?Marland Kitchen  BMI Categories Adult: class I obese   Total Energy Expenditure (based on Mifflin equation x activity factor): 1370 x 1.375 = 1870 kcal  Total Calorie Prescription for weight loss: 1200-1370 kcal  Needs to promote: weight loss and maintenance  6 months: 09/24/23    Notes:     Did an assessment at [facility] and found her muscle mass has decreased. Working with a Psychologist, educational, going to increase what she has been doing for workouts. Otherwise PA + steps have been stable. Doing yard work.    Nutrition hit or miss with lack of planning. She does best with preparing 2-3 entrees + sides on the weekend for the week ahead.  Breakfast gets delayed, lunch gets delayed, then doesn't really eat dinner. Might have ice cream or protein drink at dinner time.   A couple nights last week did not sleep well.     Drinks - 1-2 cups coffee a day, might get a latte.    Intervention/Plan:     1. Increase self-awareness and accountability through self-monitoring.    - Follow up with RD for support      2. Consume a minimum of 5 cups of fruits/vegetables daily.   Focus on getting at least half of foods as fruits and non-starchy vegetables. Whole grains, lean meats/eggs, some dairy, nuts and seeds made up the remainder of the diet. Foods both high in calories and low in nutritive quality are consumed rarely (baked goods, fried foods, high fat/processed meats, sweets).     - dining out - see handout; stick to grilled/steamed/roasted items + ask for sauces on the side   - for mornings you are sleeping in, eat breakfast, have smaller meal for lunch, then regular dinner time to avoid a lower protein meal closer to bed time  - consider meal service for moving - heat & eat meal services: [employer], [employer], Fresh [employer], [employer], [employer], [employer]     3. Progress to meet recommendation for 150+ minutes of physical activity for general health, while increasing to 300+ minutes weekly for continued weight loss or increased success for weight maintenance.   - continue strength training + dragon boating  - maybe try some classes at the gym      4. Increase activities of daily living and reduce sedentary time.    Step goal: 10,000 steps/day  - continue honoring stand goals      5. Make modifications to environment to support healthy lifestyle changes.       Nutrition Monitoring and Evaluation:     Time spent with patient: 20 minutes    Follow up in 1 month(s)    Contact information provided for questions     Thank you,    Claiborne Rigg, RD LD

## 2023-10-30 NOTE — Patient Instructions
-   dining out - see handout; stick to grilled/steamed/roasted items + ask for sauces on the side   - for mornings you are sleeping in, eat breakfast, have smaller meal for lunch, then regular dinner time to avoid a lower protein meal closer to bed time  - consider meal service for moving - heat & eat meal services: [employer], [employer], [employer], [employer], [employer], [employer]

## 2023-11-03 ENCOUNTER — Encounter: Admit: 2023-11-03 | Discharge: 2023-11-03 | Payer: MEDICARE

## 2023-11-17 ENCOUNTER — Encounter: Admit: 2023-11-17 | Discharge: 2023-11-17

## 2023-11-17 ENCOUNTER — Ambulatory Visit: Admit: 2023-11-17 | Discharge: 2023-11-18

## 2023-11-17 DIAGNOSIS — Z0184 Encounter for antibody response examination: Secondary | ICD-10-CM

## 2023-11-18 ENCOUNTER — Encounter: Admit: 2023-11-18 | Discharge: 2023-11-18

## 2023-11-18 MED ORDER — MELOXICAM 7.5 MG PO TAB
7.5 mg | ORAL_TABLET | Freq: Every day | ORAL | 1 refills | 30.00000 days | Status: AC
Start: 2023-11-18 — End: ?

## 2023-11-18 MED ORDER — ANASTROZOLE 1 MG PO TAB
1 mg | ORAL_TABLET | Freq: Every day | ORAL | 1 refills | 33.00000 days | Status: AC
Start: 2023-11-18 — End: ?

## 2023-11-18 NOTE — Telephone Encounter
 1. Received e-request from pharmacy requesting new Rx for   Requested Prescriptions     Pending Prescriptions Disp Refills    meloxicam (MOBIC) 7.5 mg tablet [Pharmacy Med Name: meloxicam 7.5 mg tablet] 90 tablet 1     Sig: TAKE ONE TABLET BY MOUTH DAILY   .    2. Last Rx written: 05/09/23  (#90 x1) by PCP    3. Last Gen Med Visit: in person  on 07/04/23    4. Next Appt due: Provider recommended return date from last office visit: Return in about 1 year (around 07/03/2024)     Future Appointments   Date Time Provider Department Center   11/24/2023  9:50 AM Bearl Limes, MD [facility] IM   11/24/2023  2:00 PM Donnamarie Gables [facility]   12/01/2023  1:20 PM Russ Course, RD [facility] IM   01/20/2024  1:30 PM O'Dea, Quin Brush, MD [id number]  Exam   07/05/2024 11:30 AM Flanagin, Danielle M, APRN-NP [facility]       5. All protocol criteria met? No, see below     Reason Protocol Failed: Abnormal labs- results and provider result notes included for review. Routing to provider for approval.  and Non-delegated medication, routing to provider for approval.     -+

## 2023-11-18 NOTE — Telephone Encounter
 Per office visit note from 08/2023, patient is to continue on this medication. Follow up visit with O'Dea office is appropriately scheduled. Will send in refill to pharmacy as requested.

## 2023-11-24 ENCOUNTER — Encounter: Admit: 2023-11-24 | Discharge: 2023-11-24 | Payer: MEDICARE

## 2023-11-24 ENCOUNTER — Ambulatory Visit: Admit: 2023-11-24 | Discharge: 2023-11-24 | Payer: MEDICARE

## 2023-11-24 DIAGNOSIS — E669 Obesity, unspecified: Secondary | ICD-10-CM

## 2023-11-24 DIAGNOSIS — G4733 Obstructive sleep apnea (adult) (pediatric): Secondary | ICD-10-CM

## 2023-11-24 NOTE — Progress Notes
 [place]  Weight Management  Follow Up Visit    Date of Service: 11/24/2023  PCP: Norvell Beers  DOB: 26-Jun-1956  MRN#: [id number]    Alicia Sharp is a 68 y.o. female.        History of Present Illness  Alicia Sharp is a 68 y.o. female with  has a past medical history of Allergy, Arthritis, Back pain (6-24), Breast cancer (CMS-HCC) (07/2018), Hormone replacement therapy, Hyperlipidemia, Joint pain (Nov. 2014), Obesity, Obstructive sleep apnea (last month), Osteoarthritis (06/2013), Other malignant neoplasm without specification of site (Nov. 2019), Personal history of irradiation, Seasonal allergic reaction, and Seasonal allergies. presenting for follow up discussion of obesity and weight management.       Maha continues to follow with Polly Brink in [place]. She has been focusing on nutrition, particularly increasing protein intake, and has been using protein supplements like [employer]. She struggles with consuming fruits and vegetables, especially when they are not fresh and in season.  Her home weight is 175 pounds, down from 177 pounds in March.    She has been taking metformin and reduced the dose from three to two tablets, which alleviated some gastrointestinal side effects but resulted in a weight plateau.    She is actively engaged in physical activity, including cardio and strength training, and has recently resumed rowing. She reports being consistent with exercise and has increased the intensity of her workouts.          Review of Systems   Constitution: Positive for weight loss.  GI: negative for GI upset    Objective:     Physical Exam  Constitutional: Well developed, well nourished, no distress   HENT: Normocephalic, atraumatic  Respiratory: Normal effort  Neurologic: Alert  Psych: mood /affect normal. Behavior normal. Thought content normal. Judgement normal.    Medications-   anastrozole (ARIMIDEX) 1 mg tablet TAKE ONE TABLET BY MOUTH EVERY DAY    atorvastatin (LIPITOR) 40 mg tablet TAKE 1/2 TABLET BY MOUTH EVERY DAY    calcium carbonate (TUMS PO) Take  by mouth. Twice a day    CHOLEcalciferoL (vitamin D3) (VITAMIN D3) 5000 unit tablet Take one tablet by mouth.    cyanocobalamin (vitamin B-12) (VITAMIN B-12) 1,000 mcg tablet Take one tablet by mouth daily. Indications: prevention of vitamin B12 deficiency    fexofenadine (ALLEGRA) 180 mg tablet Take one tablet by mouth daily.    fluticasone propionate (FLONASE ALLERGY RELIEF) 50 mcg/actuation nasal spray, suspension Apply two sprays to each nostril as directed twice daily.    meloxicam (MOBIC) 7.5 mg tablet TAKE ONE TABLET BY MOUTH DAILY    metFORMIN-XR (GLUCOPHAGE XR) 500 mg extended release tablet Take three tablets by mouth daily with dinner.    ONE DAILY MULTIVITAMIN PO      Recent Labs:  Basic Metabolic Profile    Lab Results   Component Value Date/Time    NA 138 07/08/2023 01:44 PM    K 3.9 07/08/2023 01:44 PM    CA 8.9 07/08/2023 01:44 PM    CL 104 07/08/2023 01:44 PM    CO2 27 07/08/2023 01:44 PM    GAP 8 07/08/2023 01:44 PM    Lab Results   Component Value Date/Time    BUN 21 07/08/2023 01:44 PM    CR 0.75 07/08/2023 01:44 PM    GLU 83 07/08/2023 01:44 PM        Hepatic Function    Lab Results   Component Value Date/Time    ALBUMIN 4.1  07/08/2023 01:44 PM    TOTPROT 6.9 07/08/2023 01:44 PM    ALKPHOS 80 07/08/2023 01:44 PM    Lab Results   Component Value Date/Time    AST 19 07/08/2023 01:44 PM    ALT 16 07/08/2023 01:44 PM    TOTBILI 0.3 07/08/2023 01:44 PM        TSH   Lab Results   Component Value Date/Time    TSH 1.14 12/25/2020 09:14 AM      Lab Draw:  Lab Results   Component Value Date/Time    HGBA1C 5.4 07/08/2023 01:44 PM     POC:  Lab Results   Component Value Date/Time    A1C 5.3 10/23/2020 12:00 AM    A1C 5.3 01/18/2019 12:00 AM      Lab Results   Component Value Date    CHOL 138 07/08/2023    TRIG 193 (H) 07/08/2023    HDL 53 07/08/2023    LDL 75 07/08/2023    VLDL 95.6 07/08/2023    NONHDLCHOL 85 07/08/2023      Wt Readings from Last 5 Encounters:   11/24/23 82.6 kg (182 lb)   10/30/23 80.3 kg (177 lb)   09/18/23 80.3 kg (177 lb)   08/21/23 80.3 kg (177 lb)   07/24/23 80.6 kg (177 lb 12.8 oz)     BP Readings from Last 3 Encounters:   11/24/23 129/58   09/12/23 122/57   07/22/23 119/49      Telehealth Patient Reported Vitals       Row Name 11/24/23 0949                Resp 16                       Telehealth Body Mass Index: [id number] at 11/24/2023  9:54 AM         Assessment and Plan:  Obesity-   Initial weight & BMI: 187 #  Telehealth Body Mass Index: 32.17 at 02/21/2023 12:51 PM  Today's weight & BMI: 175Telehealth Body Mass Index: [id number] at 11/24/2023  9:54 AM  Total weight loss to date:  12 #,  6.4 %TBWL     Treatment:  Plan/ level of monitoring: Nutrition Counseling and Pharmacotherapy    Current Medication - Continue Metformin  1500. We also discussed Zepbound. Right now we will monitor her progress over the next few months     Failed medication - none  Discussion of safety/side effects -reviewed     Further obesity management recommendations:   Annual screen for a assessment of Type II diabetes, HTN, Hyperlipidemia, Depression, OSA, NAFLD, Vitamin D deficiency and renal disease.   Given the association of obesity with cancer, all age appropriate cancer screenings should be UTD  Medication Review: If possible medication that promote weight gain should be avoided and medication that are weight-neutral or promote weight loss should be considered.     Comorbid Conditions:  HLD  H/o breast cancer  OSA    Total Time Today was 20 minutes in the following activities: Preparing to see the patient, Obtaining and/or reviewing separately obtained history, Performing a medically appropriate examination and/or evaluation, Counseling and educating the patient/family/caregiver, Ordering medications, tests, or procedures, and Documenting clinical information in the electronic or other health record      Future Appointments   Date Time Provider Department Center   11/24/2023  2:00 PM Donnamarie Gables [facility]   12/01/2023  1:20 PM Russ Course,  RD [facility] IM   01/20/2024  1:30 PM O'Dea, Quin Brush, MD [id number] [place] Ex[facility]   07/05/2024 11:30 AM Flanagin, Danielle M, APRN-NP [facility]

## 2023-11-25 ENCOUNTER — Encounter: Admit: 2023-11-25 | Discharge: 2023-11-25 | Payer: MEDICARE

## 2023-12-01 ENCOUNTER — Encounter: Admit: 2023-12-01 | Discharge: 2023-12-01 | Payer: MEDICARE

## 2023-12-04 ENCOUNTER — Encounter: Admit: 2023-12-04 | Discharge: 2023-12-04 | Payer: MEDICARE

## 2024-01-27 ENCOUNTER — Encounter: Admit: 2024-01-27 | Discharge: 2024-01-27 | Payer: MEDICARE

## 2024-02-10 ENCOUNTER — Encounter: Admit: 2024-02-10 | Discharge: 2024-02-10 | Payer: MEDICARE

## 2024-02-12 NOTE — Patient Instructions
 Thank you for coming to see [place] today.   Please call our office or send a message through MyChart if you have any questions or concerns.          Dr. Dellia Cloud and Eyvonne Mechanic, APRN  Delorise Royals, APRN  Nurses: Kris Mouton, Karin Lieu and [place] Crimi  Phone: [phone]   Fax: [phone]    [facility] Location  [address] [place], [place] [address]    [facility] Location  [address] [place]vin [place]  [place], [place] [address]

## 2024-02-17 ENCOUNTER — Encounter: Admit: 2024-02-17 | Discharge: 2024-02-17 | Payer: MEDICARE

## 2024-02-17 DIAGNOSIS — Z1231 Encounter for screening mammogram for malignant neoplasm of breast: Secondary | ICD-10-CM

## 2024-02-17 DIAGNOSIS — C50412 Malignant neoplasm of upper-outer quadrant of left female breast: Principal | ICD-10-CM

## 2024-02-17 DIAGNOSIS — M8589 Other specified disorders of bone density and structure, multiple sites: Secondary | ICD-10-CM

## 2024-02-17 DIAGNOSIS — Z79811 Long term (current) use of aromatase inhibitors: Secondary | ICD-10-CM

## 2024-02-17 NOTE — Progress Notes
 Name: Alicia Sharp          MRN: [id number]      DOB: Jan 09, 1956      AGE: 68 y.o.   DATE OF SERVICE: 02/17/2024             Reason for Visit:  Heme/Onc Care       Cancer Staging   Malignant neoplasm of upper-outer quadrant of left breast in female, estrogen receptor positive (CMS-HCC)  Staging form: Breast, AJCC 8th Edition  - Clinical: Stage IA (cT1c, cN0, cM0, G2, ER+, PR+, HER2-) - Signed by Mavis Shuck, APRN on 08/25/2018  - Pathologic: Stage IA (pT1c, pN0(sn), cM0, G2, ER+, PR+, HER2-) - Signed by Mavis Shuck, APRN on 08/25/2018      Alicia Sharp is a 68 y.o. female who presented to the [facility] on 07/16/2018 at age 79 for evaluation of her left breast cancer. An abnormality was noted on screening mammogram. Left breast biopsy 07/02/18 revealed a grade 2, IDC at 12:00. S/p left lumpectomy/SLNB. She reports that she tolerated surgery generally well.  Completed adjuvant radiation.     Current Therapy:  Arimidex  (10/28/2018)    History of Present Illness:       Alicia Sharp is a 68 year old female who presents with joint pain while on anastrozole  therapy.    She experiences knee pain, hip pain, and persistent back pain, which she describes as bothersome. X-rays have been performed, indicating either osteoporosis or osteoarthritis, though she is uncertain of the exact diagnosis.    Her last bone density scan in January showed very mild osteopenia. She is currently taking anastrozole  and is concerned about its impact on her joint pain. Her bone density remains relatively good.    She maintains a busy lifestyle with frequent travel and visitors. She has recently moved to [place] and is adjusting to her new neighborhood.             Review of Systems   Constitutional:  Negative for fatigue.   Respiratory:  Negative for shortness of breath.    Cardiovascular:  Negative for chest pain.   Gastrointestinal:  Negative for blood in stool, constipation and diarrhea.   Musculoskeletal:  Positive for arthralgias.   Skin:  Negative for rash.   Neurological:  Negative for weakness.   Psychiatric/Behavioral:  Negative for confusion.          Past Medical History:    Allergy    Arthritis    Back pain    Breast cancer (CMS-HCC)    Hormone replacement therapy    Hyperlipidemia    Joint pain    Obesity    Obstructive sleep apnea    Osteoarthritis    Other malignant neoplasm without specification of site    Personal history of irradiation    PONV (postoperative nausea and vomiting)    Seasonal allergic reaction    Seasonal allergies       Surgical History:   Procedure Laterality Date    LEFT RADIOACTIVE SEED LOCALIZED LUMPECTOMY Left 08/17/2018    Performed by Arcelia Larraine BRAVO, MD at [facility] OR    IDENTIFICATION SENTINEL LYMPH NODE Left 08/17/2018    Performed by Arcelia Larraine BRAVO, MD at [facility] OR    INJECTION RADIOACTIVE TRACER FOR SENTINEL NODE IDENTIFICATION Left 08/17/2018    Performed by Arcelia Larraine BRAVO, MD at [facility] OR    LEFT SENTINEL LYMPH NODE BIOPSY Left 08/17/2018    Performed by Arcelia Larraine  E, MD at [facility] OR    COLONOSCOPY DIAGNOSTIC WITH SPECIMEN COLLECTION BY BRUSHING/ WASHING - FLEXIBLE N/A 04/07/2019    Performed by Verlon Lynwood SAUNDERS, MD at [facility] OR    BREAST SURGERY  1-20    lumpectomy    HX ROTATOR CUFF REPAIR      R 08/2010. L 09/2015.    HX WISDOM TEETH EXTRACTION      KNEE ARTHROSCOPY Left     2014    KNEE SURGERY  06/2013    L knee    SHOULDER SURGERY  R rotator 08/2010, L rotator2/2018        Family History   Problem Relation Name Age of Onset    Migraines Mother Victoire Deans     Bladder Cancer Mother Sol Odor     High Cholesterol Mother Moon Budde     Hypertension Mother Caroleena Paolini     Cancer Mother Corneisha Alvi         bladder    Arthritis Mother Bitha Fauteux     Heart Disease Father Lennox Leikam     Stroke Father Ezzard Feast     Coronary Artery Disease Father Ezzard Feast     High Cholesterol Father Ezzard Feast     Hypertension Father Ezzard Feast     Cancer-Breast Maternal Aunt Judy         30s    Cancer-Prostate Maternal Grandfather [place]     Migraines Maternal Grandfather [place]     Stroke Maternal Grandmother [place]     Anemia Brother Ozell Feast     Arthritis-osteo Mother Bahja Bence     Back pain Mother Lener Ventresca         bulging disc    Heart problem Father Ezzard Feast         bypass surgery        Social History     Socioeconomic History    Marital status: Widowed    Number of children: 2   Occupational History    Occupation: retired   Tobacco Use    Smoking status: Never    Smokeless tobacco: Never    Tobacco comments:     briefly in college   Substance and Sexual Activity    Alcohol use: Yes     Alcohol/week: 4.0 standard drinks of alcohol     Types: 4 Glasses of wine per week     Comment: 1 glass of wine/evening    Drug use: Never    Sexual activity: Not Currently     Partners: Male     Birth control/protection: Post-menopausal   Social History Narrative    Here with daughter Rocky          Objective:     No Known Allergies         anastrozole  (ARIMIDEX ) 1 mg tablet TAKE ONE TABLET BY MOUTH EVERY DAY    atorvastatin  (LIPITOR) 40 mg tablet TAKE 1/2 TABLET BY MOUTH EVERY DAY    calcium carbonate (TUMS PO) Take  by mouth. Twice a day    CHOLEcalciferoL (vitamin D3) (VITAMIN D3) 5000 unit tablet Take one tablet by mouth.    cyanocobalamin  (vitamin B-12) (VITAMIN B-12) 1,000 mcg tablet Take one tablet by mouth daily. Indications: prevention of vitamin B12 deficiency    fexofenadine (ALLEGRA) 180 mg tablet Take one tablet by mouth daily. (Patient not taking: Reported on 02/17/2024)    fluticasone  propionate (FLONASE  ALLERGY RELIEF) 50 mcg/actuation nasal spray, suspension Apply  two sprays to each nostril as directed twice daily. (Patient not taking: Reported on 02/17/2024)    meloxicam  (MOBIC ) 7.5 mg tablet TAKE ONE TABLET BY MOUTH DAILY    metFORMIN -XR (GLUCOPHAGE  XR) 500 mg extended release tablet Take three tablets by mouth daily with dinner. ONE DAILY MULTIVITAMIN PO        Vitals:    02/17/24 1305   BP: 123/68   BP Source: Arm, Right Upper   Pulse: 83   Temp: 36.4 ?C (97.5 ?F)   SpO2: 97%   TempSrc: Temporal   PainSc: Zero   Weight: 82.8 kg (182 lb 9.6 oz)       Body mass index is 32.35 kg/m?[name]     Pain Score: Zero       Fatigue Scale: 0-None    Pain Addressed:  N/A    Patient Evaluated for a Clinical Trial: No treatment clinical trial available for this patient.     [place] Cooperative Oncology Group performance status is 0, Fully active, able to carry on all pre-disease performance without restriction.[name]     Physical Exam  Vitals reviewed.   Constitutional:       Appearance: Normal appearance. She is well-developed.   HENT:      Head: Normocephalic and atraumatic.      Mouth/Throat:      Mouth: Mucous membranes are moist. No oral lesions.   Eyes:      General: No scleral icterus.  Cardiovascular:      Rate and Rhythm: Normal rate.   Pulmonary:      Effort: Pulmonary effort is normal. No respiratory distress.   Chest:   Breasts:     Right: No inverted nipple, mass, nipple discharge or skin change.      Left: No inverted nipple, mass, nipple discharge or skin change.       Musculoskeletal:      Right lower leg: No edema.      Left lower leg: No edema.   Lymphadenopathy:      Upper Body:      Right upper body: No supraclavicular or axillary adenopathy.      Left upper body: No supraclavicular or axillary adenopathy.   Skin:     General: Skin is warm and dry.      Coloration: Skin is not jaundiced.   Neurological:      Mental Status: She is alert and oriented to person, place, and time.   Psychiatric:         Mood and Affect: Mood normal.         Behavior: Behavior normal.              No results found for this or any previous visit (from the past 4 weeks).        Assessment and Plan:  The patient is a 68 y.o. female with the following:    1.  Left grade 2, IDC (ER91-100%, PR91-100%, Her2 0+), at 12:00, dx 06/2018.  Clinical Stage IA; cT2 N0 M0.  She is now s/p lumpectomy and SLNB; pT1c N0 M0. She completed adjuvant radiation therapy.  2. Osteopenia.         Arthritis  Knee, hip, and lower back pain likely due to arthritis. Anastrozole  may exacerbate joint discomfort by reducing estrogen.  - Consider 2-3 week break from anastrozole  to assess impact on joint pain.  - If significant improvement, consider switching to Aromasin .  - Check in late July to discuss pain status and medication  changes.    Osteopenia  Very mild osteopenia noted on bone density scan in January. Current bone health is good.  - Schedule bone density scan in January.            Total Time Today was 30 minutes in the following activities: Preparing to see the patient, Obtaining and/or reviewing separately obtained history, Performing a medically appropriate examination and/or evaluation, Counseling and educating the patient/family/caregiver, Ordering medications, tests, or procedures, Documenting clinical information in the electronic or other health record and Independently interpreting results (not separately reported) and communicating results to the patient/family/caregiver

## 2024-02-20 ENCOUNTER — Encounter: Admit: 2024-02-20 | Discharge: 2024-02-20 | Payer: MEDICARE

## 2024-02-20 MED ORDER — ATORVASTATIN 40 MG PO TAB
20 mg | ORAL_TABLET | Freq: Every day | ORAL | 1 refills | 90.00000 days | Status: AC
Start: 2024-02-20 — End: ?

## 2024-02-25 ENCOUNTER — Ambulatory Visit: Admit: 2024-02-25 | Discharge: 2024-02-25 | Payer: MEDICARE

## 2024-02-25 ENCOUNTER — Encounter: Admit: 2024-02-25 | Discharge: 2024-02-25 | Payer: MEDICARE

## 2024-02-25 DIAGNOSIS — G4733 Obstructive sleep apnea (adult) (pediatric): Principal | ICD-10-CM

## 2024-02-25 DIAGNOSIS — E669 Obesity, unspecified: Secondary | ICD-10-CM

## 2024-02-25 NOTE — Patient Instructions
 Our clinic phone number is [phone]. Please call and schedule a follow up appointment me in 3 months.

## 2024-02-25 NOTE — Progress Notes
 [place]  Weight Management  Follow Up Visit    Date of Service: 02/25/2024  PCP: Lemon Houston  DOB: 1956/04/19  MRN#: [id number]    Alicia Sharp is a 68 y.o. female.        History of Present Illness  Alicia Sharp is a 68 y.o. female with  has a past medical history of Allergy, Arthritis, Back pain (6-24), Breast cancer (CMS-HCC) (07/2018), Hormone replacement therapy, Hyperlipidemia, Joint pain (Nov. 2014), Obesity, Obstructive sleep apnea (last month), Osteoarthritis (06/2013), Other malignant neoplasm without specification of site (Nov. 2019), Personal history of irradiation, PONV (postoperative nausea and vomiting) (06/2013), Seasonal allergic reaction, and Seasonal allergies. presenting for follow up discussion of obesity and weight management.       Adriane  is taking 1000 mg, but has had difficulty being adherent as life has been busy and she has a hard time taking it to coincide with dinner--which is how the prescription was written. She experiences minor, manageable side effects from metformin .    Since moving in May, her lifestyle has been hectic with frequent guests and travel, impacting her nutrition and medication routine.  She says many meals have been eaten out, which she acknowledges heads to high fat and high sodium intake.    She is very physically active, participating in dragon boat racing, which has increased her exercise levels over the summer. She practices on Tuesday and Thursday evenings, which sometimes interferes with her meal schedule. She frequently uses bagged salads for meals, often adding fresh vegetables to enhance them.      She continues to follow with Mallie in nutrition counseling.         Review of Systems   Constitution: Positive for weight maintenance  GI: negative for GI upset    Objective:     Physical Exam  Constitutional: Well developed, well nourished, no distress   HENT: Normocephalic, atraumatic  Respiratory: Normal effort  Neurologic: Alert  Psych: mood /affect normal. Behavior normal. Thought content normal. Judgement normal.    Medications-   anastrozole  (ARIMIDEX ) 1 mg tablet TAKE ONE TABLET BY MOUTH EVERY DAY    atorvastatin  (LIPITOR) 40 mg tablet TAKE 1/2 TABLET BY MOUTH EVERY DAY    calcium carbonate (TUMS PO) Take  by mouth. Twice a day    CHOLEcalciferoL (vitamin D3) (VITAMIN D3) 5000 unit tablet Take one tablet by mouth.    cyanocobalamin  (vitamin B-12) (VITAMIN B-12) 1,000 mcg tablet Take one tablet by mouth daily. Indications: prevention of vitamin B12 deficiency    fexofenadine (ALLEGRA) 180 mg tablet Take one tablet by mouth daily. (Patient not taking: Reported on 02/25/2024)    fluticasone  propionate (FLONASE  ALLERGY RELIEF) 50 mcg/actuation nasal spray, suspension Apply two sprays to each nostril as directed twice daily. (Patient not taking: Reported on 02/25/2024)    meloxicam  (MOBIC ) 7.5 mg tablet TAKE ONE TABLET BY MOUTH DAILY    metFORMIN -XR (GLUCOPHAGE  XR) 500 mg extended release tablet Take three tablets by mouth daily with dinner.    ONE DAILY MULTIVITAMIN PO      Recent Labs:  Basic Metabolic Profile    Lab Results   Component Value Date/Time    NA 138 07/08/2023 01:44 PM    K 3.9 07/08/2023 01:44 PM    CA 8.9 07/08/2023 01:44 PM    CL 104 07/08/2023 01:44 PM    CO2 27 07/08/2023 01:44 PM    GAP 8 07/08/2023 01:44 PM    Lab Results   Component Value  Date/Time    BUN 21 07/08/2023 01:44 PM    CR 0.75 07/08/2023 01:44 PM    GLU 83 07/08/2023 01:44 PM        Hepatic Function    Lab Results   Component Value Date/Time    ALBUMIN 4.1 07/08/2023 01:44 PM    TOTPROT 6.9 07/08/2023 01:44 PM    ALKPHOS 80 07/08/2023 01:44 PM    Lab Results   Component Value Date/Time    AST 19 07/08/2023 01:44 PM    ALT 16 07/08/2023 01:44 PM    TOTBILI 0.3 07/08/2023 01:44 PM        TSH   Lab Results   Component Value Date/Time    TSH 1.14 12/25/2020 09:14 AM      Lab Draw:  Lab Results   Component Value Date/Time    HGBA1C 5.4 07/08/2023 01:44 PM     POC:  Lab Results Component Value Date/Time    A1C 5.3 10/23/2020 12:00 AM    A1C 5.3 01/18/2019 12:00 AM      Lab Results   Component Value Date    CHOL 138 07/08/2023    TRIG 193 (H) 07/08/2023    HDL 53 07/08/2023    LDL 75 07/08/2023    VLDL 61.3 07/08/2023    NONHDLCHOL 85 07/08/2023      Wt Readings from Last 5 Encounters:   02/17/24 82.8 kg (182 lb 9.6 oz)   12/01/23 79.7 kg (175 lb 12.8 oz)   11/24/23 82.6 kg (182 lb)   10/30/23 80.3 kg (177 lb)   09/18/23 80.3 kg (177 lb)     BP Readings from Last 3 Encounters:   02/17/24 123/68   11/24/23 129/58   09/12/23 122/57      Telehealth Patient Reported Vitals       Row Name 02/25/24 1036                Pulse: 77        Weight: 80.3 kg (177 lb)        Height: 160 cm (5' 3)             Telehealth Body Mass Index: [phone] at 02/25/2024 10:41 AM         Assessment and Plan:  Obesity-   Initial weight & BMI: 187 #  Telehealth Body Mass Index: 32.17 at 02/21/2023 12:51 PM  Today's weight & BMI: 177Telehealth Body Mass Index: [id number] at 02/25/2024 10:41 AM  Total weight loss to date:  10 #,  6.4 %TBWL     Treatment:  Plan/ level of monitoring: Nutrition Counseling and Pharmacotherapy    Current Medication -     Continue metformin  1000 mg daily with her evening meds to help with adherence. Discussed that Metformin  doesn't necessary have to be taken with dinner but some patients tolerate it better with food.  Monitor for side effects and adjust timing with food if necessary. Continue to follow with Mallie for nutritional guidance.           Failed medication - none  Discussion of safety/side effects -reviewed     Further obesity management recommendations:   Annual screen for a assessment of Type II diabetes, HTN, Hyperlipidemia, Depression, OSA, NAFLD, Vitamin D deficiency and renal disease.   Given the association of obesity with cancer, all age appropriate cancer screenings should be UTD  Medication Review: If possible medication that promote weight gain should be avoided and medication that are weight-neutral or promote weight  loss should be considered.     Comorbid Conditions:  HLD  H/o breast cancer  OSA    Total Time Today was 20 minutes in the following activities: Preparing to see the patient, Obtaining and/or reviewing separately obtained history, Performing a medically appropriate examination and/or evaluation, Counseling and educating the patient/family/caregiver, Ordering medications, tests, or procedures, and Documenting clinical information in the electronic or other health record      Future Appointments   Date Time Provider Department Center   02/25/2024 10:50 AM Andra Orvil RAMAN, MD [facility] IM   03/03/2024 11:40 AM Augustin Mallie HERO, RD [facility] IM   07/05/2024 11:30 AM Flanagin, Danielle M, APRN-NP [facility] IM   09/20/2024  8:15 AM [facility] - [facility] ROOM 1 [id number] Radiolog   09/20/2024  9:00 AM O'Dea, Arlean SQUIBB, MD [facility] [place] Exam

## 2024-03-03 ENCOUNTER — Encounter: Admit: 2024-03-03 | Discharge: 2024-03-03 | Payer: MEDICARE

## 2024-03-19 ENCOUNTER — Encounter: Admit: 2024-03-19 | Discharge: 2024-03-19 | Payer: MEDICARE

## 2024-03-19 ENCOUNTER — Ambulatory Visit: Admit: 2024-03-19 | Discharge: 2024-03-19 | Payer: MEDICARE

## 2024-03-19 DIAGNOSIS — M79644 Pain in right finger(s): Principal | ICD-10-CM

## 2024-03-19 DIAGNOSIS — J019 Acute sinusitis, unspecified: Secondary | ICD-10-CM

## 2024-03-19 DIAGNOSIS — M65311 Trigger thumb, right thumb: Secondary | ICD-10-CM

## 2024-03-19 MED ORDER — BENZONATATE 100 MG PO CAP
100 mg | ORAL_CAPSULE | ORAL | 0 refills | 9.00000 days | Status: DC | PRN
Start: 2024-03-19 — End: 2024-03-19

## 2024-03-19 MED ORDER — BENZONATATE 100 MG PO CAP
100 mg | ORAL_CAPSULE | ORAL | 0 refills | 9.00000 days | Status: AC | PRN
Start: 2024-03-19 — End: ?

## 2024-03-19 MED ORDER — AMOXICILLIN-POT CLAVULANATE 875-125 MG PO TAB
1 | ORAL_TABLET | Freq: Two times a day (BID) | ORAL | 0 refills | 7.00000 days | Status: DC
Start: 2024-03-19 — End: 2024-03-19

## 2024-03-19 MED ORDER — AMOXICILLIN-POT CLAVULANATE 875-125 MG PO TAB
1 | ORAL_TABLET | Freq: Two times a day (BID) | ORAL | 0 refills | 7.00000 days | Status: AC
Start: 2024-03-19 — End: ?

## 2024-03-19 NOTE — Patient Instructions
 Thank you so much for visiting with our care team today. We look forward to seeing you again.     We offer walk-in care for acute orthopedic injuries such as fractures, dislocations, strains and sprains at our [facility] in [place].   Walk-in hours are 8 a.m.-7 p.m. Monday-Friday and 8 a.m.-2 p.m. Saturdays.  NOTE: The same-day clinic will be closed December 24-26 and December 31-Jan 2 in observance of holidays.  [employer] at [address], [place], [place] [address].  Treatment is available for any acute orthopedic injury, including sports-related injuries, for those 69 years old and above. Injuries treated include:  Bone fractures  Dislocations  Joint injuries  Sprains  Strains      If you have radiology/imaging orders that need to be completed please call radiology scheduling at [phone] to schedule. (CT imaging, MRI's, Ultrasounds, Mammograms, etc.)     If you have cardiac procedures that need to be scheduled please call [phone] to schedule. (Echocardiograms, Stress test, Heart Monitors, etc.)    For referrals placed during the visit, if you have not heard from scheduling within on week: please call the call center at [phone].     For rehab services (Physical therapy, Occupation therapy, Speech therapy, etc.) at the [facility] please call [phone]. For [facility] campus please call [phone].    For the Sleep Disorders Clinic at [facility] please call [phone].     Your results from labs will be released to you directly in My Chart prior to us  seeing them. If you have not heard from us  within 2 weeks of your testing please reach out to our office or send us  a MyChart message.     If you are needing medication refills, please use the MyChart Refill request or contact your pharmacy directly to request medication refills.  Please allow 72 hours.    MyChart: You may sign up for MyChart to receive test results, contact our team with questions, or have prescription requests filled.    Follow the instructions at the end of visit summary    You can call us  or send a MyChart message.     For appointments:   - Please try to arrive early for your appointment to help facilitate your visit: 15 minutes early is recommended, 30 minutes may be needed if you need assistance with the require questionnaires on the tablet.   - You can complete required questions through MyChart within 7 days of your upcoming appointment. Doing these at your convenience, prior to your appointment, will help save time in office.   - If you are late to your appointment, we reserve the right to ask you to reschedule or wait until next available time to be seen in fairness to other patients in clinic that day.     If you are a patient of Rupert Hammersmith, please call [phone]    If you are a patient of Severa Argyle, please call [phone]

## 2024-03-19 NOTE — Progress Notes
 Date of Service: 03/19/2024  MRN#: [id number]  DOB: 12-07-1955  PCP: Lemon Houston    Subjective:   Chief Complaint   Patient presents with    Follow Up     Pain in right thumb, strained 2-3 weeks ago during exercise       Alicia Sharp is a 68 y.o.female patient who presents for        History of Present Illness      Right thumb pain   Strained thumb about 2-3 weeks ago when she was paddling   No bruising or swelling right after accident   Has been doing gentle massages   Has not bee improving   She will get a catch in her thumb and then gets stuck   Hurts to straighten back out   Opening a jar can hurt    Lifting does not hurt   Has been taking meloxicam  as needed     2. Congestion  Started about 2-3 weeks ago   Cough is lingering   Mildly productive: yellow/clear   Some sinus congestion   Denies fever or chills   Denies chest pain  Denies SOA, DOE, or wheezing  Coughing is worse with activity   Laughing can trigger cough   OTC: benadryl , sudafed         Past Medical History:    Allergy    Arthritis    Back pain    Breast cancer (CMS-HCC)    Hormone replacement therapy    Hyperlipidemia    Joint pain    Obesity    Obstructive sleep apnea    Osteoarthritis    Other malignant neoplasm without specification of site    Personal history of irradiation    PONV (postoperative nausea and vomiting)    Seasonal allergic reaction    Seasonal allergies       Review of Systems  Per HPI     Objective:     Medication:   amoxicillin -potassium clavulanate (AUGMENTIN ) 875/125 mg tablet Take one tablet by mouth twice daily with meals for 7 days.    anastrozole  (ARIMIDEX ) 1 mg tablet TAKE ONE TABLET BY MOUTH EVERY DAY (Patient not taking: Reported on 03/19/2024)    atorvastatin  (LIPITOR) 40 mg tablet TAKE 1/2 TABLET BY MOUTH EVERY DAY    benzonatate  (TESSALON  PERLES) 100 mg capsule Take one capsule by mouth every 8 hours as needed for Cough.    calcium carbonate (TUMS PO) Take  by mouth. Twice a day    CHOLEcalciferoL (vitamin D3) (VITAMIN D3) 5000 unit tablet Take one tablet by mouth.    cyanocobalamin  (vitamin B-12) (VITAMIN B-12) 1,000 mcg tablet Take one tablet by mouth daily. Indications: prevention of vitamin B12 deficiency    ferrous sulfate  (FEOSOL) 325 mg (65 mg iron ) tablet Take one tablet by mouth daily. Take on an empty stomach at least 1 hour before or 2 hours after food.    fexofenadine (ALLEGRA) 180 mg tablet Take one tablet by mouth daily. (Patient not taking: Reported on 03/19/2024)    fluticasone  propionate (FLONASE  ALLERGY RELIEF) 50 mcg/actuation nasal spray, suspension Apply two sprays to each nostril as directed twice daily. (Patient not taking: Reported on 03/19/2024)    meloxicam  (MOBIC ) 7.5 mg tablet TAKE ONE TABLET BY MOUTH DAILY    metFORMIN -XR (GLUCOPHAGE  XR) 500 mg extended release tablet Take three tablets by mouth daily with dinner. (Patient taking differently: Take two tablets by mouth daily with dinner.)    ONE DAILY MULTIVITAMIN PO  Vitals:    03/19/24 1004   BP: 134/54   BP Source: Arm, Right Upper   Pulse: 58   Temp: 37.2 ?C (99 ?F)   SpO2: 100%   TempSrc: Oral   PainSc: Zero   Weight: 80.6 kg (177 lb 12.8 oz)   Height: 160 cm (5' 3)       Body mass index is 31.5 kg/m?[name]      Physical Exam    GEN: awake, alert, NAD  HEENT: NCAT, EOMI, PERRL, nares patent, OP clear  LYMPH: supple, no LAD  CV: RRR no m/r/g  LUNGS: CTA B, no r/r/w  ABD: BS+, soft, NT, ND  RIGHT HAND: notable trigger thumb on right hand, no pain to palpation, no redness, no swelling, full ROM but notable catch   NEURO: CN 2-12 intact, no focal deficits  SKIN: warm, dry, no lesions noted       Assessment and Plan:  Assessment & Plan  Pain of right thumb  Trigger finger of right thumb  Strained about 2-3 weeks ago   Concern for right trigger thumb   Will obtain X-rays due to trauma to r/o fracture   Proceed with hand ortho referral to address trigger finger   Orders:    HAND MIN 3 VIEWS RIGHT; Future    AMB REFERRAL TO ORTHOPEDIC SURGERY/ SPORTS MEDICINE    Acute bacterial sinusitis  Symptoms ongoing for 10+ days   Consistent with sinusitis  Start Augmentin  Bid for 7 days. Take with food. Take full course   Add in tessalon  pearls TID prn for cough   Add in Mucinex BID   Add in Zyrtec and Flonase  daily   RTC if no improvement in 2-3 days            Orders Placed This Encounter    HAND MIN 3 VIEWS RIGHT    ORTHOPEDIC SURGERY/ SPORTS MEDICINE    benzonatate  (TESSALON  PERLES) 100 mg capsule    amoxicillin -potassium clavulanate (AUGMENTIN ) 875/125 mg tablet       Total of 30 minutes were spent on the same day of the visit including preparing to see the patient, obtaining and/or reviewing separately obtained history, performing a medically appropriate examination and/or evaluation, counseling and educating the patient/family/caregiver, ordering medications, tests, or procedures, referring and communication with other health care professionals, documenting clinical information in the electronic or other health record, independently interpreting results and communicating results to the patient/family/caregiver, and care coordination.   Visit Disposition       Dispositions    Return for Keep all scheduled follow up appointments.            Future Appointments   Date Time Provider Department Center   04/16/2024 11:40 AM Augustin Mallie HERO, RD IMWEIGHT IM   07/05/2024 11:30 AM Flanagin, Danielle M, APRN-NP [facility] IM   09/13/2024  2:00 PM [facility] Radiolo   09/20/2024  8:15 AM [facility] - [facility] ROOM 1 IC[facility] ICC Radiolog   09/20/2024  9:00 AM O'Dea, Arlean SQUIBB, MD [facility] [place] Exam     Patient Instructions   Thank you so much for visiting with our care team today. We look forward to seeing you again.     We offer walk-in care for acute orthopedic injuries such as fractures, dislocations, strains and sprains at our [facility] in [place].   Walk-in hours are 8 a.m.-7 p.m. Monday-Friday and 8 a.m.-2 p.m. Saturdays.  NOTE: The same-day clinic will be closed December 24-26 and  December 31-Jan 2 in observance of holidays.  [employer] at [address], [place], [place] [address].  Treatment is available for any acute orthopedic injury, including sports-related injuries, for those 47 years old and above. Injuries treated include:  Bone fractures  Dislocations  Joint injuries  Sprains  Strains      If you have radiology/imaging orders that need to be completed please call radiology scheduling at [phone] to schedule. (CT imaging, MRI's, Ultrasounds, Mammograms, etc.)     If you have cardiac procedures that need to be scheduled please call [phone] to schedule. (Echocardiograms, Stress test, Heart Monitors, etc.)    For referrals placed during the visit, if you have not heard from scheduling within on week: please call the call center at [phone].     For rehab services (Physical therapy, Occupation therapy, Speech therapy, etc.) at the [facility] please call [phone]. For [facility] campus please call [phone].    For the Sleep Disorders Clinic at [facility] please call [phone].     Your results from labs will be released to you directly in My Chart prior to us  seeing them. If you have not heard from us  within 2 weeks of your testing please reach out to our office or send us  a MyChart message.     If you are needing medication refills, please use the MyChart Refill request or contact your pharmacy directly to request medication refills.  Please allow 72 hours.    MyChart: You may sign up for MyChart to receive test results, contact our team with questions, or have prescription requests filled.    Follow the instructions at the end of visit summary    You can call us  or send a MyChart message.     For appointments:   - Please try to arrive early for your appointment to help facilitate your visit: 15 minutes early is recommended, 30 minutes may be needed if you need assistance with the require questionnaires on the tablet.   - You can complete required questions through MyChart within 7 days of your upcoming appointment. Doing these at your convenience, prior to your appointment, will help save time in office.   - If you are late to your appointment, we reserve the right to ask you to reschedule or wait until next available time to be seen in fairness to other patients in clinic that day.     If you are a patient of Rupert Hammersmith, please call [phone]    If you are a patient of Severa Argyle, please call [phone]    Hadassah FORBES Dapper, APRN-NP

## 2024-03-20 ENCOUNTER — Encounter: Admit: 2024-03-20 | Discharge: 2024-03-20 | Payer: MEDICARE

## 2024-03-23 ENCOUNTER — Encounter: Admit: 2024-03-23 | Discharge: 2024-03-23 | Payer: MEDICARE

## 2024-03-23 ENCOUNTER — Ambulatory Visit: Admit: 2024-03-23 | Discharge: 2024-03-24 | Payer: MEDICARE

## 2024-03-23 DIAGNOSIS — M65311 Trigger thumb, right thumb: Principal | ICD-10-CM

## 2024-03-23 MED ORDER — TRIAMCINOLONE ACETONIDE 40 MG/ML IJ SUSP
40 mg | Freq: Once | INTRAMUSCULAR | 0 refills | Status: CP | PRN
Start: 2024-03-23 — End: ?

## 2024-03-23 MED ORDER — ROPIVACAINE (PF) 5 MG/ML (0.5 %) IJ SOLN
1 mL | Freq: Once | INTRAMUSCULAR | 0 refills | Status: CP | PRN
Start: 2024-03-23 — End: ?

## 2024-03-23 NOTE — Procedures
 Attending Surgeon: Manuelita DELENA Hasten, PA-C    Anesthesia: Local      Tendon Injection  Location: Thumb - R thumb flexor sheath    Consent:   Consent obtained: verbal  Consent given by: patient  Risks discussed: Pain, Infection, Damage to surrounding structures and Bleeding  Alternatives discussed: alternative treatment, delayed treatment, no treatment and referral  Discussed with patient the purpose of the treatment/procedure, other ways of treating my condition, including no treatment/ procedure and the risks and benefits of the alternatives. Patient has decided to proceed with treatment/procedure.        Universal Protocol:  Relevant documents: relevant documents present and verified  Test results: test results available and properly labeled  Imaging studies: imaging studies available  Required items: required blood products, implants, devices, and special equipment available  Site marked: the operative site was marked  Patient identity confirmed: Patient identify confirmed verbally with patient.        Time out: Immediately prior to procedure a time out was called to verify the correct patient, procedure, equipment, support staff and site/side marked as required      Procedures Details:        JOINT LOCATION: Thumb  SITE: R thumb flexor       Indications: pain   Medications administered: 1 mL ROPivacaine  (PF) 0.5% (5 mg/mL); 40 mg triamcinolone  acetonide 40 mg/mL  Patient tolerance: Patient tolerated the procedure well with no immediate complications. Pressure was applied, and hemostasis was accomplished.                        Estimated blood loss: none or minimal  Specimens: none  Patient tolerated the procedure well with no immediate complications. Pressure was applied, and hemostasis was accomplished.

## 2024-03-23 NOTE — Progress Notes
 Orthopaedic New Patient Note    Referring Provider: Darci Hadassah BRAVO, APRN-NP     Assessment/Plan     Right trigger thumb    Plan:  We discussed the diagnosis, pathophysiology, and potential treatment options for a trigger digit, or stenosing tenosynovitis.  We discussed the treatment options of rest, anti-inflammatory medications, corticosteroid injections, and surgical release of the A1 pulley. After discussing the treatment options patient would like to proceed with corticosteroid injection. We discussed risk of incomplete relief and potential need for further injection or surgery.     We have reviewed the risks and benefits of corticosteroid injections, including bleeding, infection, allergic reactions, continued or worsening pain, injury to surrounding structures, skin discoloration, and fatty atrophy.  The patient understands these well and wishes to proceed.      Following a betadine and alcohol prep of the volar wrist, 1cc of kenalog  and 1cc of ropivacaine  were injected as volume allowed into the right thumb A1 pulley area.  A band-aid was placed over the injection site. They tolerated the procedure well.  The patient will return to clinic in 3 months or as needed for a recheck.  They are asked to give us  a call at any time with questions or concerns.  They left today after all of their questions were answered.                       Manuelita Hasten, PA-C    History and Physical        History of Present Illness:   Alicia Sharp is a 68 year old female who presents clinic with a complaint of right thumb pain.  She states that about 2 months ago she began to have discomfort and occasional catching in the right thumb.  This catching and pain have increased.  She has tried Voltaren cream with minimal improvement.  She denies any trauma to the area.  She presents today for initial evaluation.  She is not a diabetic.  She has had no other treatment for this thumb.    Focused Physical Examination:  Ht 160 cm (5' 3)  - Wt 80.3 kg (177 lb)  - BMI 31.35 kg/m?     The patient is alert and orientated to person, place, and time, is in no acute distress, and is cooperative with the examination. Normocephalic, PERRLA, non-labored breathing.    The right hand is without obvious deformity, skin lesion, or edema. No visible mass. Palpable and tender nodule at level of A1 pulley of the right thumb. Painful catching and locking on active flexion.   PIP flexion contracture is minimal  Remainder of digital motion is grossly intact, without evidence of mal-rotation or angular deformity of the digits.  Flexor and extensor tendon functions are grossly intact, including FDS and FDP, extrinsic and intrinsic extensor function.  There is no evidence of sagittal band, central slip, or terminal extensor deficit. No nailbed deformity.    Neurologic function is grossly intact to the bilateral upper limb, including Radial, Ulnar, Median, AIN, and PIN sensory-motor functions. No evidence of CRPS: no dystrophic changes, abnormal hair growth, skin sweating.  No evidence of dysvascular changes.    Imaging: Prior images of the right hand reviewed with the patient in clinic demonstrate mild degenerative changes at the thumb [facility] joint.  No fracture.      Review of Systems:  No fevers, chills, weight loss or other constitutional disorders. No chest pain, shortness of breath, hemoptysis, bleeding diatheses, or other pulmonary  or hematological problems. No adenopathy, unexpected swelling, cardiac, neurologic, or GI problems.      General Physical Exam:    General/Constitutional: No apparent distress, pleasant  Eyes: Sclera anicteric eyes track   Respiratory:No audible wheezing, Non labored breathing  Cardiac: No digital clubbing, cyanosis, or edema  Vascular: No swelling or tenderness unremarkable unless noted in detailed exam   Integumentary: No impressive skin lesions present, unremarkable unless noted in detailed exam.  Neuro/Psych: Normal mood and affect participatory in exam   Musculoskeletal: Normal, except as noted in detailed exam and in HPI.    Past Medical History:  Past Medical History:    Allergy    Arthritis    Back pain    Breast cancer (CMS-HCC)    Hormone replacement therapy    Hyperlipidemia    Joint pain    Obesity    Obstructive sleep apnea    Osteoarthritis    Other malignant neoplasm without specification of site    Personal history of irradiation    PONV (postoperative nausea and vomiting)    Seasonal allergic reaction    Seasonal allergies       Past Surgical History:  Surgical History:   Procedure Laterality Date    LEFT RADIOACTIVE SEED LOCALIZED LUMPECTOMY Left 08/17/2018    Performed by Arcelia Larraine BRAVO, MD at [facility]    IDENTIFICATION SENTINEL LYMPH NODE Left 08/17/2018    Performed by Arcelia Larraine BRAVO, MD at [facility]    INJECTION RADIOACTIVE TRACER FOR SENTINEL NODE IDENTIFICATION Left 08/17/2018    Performed by Arcelia Larraine BRAVO, MD at [facility]    LEFT SENTINEL LYMPH NODE BIOPSY Left 08/17/2018    Performed by Arcelia Larraine BRAVO, MD at [facility]    COLONOSCOPY DIAGNOSTIC WITH SPECIMEN COLLECTION BY BRUSHING/ WASHING - FLEXIBLE N/A 04/07/2019    Performed by Verlon Lynwood SAUNDERS, MD at [facility]    BREAST SURGERY  1-20    lumpectomy    HX ROTATOR CUFF REPAIR      R 08/2010. L 09/2015.    HX WISDOM TEETH EXTRACTION      KNEE ARTHROSCOPY Left     2014    KNEE SURGERY  06/2013    L knee    SHOULDER SURGERY  R rotator 08/2010, L rotator2/2018       Family History:  Family History   Problem Relation Name Age of Onset    Migraines Mother Astoria Condon     Bladder Cancer Mother Daeja Helderman     High Cholesterol Mother Camerin Jimenez     Hypertension Mother Keltie Labell     Cancer Mother Jaleiah Asay         bladder    Arthritis Mother Najah Liverman     Heart Disease Father Melaina Howerton     Stroke Father Ezzard Feast     Coronary Artery Disease Father Ezzard Feast     High Cholesterol Father Ezzard Feast     Hypertension Father Ezzard Feast Cancer-Breast Maternal Aunt Judy         30s    Cancer-Prostate Maternal Grandfather [place]     Migraines Maternal [place]     Stroke Maternal Grandmother [place]     Anemia Brother Anelis Hrivnak     Arthritis-osteo Mother Yatzil Clippinger     Back pain Mother Addysen Louth         bulging disc    Heart problem Father Lewis Popp  bypass surgery     Social History:   Social History[1]    Current Medications:   has a current medication list which includes the following prescription(s): amoxicillin -potassium clavulanate, anastrozole , atorvastatin , benzonatate , calcium carbonate, cholecalciferol (vitamin d3), cyanocobalamin  (vitamin b-12), ferrous sulfate , fexofenadine, fluticasone  propionate, meloxicam , metformin -xr, and multivitamin.    Allergies:   has no known allergies.      Portions of this note may have been created using Dragon, a voice recognition software program.  Please contact my office for clarification on any documentation.         [1]   Social History  Socioeconomic History    Marital status: Widowed    Number of children: 2   Occupational History    Occupation: retired   Tobacco Use    Smoking status: Never    Smokeless tobacco: Never    Tobacco comments:     briefly in college   Substance and Sexual Activity    Alcohol use: Yes     Alcohol/week: 4.0 standard drinks of alcohol     Types: 4 Glasses of wine per week     Comment: 1 glass of wine/evening    Drug use: Never    Sexual activity: Not Currently     Partners: Male     Birth control/protection: Post-menopausal   Social History Narrative    Here with daughter Rocky

## 2024-04-16 ENCOUNTER — Encounter: Admit: 2024-04-16 | Discharge: 2024-04-16 | Payer: MEDICARE

## 2024-04-19 ENCOUNTER — Encounter: Admit: 2024-04-19 | Discharge: 2024-04-19 | Payer: MEDICARE

## 2024-04-19 DIAGNOSIS — E66811 Obesity, Class I, BMI 30-34.9: Principal | ICD-10-CM

## 2024-04-19 NOTE — Progress Notes
 Primary goal of visit: Follow-up nutrition counseling visit for Intensive Behavioral Therapy for Weight Management    Nutrition Assessment of Patient:    Ht Readings from Last 1 Encounters:   03/23/24 160 cm (5' 3)       Wt Readings from Last 5 Encounters:   04/19/24 79.8 kg (176 lb)   03/23/24 80.3 kg (177 lb)   03/19/24 80.6 kg (177 lb 12.8 oz)   02/17/24 82.8 kg (182 lb 9.6 oz)   12/01/23 79.7 kg (175 lb 12.8 oz)    *pt reported weight    Weight change: Lost 9 lbs since initial weight of 185 lbs on 03/24/23     Body mass index is 31.18 kg/m?Alicia Sharp  BMI Categories Adult: class I obese   Total Energy Expenditure (based on Mifflin equation x activity factor): 1370 x 1.375 = 1870 kcal  Total Calorie Prescription for weight loss: 1200-1370 kcal  Needs to promote: weight loss and maintenance  1 year: 03/23/24    Notes:     Things have calmed down. Did try Factor but did not like. Used some frozen meals. Doing more cooking at home now. Makes extra, freezes leftovers. This works best for her. Plenty of recipes.     Desire for crunchy carbs. Cheezits, individual packages; mixed nuts.     Dragon boating for about 2 more months.   Still going to the gym.  Walks dog 4x/day.       Intervention/Plan:     1. Increase self-awareness and accountability through self-monitoring.    - Follow up with RD for support      2. Consume a minimum of 5 cups of fruits/vegetables daily.   Focus on getting at least half of foods as fruits and non-starchy vegetables. Whole grains, lean meats/eggs, some dairy, nuts and seeds made up the remainder of the diet. Foods both high in calories and low in nutritive quality are consumed rarely (baked goods, fried foods, high fat/processed meats, sweets).     - batch cook + freeze extra for meals  - other snacks: roasted chick peas, Bada Bean Bada Boom     3. Progress to meet recommendation for 150+ minutes of physical activity for general health, while increasing to 300+ minutes weekly for continued weight loss or increased success for weight maintenance.   - gym about 2-3 days/week for cardio + strength training  - dragon boating 2-3x/week     4. Increase activities of daily living and reduce sedentary time.    Step goal: 10,000 steps/day  - walk to mailbox, use bathroom farther away, take stairs, park in back of parking lot   - dog walks + dog park counts!     5. Make modifications to environment to support healthy lifestyle changes.          Nutrition Monitoring and Evaluation:     Time spent with patient: 20 minutes    Follow up in 6 weeks     Contact information provided for questions     Thank you,    Mallie Duncan, RD LD

## 2024-04-19 NOTE — Patient Instructions
 1. Increase self-awareness and accountability through self-monitoring.    - Follow up with RD for support      2. Consume a minimum of 5 cups of fruits/vegetables daily.   Focus on getting at least half of foods as fruits and non-starchy vegetables. Whole grains, lean meats/eggs, some dairy, nuts and seeds made up the remainder of the diet. Foods both high in calories and low in nutritive quality are consumed rarely (baked goods, fried foods, high fat/processed meats, sweets).     - batch cook + freeze extra  - other snacks: roasted chick peas, Bada Bean Bada Boom     3. Progress to meet recommendation for 150+ minutes of physical activity for general health, while increasing to 300+ minutes weekly for continued weight loss or increased success for weight maintenance.   - gym about 2-3 days/week for cardio + strength training  - dragon boating 2-3x/week     4. Increase activities of daily living and reduce sedentary time.    Step goal: 10,000 steps/day  - walk to mailbox, use bathroom farther away, take stairs, park in back of parking lot   - dog walks + dog park counts!

## 2024-04-20 ENCOUNTER — Encounter: Admit: 2024-04-20 | Discharge: 2024-04-20 | Payer: MEDICARE

## 2024-04-28 ENCOUNTER — Encounter: Admit: 2024-04-28 | Discharge: 2024-04-28 | Payer: MEDICARE

## 2024-05-24 ENCOUNTER — Encounter: Admit: 2024-05-24 | Discharge: 2024-05-24 | Payer: MEDICARE

## 2024-05-24 MED ORDER — METFORMIN 500 MG PO TB24
1500 mg | ORAL_TABLET | Freq: Every day | ORAL | 0 refills | 90.00000 days | Status: AC
Start: 2024-05-24 — End: ?

## 2024-05-24 MED ORDER — MELOXICAM 7.5 MG PO TAB
7.5 mg | ORAL_TABLET | Freq: Every day | ORAL | 1 refills | 30.00000 days | Status: AC
Start: 2024-05-24 — End: ?

## 2024-05-24 MED ORDER — ANASTROZOLE 1 MG PO TAB
1 mg | ORAL_TABLET | Freq: Every day | ORAL | 1 refills | 33.00000 days | Status: AC
Start: 2024-05-24 — End: ?

## 2024-05-24 NOTE — Telephone Encounter
 Per office visit note from 02/2024, patient is to continue on this medication. Follow up visit with [facility] office is appropriately scheduled. Will send in refill to pharmacy as requested.

## 2024-05-24 NOTE — Telephone Encounter
 1. Received e-request from pharmacy requesting new Rx for   Requested Prescriptions     Pending Prescriptions Disp Refills    meloxicam  (MOBIC ) 7.5 mg tablet [Pharmacy Med Name: meloxicam  7.5 mg tablet] 90 tablet 1     Sig: TAKE ONE TABLET BY MOUTH DAILY   .    2. Last Rx written: 11/18/2023  (#90 x1) by PCP    3. Last Gen Med Visit: in person  on 03/19/2024    4. Next Appt due: Provider recommended return date from last office visit: follow up set on 07/05/2024    Future Appointments   Date Time Provider Department Center   06/08/2024 11:40 AM Augustin Mallie HERO, RD IMWEIGHT IM   07/05/2024 11:30 AM Flanagin, Danielle M, APRN-NP [facility] IM   09/13/2024  2:00 PM [facility] Radiolo   09/20/2024  8:15 AM [facility] - IC ROOM 1 IC1M[facility]M [facility] Radiolog   09/20/2024  9:00 AM O'Dea, Arlean SQUIBB, MD [id number] [place] Exam       5. All protocol criteria met? No, see below      This refill cannot be delegated    HGB in normal range and within 360 days       Courtesy Refills: N/A    Patient due for an office visit in 1,2, or 3 months 90 day supply, 0 refills   Patient due for an office visit in 4,5, or 6 months 90 day supply, 1 refill   Patient due for an office visit in 7,8, or 9 months 90 day supply, 2 refills   Patient due for an office visit in 10,11, or 12 months 90 day supply, 3 refills

## 2024-06-08 ENCOUNTER — Encounter: Admit: 2024-06-08 | Discharge: 2024-06-08 | Payer: MEDICARE

## 2024-06-08 VITALS — Wt 177.0 lb

## 2024-06-08 DIAGNOSIS — E66811 Obesity, Class I, BMI 30-34.9: Principal | ICD-10-CM

## 2024-06-08 NOTE — Patient Instructions [37]
 1. Increase self-awareness and accountability through self-monitoring.    - Follow up with RD for support      2. Consume a minimum of 5 cups of fruits/vegetables daily.   Focus on getting at least half of foods as fruits and non-starchy vegetables. Whole grains, lean meats/eggs, some dairy, nuts and seeds made up the remainder of the diet. Foods both high in calories and low in nutritive quality are consumed rarely (baked goods, fried foods, high fat/processed meats, sweets).     - for holiday eating: use plate for appetizers, be selective of leftovers you keep  - for dinners:    - ideally have dinner prepped/planned most of the time  - have some go-to meal ideas ready you can throw together with kitchen staples: adult lunchables/snack plates; breakfast meal (eggs, oatmeals)   - choose healthful restaurant meals (Mediterranean foods we discussed today are a great option     3. Progress to meet recommendation for 150+ minutes of physical activity for general health, while increasing to 300+ minutes weekly for continued weight loss or increased success for weight maintenance.   - gym 5 days/week for cardio + strength training  - water walking 1x/week     4. Increase activities of daily living and reduce sedentary time.    Step goal: 10,000 steps/day  - walk to mailbox, use bathroom farther away, take stairs, park in back of parking lot   - dog walks + dog park counts!     5. Make modifications to environment to support healthy lifestyle changes.   - remember motivation waxes & weans, focus on behaviors to help coast through the lows

## 2024-06-08 NOTE — Progress Notes [1]
 Primary goal of visit: Follow-up nutrition counseling visit for Intensive Behavioral Therapy for Weight Management    Nutrition Assessment of Patient:    Ht Readings from Last 1 Encounters:   03/23/24 160 cm (5' 3)       Wt Readings from Last 5 Encounters:   06/08/24 80.3 kg (177 lb)   04/19/24 79.8 kg (176 lb)   03/23/24 80.3 kg (177 lb)   03/19/24 80.6 kg (177 lb 12.8 oz)   02/17/24 82.8 kg (182 lb 9.6 oz)    *pt reported weight    Weight change: Lost 7 lbs since initial weight of 185 lbs on 03/24/23     Body mass index is 31.35 kg/m?[name]  BMI Categories Adult: class I obese   Total Energy Expenditure (based on Mifflin equation x activity factor): 1370 x 1.375 = 1870 kcal  Total Calorie Prescription for weight loss: 1200-1370 kcal  Needs to promote: weight loss and maintenance    Notes:     Less cooking at home d/t social life events. More dining out. Struggles with ordering the healthier options. Regardless of what she gets, still gets salad to start. Stops eating when satisfied.     Dragon boating over. Increased gym in morning with trainer.   Dog walks 4x/day. 1 day/week water walking against current.       Intervention/Plan:     1. Increase self-awareness and accountability through self-monitoring.    - Follow up with RD for support      2. Consume a minimum of 5 cups of fruits/vegetables daily.   Focus on getting at least half of foods as fruits and non-starchy vegetables. Whole grains, lean meats/eggs, some dairy, nuts and seeds made up the remainder of the diet. Foods both high in calories and low in nutritive quality are consumed rarely (baked goods, fried foods, high fat/processed meats, sweets).     - for holiday eating: use plate for appetizers, be selective of leftovers you keep  - for dinners:    - ideally have dinner prepped/planned most of the time  - have some go-to meal ideas ready you can throw together with kitchen staples: adult lunchables/snack plates; breakfast meal (eggs, oatmeals)   - choose healthful restaurant meals (Mediterranean foods we discussed today are a great option     3. Progress to meet recommendation for 150+ minutes of physical activity for general health, while increasing to 300+ minutes weekly for continued weight loss or increased success for weight maintenance.   - gym 5 days/week for cardio + strength training  - water walking 1x/week     4. Increase activities of daily living and reduce sedentary time.    Step goal: 10,000 steps/day  - walk to mailbox, use bathroom farther away, take stairs, park in back of parking lot   - dog walks + dog park counts!     5. Make modifications to environment to support healthy lifestyle changes.   - remember motivation waxes & weans, focus on behaviors to help coast through the lows      Nutrition Monitoring and Evaluation:     Time spent with patient: 30 minutes    Follow up in 6 week(s)    Contact information provided for questions     Thank you,    Mallie Duncan, RD LD

## 2024-06-28 ENCOUNTER — Ambulatory Visit: Admit: 2024-06-28 | Discharge: 2024-06-28 | Payer: MEDICARE

## 2024-06-28 ENCOUNTER — Encounter: Admit: 2024-06-28 | Discharge: 2024-06-28 | Payer: MEDICARE

## 2024-06-28 DIAGNOSIS — E66811 Obesity, Class I, BMI 30-34.9: Secondary | ICD-10-CM

## 2024-06-28 DIAGNOSIS — R799 Abnormal finding of blood chemistry, unspecified: Secondary | ICD-10-CM

## 2024-06-28 DIAGNOSIS — G4733 Obstructive sleep apnea (adult) (pediatric): Principal | ICD-10-CM

## 2024-06-28 DIAGNOSIS — C50412 Malignant neoplasm of upper-outer quadrant of left female breast: Secondary | ICD-10-CM

## 2024-06-28 NOTE — Patient Instructions [37]
 Alicia Sharp, It was great to see you today! Congratulations on your commitment to improved health and weight loss.      Your weight loss journey thus far:       Please call [phone] for appointment scheduling.    Questions: Please reach out to me directly on MyChart, discuss with your health educator (if this is part of your weight loss program), or call our nurse line at [phone]. Please allow up to 7 days for MyChart messages to be answered. To fax your results/records, our fax number is [phone].    MyChart allows you to use your mobile device or computer to communicate with your care team, see your lab results, view your after-visit summary, view your bill and make payments, schedule appointments and more. MyChart set up and assistance is available by calling [phone] or emailing [email or web address]    Medication Refills: Please use the MyChart Refill request or contact your pharmacy directly to request medication refills.  Please allow 72 hours.    Walk-in Laboratory services available at:  [facility]: [address], [place]  [place], [place] - 1st floor  Mon-Fri - 7 am-6 pm  Sat - 7 am-12 pm    [employer]: [address], [place], [place]  Mon-Fri - 8 am-4:30 pm    [facility]: [address] [place], [place]  Mon-Fri - 8 am-5 pm    [facility] [place]  [facility] [address]: [address], [place], [place]   Mon-Fri - 7 am-5:30 pm  Sat-Sun - 7 am-12 pm    [facility]   [address] [place], [place]  Mon-Fri - 8 am-4:30 pm  - For additional lab locations and updated hours visit [email or web address] or call [phone] (LABS)  - For fasting labs, do not eat for 8-10 hours before having your labs drawn. Please drink plenty of water, and make sure to continue your medications as prescribed.    Results: If you have an active MyChart account, expect to receive your results and advice about your results via MyChart at [email or web address] Urgent results will be called to you. Please ensure your address and phone number are correct and that your voicemail box is set up and not full.      We look forward to seeing you and take care!    The Weight Management Team

## 2024-06-28 NOTE — Progress Notes [1]
 [name]  Weight Management  Follow Up Visit    Date of Service: 06/28/2024  PCP: Lemon Houston  DOB: 1955/11/25  MRN#: [id number]    Alicia Sharp is a 68 y.o. female.        History of Present Illness  Alicia Sharp is a 68 y.o. female with  has a past medical history of Allergy, Arthritis, Back pain (6-24), Breast cancer (CMS-HCC) (07/2018), Hormone replacement therapy, Hyperlipidemia, Joint pain (Nov. 2014), Obesity, Obstructive sleep apnea (last month), Osteoarthritis (06/2013), Other malignant neoplasm without specification of site (Nov. 2019), Personal history of irradiation, PONV (postoperative nausea and vomiting) (06/2013), Seasonal allergic reaction, and Seasonal allergies. presenting for follow up discussion of obesity and weight management.      +1 lb since last WM visit Dr Andra 02/2024    Alicia Sharp is a 68 year old female who presents for weight management.    She is currently focused on weight management, weighing 178 pounds. She has been using metformin  to aid in weight loss but experienced gastrointestinal issues when taking three pills, leading her to reduce the dose to two pills. She is considering increasing the dose back to three pills by taking two in the evening and one in the morning to mitigate GI symptoms. Her target weight is 150 pounds, which she believes will alleviate her joint issues.    She has a history of breast cancer and is currently on Arimidex . Her A1c was 5.4 in November of last year.    In terms of nutrition, she is increasing her protein intake with Fairlife supplements and incorporating more fruits and vegetables, particularly those high in antioxidants, into her diet.    Her physical activity has decreased since her [employer] team stopped for the season. She tries to go to the gym five days a week for strength and cardio exercises and walks her dog three to four times a day for 30 to 45 minutes each walk. She does not actively track her steps.    She has mild sleep apnea, primarily causing snoring.    Review of Systems   Constitution: Positive for weight maintenance  GI: negative for GI upset    Objective:     Physical Exam  Constitutional: Well developed, well nourished, no distress   HENT: Normocephalic, atraumatic  Respiratory: Normal effort  Neurologic: Alert  Psych: mood /affect normal. Behavior normal. Thought content normal. Judgement normal.    Medications-   anastrozole  (ARIMIDEX ) 1 mg tablet TAKE ONE TABLET BY MOUTH EVERY DAY    atorvastatin  (LIPITOR) 40 mg tablet TAKE 1/2 TABLET BY MOUTH EVERY DAY    calcium carbonate (TUMS PO) Take  by mouth. Twice a day    CHOLEcalciferoL (vitamin D3) (VITAMIN D3) 5000 unit tablet Take one tablet by mouth.    cyanocobalamin  (vitamin B-12) (VITAMIN B-12) 1,000 mcg tablet Take one tablet by mouth daily. Indications: prevention of vitamin B12 deficiency    ferrous sulfate  (FEOSOL) 325 mg (65 mg iron ) tablet Take one tablet by mouth daily. Take on an empty stomach at least 1 hour before or 2 hours after food.    fexofenadine (ALLEGRA) 180 mg tablet Take one tablet by mouth daily.    fluticasone  propionate (FLONASE  ALLERGY RELIEF) 50 mcg/actuation nasal spray, suspension Apply two sprays to each nostril as directed twice daily.    meloxicam  (MOBIC ) 7.5 mg tablet TAKE ONE TABLET BY MOUTH DAILY    metFORMIN -XR (GLUCOPHAGE  XR) 500 mg extended release tablet TAKE  THREE TABLETS BY MOUTH DAILY WITH dinner    ONE DAILY MULTIVITAMIN PO      Recent Labs:  Basic Metabolic Profile    Lab Results   Component Value Date/Time    NA 138 07/08/2023 01:44 PM    K 3.9 07/08/2023 01:44 PM    CA 8.9 07/08/2023 01:44 PM    CL 104 07/08/2023 01:44 PM    CO2 27 07/08/2023 01:44 PM    GAP 8 07/08/2023 01:44 PM    Lab Results   Component Value Date/Time    BUN 21 07/08/2023 01:44 PM    CR 0.75 07/08/2023 01:44 PM    GLU 83 07/08/2023 01:44 PM        Hepatic Function    Lab Results   Component Value Date/Time    ALBUMIN 4.1 07/08/2023 01:44 PM    TOTPROT 6.9 07/08/2023 01:44 PM    ALKPHOS 80 07/08/2023 01:44 PM    Lab Results   Component Value Date/Time    AST 19 07/08/2023 01:44 PM    ALT 16 07/08/2023 01:44 PM    TOTBILI 0.3 07/08/2023 01:44 PM        TSH   Lab Results   Component Value Date/Time    TSH 1.14 12/25/2020 09:14 AM        Lab Draw:  Lab Results   Component Value Date/Time    HGBA1C 5.4 07/08/2023 01:44 PM     POC:  Lab Results   Component Value Date/Time    A1C 5.3 10/23/2020 12:00 AM    A1C 5.3 01/18/2019 12:00 AM      Lab Results   Component Value Date    CHOL 138 07/08/2023    TRIG 193 (H) 07/08/2023    HDL 53 07/08/2023    LDL 75 07/08/2023    VLDL 61.3 07/08/2023    NONHDLCHOL 85 07/08/2023      Wt Readings from Last 5 Encounters:   06/08/24 80.3 kg (177 lb)   04/19/24 79.8 kg (176 lb)   03/23/24 80.3 kg (177 lb)   03/19/24 80.6 kg (177 lb 12.8 oz)   02/17/24 82.8 kg (182 lb 9.6 oz)     BP Readings from Last 3 Encounters:   03/19/24 134/54   02/17/24 123/68   11/24/23 129/58      Telehealth Patient Reported Vitals       Row Name 06/28/24 1418                Pulse: 71        Weight: 81 kg (178 lb 9.6 oz)        Height: 160 cm (5' 3)           Telehealth Body Mass Index: [id number] at 06/28/2024  3:56 PM       Assessment and Plan:  Obesity-   Initial weight & BMI: 187 # Telehealth Body Mass Index: 32.17 at 02/21/2023 12:51 PM  Today's weight & BMI: 178 # Telehealth Body Mass Index: [id number] at 06/28/2024  3:56 PM  Total weight loss to date: 9 #,  4.8 %TBWL     Overall Goal: 150 lbs    Treatment:  Plan/ level of monitoring: Nutrition Counseling and Pharmacotherapy Last visit RD 05/2024    Current Medication -   Increase metformin  XR back up to 1500mg /day. Okay to trial split day dosing. Discussed potentially adding GLP1, however prefers to avoid for now.         Failed medication - none  Discussion of safety/side effects - Reviewed     Further obesity management recommendations:   Annual screen for a assessment of Type II diabetes, HTN, Hyperlipidemia, Depression, OSA, NAFLD, Vitamin D deficiency and renal disease.   Given the association of obesity with cancer, all age appropriate cancer screenings should be UTD  Medication Review: If possible medication that promote weight gain should be avoided and medication that are weight-neutral or promote weight loss should be considered.     Comorbid Conditions:  HLD  H/o breast cancer on Armidex  OSA Mild - no CPAP    Total of 30 minutes were spent on the same day of the visit including preparing to see the patient, obtaining and/or reviewing separately obtained history, performing a medically appropriate examination and/or evaluation, counseling and educating the patient/family/caregiver, documenting clinical information in the electronic or other health record, independently interpreting results and communicating results to the patient/family/caregiver, and care coordination.     Future Appointments   Date Time Provider Department Center   07/05/2024 11:30 AM Flanagin, Danielle M, APRN-NP [facility] IM   07/20/2024 11:40 AM Augustin Mallie HERO, RD IMWEIGHT IM   09/13/2024  2:00 PM [facility] Radiolo   09/20/2024  8:15 AM [facility] - IC ROOM 1 IC1MAMM ICC Radiolog   09/20/2024  9:00 AM O'Dea, Arlean SQUIBB, MD [facility] [place] Exam

## 2024-07-05 ENCOUNTER — Encounter: Admit: 2024-07-05 | Discharge: 2024-07-05 | Payer: MEDICARE

## 2024-07-05 ENCOUNTER — Ambulatory Visit: Admit: 2024-07-05 | Discharge: 2024-07-06 | Payer: MEDICARE

## 2024-07-05 VITALS — BP 137/74 | HR 67 | Temp 99.00000°F | Resp 14 | Ht 63.0 in | Wt 179.4 lb

## 2024-07-05 DIAGNOSIS — Z0000 Encounter for general adult medical examination without abnormal findings: Principal | ICD-10-CM

## 2024-07-05 DIAGNOSIS — R0683 Snoring: Secondary | ICD-10-CM

## 2024-07-05 DIAGNOSIS — M8580 Other specified disorders of bone density and structure, unspecified site: Secondary | ICD-10-CM

## 2024-07-05 DIAGNOSIS — C50412 Malignant neoplasm of upper-outer quadrant of left female breast: Secondary | ICD-10-CM

## 2024-07-05 DIAGNOSIS — Z23 Encounter for immunization: Secondary | ICD-10-CM

## 2024-07-05 DIAGNOSIS — E7800 Pure hypercholesterolemia, unspecified: Secondary | ICD-10-CM

## 2024-07-05 DIAGNOSIS — Z1283 Encounter for screening for malignant neoplasm of skin: Secondary | ICD-10-CM

## 2024-07-05 NOTE — Patient Instructions [37]
As part of your Medicare benefits you are now entitled to a subsequent Annual Medicare Wellness Visit to review the medical care you are currently receiving, assess your level of risk for developing medical problems in specific areas defined by Medicare, and to review your personal plan for health screenings and other preventive services. Medicare covers one wellness visit per year, please contact us at [phone] to schedule your next Annual Wellness Visit.      Please understand that this is NOT an annual physical, nor is it a visit designed to address immediate or chronic medical problems you may be experiencing. Rather, it is a visit designed by the Center for Medicare Services (CMS) specifically to address Disease Prevention and Health Promotion. This is a preventive service for you that is covered by Medicare & will not cost you any copays.    There are very specific elements in the Prevention Plan Services visit that we are required to address by Medicare. These topics include the following:   Comprehensive medical history review   Review of other providers a patient is seeing for health care   Thorough medication review   Health risk assessment for items to include functional ability, memory, vision, and hearing   Blood pressure, and weight and height to measure your body mass index   Review of preventive screenings and services, such as cancer screening and shots   An option to discuss end of life planning, including a discussion of Advance Directives   Development of a personalized health promotion plan    At this visit you will receive a written plan to identify preventive services/screenings and other health promoting items.  We want to provide you with all the services that are covered by your Medicare benefits. We would like to work together to maintain and improve your health, and are excited our practice is now able to provide you with this service.    Sincerely, Steward Ros, APRN-NP    Your healthcare team at [facility]     Patient Education Handout  Fire Safety  The Facts:   Cooking is the primary cause of home fires.   Smoking is the leading cause of fire-related deaths.   80% of [place] fire deaths occur in home fires.   50% of home fire deaths occur in homes without smoke alarms.   Most home fires occur during winter months.   Alcohol contributes to an estimated 40% of home related fire deaths.  Who is at greatest risk?   Children under the age of 25.   Adults 65 and older.   African Americans and Native Americans.   Persons living in rural areas.   Persons living in manufactured homes or substandard housing.  What can you do?   Be sure your home is equipped with a functioning Smoke and Carbon Monoxide alarm.   Do not smoke.   Do not drink to excess.   Monitor your stove, oven, and kitchen appliances.   Have a functioning fire extinguisher in your kitchen.    Driving Safety  The Facts:   Motor vehicle-related deaths and injuries among older adults are rising.   Drivers 65 and older have higher crash death rates per mile than all but teen drivers.   The 69 and older population is the fastest growing segment of the population.   Older drivers who are injured in a motor vehicle accident are more likely than younger drivers to die of their injuries.  Rates for motor vehicle-related injury are twice as high for older men than for older women.  What can you do?   Wear you seatbelt in the car -- all the time.   Be sure that your vision and hearing have been tested and are satisfactory.   Do not drink and drive.   Talk with family about your driving skills and consider their advice when assessing your driving skills and safety.   Do not talk on a cell phone and drive at the same time.    Suicide Risk  The Facts:   Suicide rates increase with age and rates are high among those over 29.   Older adults who are suicidal are also more likely to be suffering from   Physical illnesses and to be divorced or widowed.   Older men are more likely to commit suicide than older women.   Firearms are used in the majority of suicides committed by older adults.  What can you do?   Seek care if you are depressed.   Seek social supports such as family, church, [employer], and friends if you are ill, divorced, or living alone.  Remember that depression is a medical illness and not a personal failing or weakness.  There are effective treatments for depression and your medical providers are interested in helping you if you are depressed.    Osteoporosis Prevention  The Facts:   Osteoporosis is more common among older adults.   Women have a higher rate of osteoporosis than men.   The presence of osteoporosis increases the risk of injury from a fall.   Smoking is a risk factor for the development of osteoporosis.  What can you do?   Participate in a regular exercise program such as walking   Be sure that you are taking 1,000 to 1,500 mg of calcium per day, preferably with vitamin D   Do not smoke.   Do not drink alcohol to excess.   Consider having a bone density test to look for osteoporosis.    Falls Prevention  The Facts:   More than one-third of adults 104 and older fall each year.   Among older Americans, falls are the leading cause of injury deaths and the most common cause of non-fatal injury and hospital admission for trauma.   Of those older adults who fall, 20% to 30% suffer moderate to severe injuries such as hip fractures or head trauma that reduce mobility and independence, and increase the risk of premature death.   Falls are the leading cause of traumatic brain injuries.   Among older adults, the majority of fractures are caused by falls.   White men have the highest fall-related death rates, followed by white women, black men, and then black women.   Women sustain about 80% of all hip fractures.   Of all fall-related fractures, hip fractures cause the greatest number of deaths and lead to the most severe health problems and reduced quality of life.   Up to 25% of community-dwelling older adults who sustain a hip fracture remain institutionalized for at least one year.  Who is at risk?   As noted above, adults over the age of 48 have an increased risk, but that risk is even higher among those older than 40.   Those with lower body weakness.   Those with problems walking and balance.   Those who are on four or more medications or any psychoactive medications.   Those who drink excessively.  What can you do?   Increase lower body strength and balance through regular physical activity and exercise.   Review all of your medications with your provider regularly to see if any can be eliminated or the dose reduced.   Have your vision checked regularly.   Remove tripping hazards in your home such as clutter in the hallways and on the stairs.   Remove throw rugs.   Use non-slip mats in the bathtub and on shower floors.   Have grab bars next to the toilet and in the tub or shower.   Have handrails on both sides of the stairway   Be sure that the lighting is adequate throughout your home.   Do not drink alcohol to excess.   If you require the assistance of a cane or walker, use it all the time.

## 2024-07-05 NOTE — Progress Notes [1]
 Date of Service: 07/05/2024    Alicia Sharp is a 68 y.o. female.  DOB: [birth year]  MRN: [id number]     SUBJECTIVE       She presents today for an Annual Medicare Wellness visit.   Chief Complaint   Patient presents with    Annual Wellness Visit     Has some perineal dry and or scaly, is it time to see a dermatologist in general for sun damage?          Patient presents to clinic today for annual Medicare wellness visit.  She reports she is doing well and does not have any major concerns today.  She would like a Dermatology referral, as she has a couple of dry scaly spots, one on her forehead and another on her right rib cage area that have persisted and she would just like them checked out.  She has a history of breast cancer and follows with oncology, currently on Arimidex . She is taking a vitamin D and calcium supplement. She has osteopenia and has her DEXA scan scheduled already for early 2026. She sees Ophthalmology regularly, dentist regularly.  She works with it sales professional, focusing on eating healthy whole foods.  She states she has not been gaining any weight, but is just maintaining. She is pretty physically active, works as a engineer, civil (consulting).  She typically drinks a glass of wine per night.  She does not use any tobacco or illicit drugs.  She is not currently sexually active. She is not needing any refills on medications today and does not have any other concerns that she wants to address.    Problem List Review     I have reviewed and updated the problem list below and addressed acute and chronic conditions with patient or recommended a follow up plan.   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    Pure hypercholesterolemia Health care maintenance    Paresthesias    Malignant neoplasm of upper-outer quadrant of left breast in female, estrogen receptor positive (CMS-HCC)        Risk Review   Lab Draw:  Lab Draw:  Lab Results   Component Value Date/Time    HGBA1C 5.4 07/08/2023 01:44 PM     POC:  Lab Results   Component Value Date/Time    A1C 5.3 10/23/2020 12:00 AM    A1C 5.3 01/18/2019 12:00 AM        Lab Results   Component Value Date    CHOL 138 07/08/2023    LDL 75 07/08/2023     The 89-bzjm ASCVD risk score (Arnett DK, et al., 2019) is: 8.6%    Values used to calculate the score:      Age: 87 years      Clinically relevant sex: Female      Is Non-Hispanic African American: No      Diabetic: No      Tobacco smoker: No      Systolic Blood Pressure: 137 mmHg      Is BP treated: No      HDL Cholesterol: 53 mg/dL      Total Cholesterol: 138 mg/dL  Preventive Medications   Statin assessment: I have reviewed the patient's ASCVD risk and patient: Patient is on statin    Aspirin assessment: Patient does not have a history of cardiovascular disease and after risk benefit assessment, aspirin not indicated.    Preventive Screening   I reviewed the preventive care and immunizations information with the patient and discussed a plan of care.    Health Maintenance   Topic Date Due    COVID-19 VACCINE (9 - 2025-26 season) 04/12/2024    BREAST CANCER SCREENING  07/21/2024    MEDICARE ANNUAL WELLNESS VISIT  07/05/2025    ADVANCE CARE PLANNING DISCUSSION AND DOCUMENTATION  07/05/2025    DTAP/TDAP VACCINES (2 - Td or Tdap) 09/07/2025    COLORECTAL CANCER SCREENING  04/06/2029    OSTEOPOROSIS SCREENING/MONITORING  Completed    SHINGLES RECOMBINANT VACCINE  Completed    HEPATITIS C SCREENING  Completed    RSV VACCINE (50 years and Older/Pregnant Women)  Completed    DEPRESSION SCREENING  Completed    INFLUENZA VACCINE  Completed    PNEUMOCOCCAL VACCINE AGE 68 AND OVER  Addressed    HPV VACCINES  Aged Out    MENINGOCOCCAL B VACCINE  Aged Out    CERVICAL CANCER SCREENING  Discontinued       Health Risk Assessment Questionnaire     The patient completed a health risk assessment with results reviewed and addressed with patient.    Health Risk Assessment Questionnaire  Current Care  List of Providers you have seen in the last two years: all through [facility] and in MyChart  Are you receiving home health?: No  During the past 4 weeks, how would you rate your health in general?: Excellent    Outside Care  Since your last PCP visit, have you received care outside of [facility]?: No     Physical Activity  Do you exercise or are you physically active?: Yes  How many days a week do you usually exercise or are physically active?: 5  On days when you exercised or were physically active, how many minutes was the activity?: 40  During the past four weeks, what was the hardest physical activity you could do for at least two minutes?: Heavy    Diet  In the past month, were you worried whether your food would run out before you or your family had money to buy more?: No  In the past 7 days, how many times did you eat fast food or junk food or pizza?: (!) 2  In the past 7 days, how many servings of fruits or vegetables did you eat each day?: 4-5  In the past 7 days, how many sodas and sugar sweetened drinks (regular, not diet) did you drink each day?: 0    Smoke/Tobacco Use  Are you currently a smoker?: No     Alcohol Use  Do you drink alcohol?: Yes  Are you Female or Female?: Female     Female: In the last three months, have you had >3 alcoholic beverages in any one day or >7 in any one week?: No    Other Symptoms  How would you rate your pain today?: No pain  Do you feel tired during the day?: No  Do you feel stress - tense, restless, nervous, anxious - or unable to sleep at night because your mind is troubled?: No  Have you often felt angry or had difficulty controlling your temper?: No  Do you  often feel lonely?: No    Ambulation  Do you use any assistive devices for ambulation?: No     Fall Risk  Does it take you longer than 30 seconds to get up and out of a chair?: No  Have you fallen in the past year?: No    Motor Vehicle Safety  Do you fasten your seat belt when you are in the car?: Yes    Sun Exposure  Do you protect yourself from the sun? For example, wear sunscreen when outside.: Yes    Hearing Loss  Do you have trouble hearing the television or radio when others do not?: No  Do you have to strain or struggle to hear/understand conversation?: No  Do you use hearing aids?: No    Cognitive Impairment  During the past 12 months, have you experienced confusion or memory loss that is happening more often or is getting worse?: No    Functional Screen  Do you live alone?: Yes  Do you live at: Home  Can you drive your own car or travel alone by bus or taxi?: Yes  Can you shop for groceries or clothes without help?: Yes  Can you prepare your own meals?: Yes  Can you do your own housework (such as laundry or other household tasks) without help?: Yes  Can you handle your own money without help?: Yes  Do you need help eating, bathing, dressing, or getting around your home?: (!) Yes  Do you feel safe?: Yes  Does anyone at home hurt you, hit you, or threaten you?: No  Have you ever been the victim of abuse?: No    Home Safety  Does your home have grab bars in the bathroom?: (!) No  Does your home have hand rails on stairs and steps?: Yes  Does your home have functioning smoke alarms?: Yes    Advance Directive  Do you have a living will or Advance Directive?: Yes     Dental Screen  Have you had an exam by your dentist in the last year?: Yes    Vision Screen  Do you have diabetes?: No     Urinary Incontinence  Have you had urine leakage in the past 6 months?: (!) Yes  Does your urine leakage negatively impact your daily activities or sleep?: No    Social Drivers of Health            History Review   Past Medical History[1]  Surgical History[2]  Social History     Tobacco Use Smoking status: Never    Smokeless tobacco: Never    Tobacco comments:     briefly in college   Substance and Sexual Activity    Alcohol use: Yes     Alcohol/week: 4.0 standard drinks of alcohol     Types: 4 Glasses of wine per week     Comment: 1 glass of wine/evening    Drug use: Never    Sexual activity: Not Currently     Partners: Male     Birth control/protection: Post-menopausal     Family History[3]              Allergies[4]  Review of Systems       OBJECTIVE     Vitals:    07/05/24 1138   BP: 137/74   BP Source: Arm, Left Upper   Pulse: 67   Temp: 37.2 ?C (99 ?F)   Resp: 14   SpO2: 99%   TempSrc: Oral  PainSc: Zero   Weight: 81.4 kg (179 lb 6.4 oz)   Height: 160 cm (5' 3)     Body mass index is 31.78 kg/m?[name]   Physical Exam  Vitals reviewed.   Constitutional:       Appearance: Normal appearance.   HENT:      Head: Normocephalic and atraumatic.      Right Ear: Tympanic membrane normal.      Left Ear: Tympanic membrane normal.      Nose: Nose normal.      Mouth/Throat:      Mouth: Mucous membranes are moist.      Pharynx: Oropharynx is clear.   Eyes:      Extraocular Movements: Extraocular movements intact.      Conjunctiva/sclera: Conjunctivae normal.      Pupils: Pupils are equal, round, and reactive to light.   Neck:      Thyroid: No thyromegaly.   Cardiovascular:      Rate and Rhythm: Normal rate and regular rhythm.      Pulses: Normal pulses.           Radial pulses are 2+ on the right side and 2+ on the left side.        Posterior tibial pulses are 2+ on the right side and 2+ on the left side.      Heart sounds: Normal heart sounds.   Pulmonary:      Effort: Pulmonary effort is normal. No respiratory distress.   Abdominal:      General: Abdomen is flat. Bowel sounds are normal. There is no distension.      Palpations: Abdomen is soft.      Tenderness: There is no abdominal tenderness. There is no right CVA tenderness or left CVA tenderness.   Musculoskeletal:         General: No swelling. Normal range of motion.      Cervical back: Normal range of motion. No rigidity.      Right lower leg: No edema.      Left lower leg: No edema.   Lymphadenopathy:      Cervical: No cervical adenopathy.   Skin:     General: Skin is warm and dry.      Findings: No bruising or rash.   Neurological:      General: No focal deficit present.      Mental Status: She is alert and oriented to person, place, and time. Mental status is at baseline.      Cranial Nerves: No cranial nerve deficit.      Motor: No weakness.   Psychiatric:         Mood and Affect: Mood normal.         Speech: Speech normal.            Depression Screening   Over the past 2 weeks, how often have you been bothered by any of the following problems?  Little interest or pleasure in doing things: (Patient-Rptd) 0  Feeling down, depressed or hopeless: (Patient-Rptd) 0  PHQ-2 Score: (Patient-Rptd) 0        Mini-Cog  Steps:   1. Tell patient 3 words.   2. Patient draws clock at 11:10: Normal clock, 2 points  3. Patient recalls words: Recalled 3 words, 3 points  Score and follow up: 3-5 points - lower risk of dementia, referral on case-by-case basis    Get-up-and-go Test  Less than 12 seconds, normal    Medications   I have  completed a medication reconciliation, discussed medication adherence, and reviewed the list for high-risk medications (BEERS) and results are below:      anastrozole  (ARIMIDEX ) 1 mg tablet TAKE ONE TABLET BY MOUTH EVERY DAY    atorvastatin  (LIPITOR) 40 mg tablet TAKE 1/2 TABLET BY MOUTH EVERY DAY    calcium carbonate (TUMS PO) Take  by mouth. Twice a day    CHOLEcalciferoL (vitamin D3) (VITAMIN D3) 5000 unit tablet Take one tablet by mouth.    cyanocobalamin  (vitamin B-12) (VITAMIN B-12) 1,000 mcg tablet Take one tablet by mouth daily. Indications: prevention of vitamin B12 deficiency    ferrous sulfate  (FEOSOL) 325 mg (65 mg iron ) tablet Take one tablet by mouth daily. Take on an empty stomach at least 1 hour before or 2 hours after food. fexofenadine (ALLEGRA) 180 mg tablet Take one tablet by mouth daily.    fluticasone  propionate (FLONASE  ALLERGY RELIEF) 50 mcg/actuation nasal spray, suspension Apply two sprays to each nostril as directed twice daily.    meloxicam  (MOBIC ) 7.5 mg tablet TAKE ONE TABLET BY MOUTH DAILY    metFORMIN -XR (GLUCOPHAGE  XR) 500 mg extended release tablet TAKE THREE TABLETS BY MOUTH DAILY WITH dinner    ONE DAILY MULTIVITAMIN PO           Assessment & Plan  Encounter for subsequent annual wellness visit (AWV) in Medicare patient    Orders:    CBC; Future    COMPREHENSIVE METABOLIC PANEL; Future    HEMOGLOBIN A1C; Future    LIPID PROFILE; Future    TSH WITH FREE T4 REFLEX; Future    VITAMIN B12; Future    25-OH VITAMIN D (D2 + D3); Future    Pure hypercholesterolemia         Malignant neoplasm of upper-outer quadrant of left breast in female, estrogen receptor positive (CMS-HCC)         Osteopenia, unspecified location    Orders:    25-OH VITAMIN D (D2 + D3); Future    Loud snoring  Mild apnea, no cpap.       Flu vaccine need    Orders:    FLU VACCINE =>64 YO HIGH-DOSE TRIVALENT PF    Skin cancer screening    Orders:    AMB REFERRAL TO DERMATOLOGY    Encounter for subsequent annual wellness visit (AWV) in Medicare patient (Primary)  -Patient presents to clinic for annual wellness visit.  She is doing well overall, has no major concerns today.  -Age-appropriate health maintenance reviewed.  -Yearly labs ordered, patient not fasting today so will obtain on another date.  - CBC; Future  - COMPREHENSIVE METABOLIC PANEL; Future  - HEMOGLOBIN A1C; Future  - LIPID PROFILE; Future  - TSH WITH FREE T4 REFLEX; Future  - VITAMIN B12; Future  - 25-OH VITAMIN D (D2 + D3); Future  -Return to clinic in 1 year for annual wellness visit.    Pure hypercholesterolemia  -Patient is on a statin, continue taking atorvastatin  20 mg daily  -Recheck lipid panel with next lab draw.    Malignant neoplasm of upper-outer quadrant of left breast in female, estrogen receptor positive (CMS-HCC)  -Patient with history of breast cancer, following regularly with Oncology. Next appointment on 09/20/2024.  -Continue taking Arimidex  1 mg daily.    Osteopenia, unspecified location  -Patient with osteopenia, receives yearly DEXA scans with breast cancer history, last done on 02/23/2024. She plans to schedule her next one for 2026.  -Continue taking vitamin D 5000 units daily, calcium  supplement daily.  Recheck vitamin D level.  - 25-OH VITAMIN D (D2 + D3); Future    Loud snoring  - Previous sleep study showed mild apnea, patient does not wear CPAP.    Skin cancer screening  -Patient wanting a dermatology referral for a couple of dry scaly spots that have been persisting for a while, one on her forehead and one on her right rib cage.  She does not report that they have changed at all, but have just remained dry despite frequent moisturizing.  Referral to dermatology placed.  - AMB REFERRAL TO DERMATOLOGY    Flu vaccine need  -Flu vaccine given today in clinic.  - FLU VACCINE =>64 YO HIGH-DOSE TRIVALENT PF    Personal prevention plan reviewed with patient and is accessible via patient's After Visit Summary and visit note.    Patient evaluated and treated today alongside APP student [facility].   I personally performed or reperformed the history, physical exam and treatment for the E/M.  I discussed the case with the APRN student and concur with the APRN student documentation of history, physical and treatment plan unless otherwise noted.  Alexx Giambra M Maureen Delatte, APRN-NP        There are no discontinued medications.  Patient Instructions   As part of your Medicare benefits you are now entitled to a subsequent Annual Medicare Wellness Visit to review the medical care you are currently receiving, assess your level of risk for developing medical problems in specific areas defined by Medicare, and to review your personal plan for health screenings and other preventive services. Medicare covers one wellness visit per year, please contact us  at [phone] to schedule your next Annual Wellness Visit.      Please understand that this is NOT an annual physical, nor is it a visit designed to address immediate or chronic medical problems you may be experiencing. Rather, it is a visit designed by the Center for Medicare Services (CMS) specifically to address Disease Prevention and Health Promotion. This is a preventive service for you that is covered by Medicare & will not cost you any copays.    There are very specific elements in the Prevention Plan Services visit that we are required to address by Medicare. These topics include the following:   Comprehensive medical history review   Review of other providers a patient is seeing for health care   Thorough medication review   Health risk assessment for items to include functional ability, memory, vision, and hearing   Blood pressure, and weight and height to measure your body mass index   Review of preventive screenings and services, such as cancer screening and shots   An option to discuss end of life planning, including a discussion of Advance Directives   Development of a personalized health promotion plan    At this visit you will receive a written plan to identify preventive services/screenings and other health promoting items.  We want to provide you with all the services that are covered by your Medicare benefits. We would like to work together to maintain and improve your health, and are excited our practice is now able to provide you with this service.    Sincerely, Mack Alvidrez M Brody Bonneau, APRN-NP    Your healthcare team at [facility]     Patient Education Handout  Fire Safety  The Facts:   Cooking is the primary cause of home fires.   Smoking is the leading cause of fire-related deaths.  80% of [place] fire deaths occur in home fires.   50% of home fire deaths occur in homes without smoke alarms.   Most home fires occur during winter months.   Alcohol contributes to an estimated 40% of home related fire deaths.  Who is at greatest risk?   Children under the age of 58.   Adults 65 and older.   African Americans and Native Americans.   Persons living in rural areas.   Persons living in manufactured homes or substandard housing.  What can you do?   Be sure your home is equipped with a functioning Smoke and Carbon Monoxide alarm.   Do not smoke.   Do not drink to excess.   Monitor your stove, oven, and kitchen appliances.   Have a functioning fire extinguisher in your kitchen.    Driving Safety  The Facts:   Motor vehicle-related deaths and injuries among older adults are rising.   Drivers 65 and older have higher crash death rates per mile than all but teen drivers.   The 19 and older population is the fastest growing segment of the population.   Older drivers who are injured in a motor vehicle accident are more likely than younger drivers to die of their injuries.   Rates for motor vehicle-related injury are twice as high for older men than for older women.  What can you do?   Wear you seatbelt in the car -- all the time.   Be sure that your vision and hearing have been tested and are satisfactory.   Do not drink and drive.   Talk with family about your driving skills and consider their advice when assessing your driving skills and safety.   Do not talk on a cell phone and drive at the same time.    Suicide Risk  The Facts:   Suicide rates increase with age and rates are high among those over 53.   Older adults who are suicidal are also more likely to be suffering from   Physical illnesses and to be divorced or widowed.   Older men are more likely to commit suicide than older women.   Firearms are used in the majority of suicides committed by older adults.  What can you do?   Seek care if you are depressed.   Seek social supports such as family, church, entergy corporation, and friends if you are ill, divorced, or living alone.  Remember that depression is a medical illness and not a personal failing or weakness.  There are effective treatments for depression and your medical providers are interested in helping you if you are depressed.    Osteoporosis Prevention  The Facts:   Osteoporosis is more common among older adults.   Women have a higher rate of osteoporosis than men.   The presence of osteoporosis increases the risk of injury from a fall.   Smoking is a risk factor for the development of osteoporosis.  What can you do?   Participate in a regular exercise program such as walking   Be sure that you are taking 1,000 to 1,500 mg of calcium per day, preferably with vitamin D   Do not smoke.   Do not drink alcohol to excess.   Consider having a bone density test to look for osteoporosis.    Falls Prevention  The Facts:   More than one-third of adults 57 and older fall each year.   Among older Americans, falls are the leading cause of injury  deaths and the most common cause of non-fatal injury and hospital admission for trauma.   Of those older adults who fall, 20% to 30% suffer moderate to severe injuries such as hip fractures or head trauma that reduce mobility and independence, and increase the risk of premature death.   Falls are the leading cause of traumatic brain injuries.   Among older adults, the majority of fractures are caused by falls.   White men have the highest fall-related death rates, followed by white women, black men, and then black women.   Women sustain about 80% of all hip fractures.   Of all fall-related fractures, hip fractures cause the greatest number of deaths and lead to the most severe health problems and reduced quality of life.   Up to 25% of community-dwelling older adults who sustain a hip fracture remain institutionalized for at least one year.  Who is at risk?   As noted above, adults over the age of 70 have an increased risk, but that risk is even higher among those older than 43.   Those with lower body weakness.   Those with problems walking and balance.   Those who are on four or more medications or any psychoactive medications.   Those who drink excessively.  What can you do?   Increase lower body strength and balance through regular physical activity and exercise.   Review all of your medications with your provider regularly to see if any can be eliminated or the dose reduced.   Have your vision checked regularly.   Remove tripping hazards in your home such as clutter in the hallways and on the stairs.   Remove throw rugs.   Use non-slip mats in the bathtub and on shower floors.   Have grab bars next to the toilet and in the tub or shower.   Have handrails on both sides of the stairway   Be sure that the lighting is adequate throughout your home.   Do not drink alcohol to excess.   If you require the assistance of a cane or walker, use it all the time.                   [1]   Past Medical History:  Diagnosis Date    Allergy     seasonal    Arthritis     Back pain 6-24    Breast cancer (CMS-HCC) 07/2018    left breast    Hormone replacement therapy     Hyperlipidemia     Joint pain Nov. 2014    L knee    Obesity     Obstructive sleep apnea last month    Osteoarthritis 06/2013    Other malignant neoplasm without specification of site Nov. 2019    L breast, stage 1, lumpectomy, EP+    Personal history of irradiation     PONV (postoperative nausea and vomiting) 06/2013    after 2014 knee scope    Seasonal allergic reaction     Seasonal allergies    [2]   Past Surgical History:  Procedure Laterality Date    BREAST SURGERY  1-20    lumpectomy    COLONOSCOPY N/A 04/07/2019    COLONOSCOPY DIAGNOSTIC WITH SPECIMEN COLLECTION BY BRUSHING/ WASHING - FLEXIBLE performed by Verlon Lynwood SAUNDERS, MD at [facility] OR    HX ROTATOR CUFF REPAIR      R 08/2010. L 09/2015.    HX WISDOM TEETH EXTRACTION  KNEE ARTHROSCOPY Left     2014    KNEE SURGERY  06/2013    L knee    LYMPH NODE BIOPSY Left 08/17/2018    LEFT SENTINEL LYMPH NODE BIOPSY performed by Arcelia Larraine BRAVO, MD at [facility] OR/PERIOP    MASTECTOMY, PARTIAL Left 08/17/2018    LEFT RADIOACTIVE SEED LOCALIZED LUMPECTOMY performed by Arcelia Larraine BRAVO, MD at [facility] OR/PER[facility]    SHOULDER SURGERY  R rotator 08/2010, L rotator2/2018   [3]   Family History  Problem Relation Age of Onset    Migraines Mother     Bladder Cancer Mother     High Cholesterol Mother     Hypertension Mother     Cancer Mother         bladder    Arthritis Mother     Heart Disease Father     Stroke Father     Coronary Artery Disease Father     High Cholesterol Father     Hypertension Father     Cancer-Breast Maternal Aunt         30s    Cancer-Prostate Maternal Grandfather     Migraines Maternal Grandfather     Stroke Maternal Grandmother     Anemia Brother     Arthritis-osteo Mother     Back pain Mother         bulging disc    Heart problem Father         bypass surgery   [4] No Known Allergies

## 2024-07-20 ENCOUNTER — Encounter: Admit: 2024-07-20 | Discharge: 2024-07-20 | Payer: MEDICARE

## 2024-07-20 VITALS — Wt 178.0 lb

## 2024-07-20 DIAGNOSIS — E66811 Obesity, Class I, BMI 30-34.9: Principal | ICD-10-CM

## 2024-07-20 NOTE — Progress Notes [1]
 Primary goal of visit: Follow-up nutrition counseling visit for Intensive Behavioral Therapy for Weight Management    Nutrition Assessment of Patient:    Ht Readings from Last 1 Encounters:   07/05/24 160 cm (5' 3)       Wt Readings from Last 5 Encounters:   07/20/24 80.7 kg (178 lb)   07/05/24 81.4 kg (179 lb 6.4 oz)   06/08/24 80.3 kg (177 lb)   04/19/24 79.8 kg (176 lb)   03/23/24 80.3 kg (177 lb)    *pt reported weight    Weight change: Lost 7 lbs since initial weight of 185 lbs on 03/24/23     Body mass index is 31.53 kg/m?[name]  BMI Categories Adult: class I obese   Total Energy Expenditure (based on Mifflin equation x activity factor): 1370 x 1.375 = 1870 kcal  Total Calorie Prescription for weight loss: 1200-1370 kcal  Needs to promote: weight loss and maintenance    Notes:     Holidays are challenging. Hosted Thanksgiving, lots of leftovers. Took a lot to her daughters. T[employer] popcorn - moved it out of sight.     Interested in 1:1 CBT sessions for exploring her eating behaviors. Will discuss with [facility] doctor.   Also interested in exploring phentermine . Feels metformin  increase not helping much. Recommended she discuss with provider in our clinic.     Highest wt 220#. 20% wt loss since, has been over course of adulthood.    Intervention/Plan:     1. Increase self-awareness and accountability through self-monitoring.    - Follow up with RD for support      2. Consume a minimum of 5 cups of fruits/vegetables daily.   Focus on getting at least half of foods as fruits and non-starchy vegetables. Whole grains, lean meats/eggs, some dairy, nuts and seeds made up the remainder of the diet. Foods both high in calories and low in nutritive quality are consumed rarely (baked goods, fried foods, high fat/processed meats, sweets).     - for parfait: try higher protein Greek yogurt (Ratio), instead of granola try nuts + seeds (chia, ground flax) to increase protein content  - for salad kits: use half of kit and add quick protein (canned beans, cottage cheese, packet of tuna) then save rest of salad for another meal     3. Progress to meet recommendation for 150+ minutes of physical activity for general health, while increasing to 300+ minutes weekly for continued weight loss or increased success for weight maintenance.   - gym 5 days/week for cardio + strength training  - water walking 1x/week     4. Increase activities of daily living and reduce sedentary time.    Step goal: 10,000 steps/day  - walk to mailbox, use bathroom farther away, take stairs, park in back of parking lot   - dog walks + dog park counts!     5. Make modifications to environment to support healthy lifestyle changes.           Nutrition Monitoring and Evaluation:     Time spent with patient: 20 minutes    Follow up in 6 week(s)    Contact information provided for questions     Thank you,    Mallie Duncan, RD LD

## 2024-07-20 NOTE — Patient Instructions [37]
-   for parfait: try higher protein Greek yogurt (Ratio), instead of granola try nuts + seeds (chia, ground flax) to increase protein content  - for salad kits: use half of kit and add quick protein (canned beans, cottage cheese, packet of tuna) then save rest of salad for another meal

## 2024-07-23 ENCOUNTER — Encounter: Admit: 2024-07-23 | Discharge: 2024-07-23 | Payer: MEDICARE

## 2024-07-23 ENCOUNTER — Ambulatory Visit: Admit: 2024-07-23 | Discharge: 2024-07-24 | Payer: MEDICARE

## 2024-07-25 ENCOUNTER — Encounter: Admit: 2024-07-25 | Discharge: 2024-07-25 | Payer: MEDICARE

## 2024-07-26 ENCOUNTER — Encounter: Admit: 2024-07-26 | Discharge: 2024-07-26 | Payer: MEDICARE

## 2024-07-26 DIAGNOSIS — E611 Iron deficiency: Principal | ICD-10-CM

## 2024-07-28 ENCOUNTER — Encounter: Admit: 2024-07-28 | Discharge: 2024-07-28 | Payer: MEDICARE

## 2024-07-28 ENCOUNTER — Ambulatory Visit: Admit: 2024-07-28 | Discharge: 2024-07-29 | Payer: MEDICARE

## 2024-07-28 DIAGNOSIS — Z23 Encounter for immunization: Principal | ICD-10-CM

## 2024-08-11 ENCOUNTER — Encounter: Admit: 2024-08-11 | Discharge: 2024-08-11 | Payer: MEDICARE

## 2024-08-11 ENCOUNTER — Ambulatory Visit: Admit: 2024-08-11 | Discharge: 2024-08-12 | Payer: MEDICARE

## 2024-08-12 ENCOUNTER — Encounter: Admit: 2024-08-12 | Discharge: 2024-08-12 | Payer: MEDICARE

## 2024-08-15 ENCOUNTER — Encounter: Admit: 2024-08-15 | Discharge: 2024-08-15 | Payer: MEDICARE

## 2024-08-17 ENCOUNTER — Encounter: Admit: 2024-08-17 | Discharge: 2024-08-17 | Payer: MEDICARE

## 2024-08-17 DIAGNOSIS — D509 Iron deficiency anemia, unspecified: Principal | ICD-10-CM

## 2024-08-17 MED ORDER — IRON SUCROSE 100 MG IRON/5 ML IV SOLN GROUP
200 mg | Freq: Once | INTRAVENOUS | 0 refills
Start: 2024-08-17 — End: ?

## 2024-08-17 NOTE — Progress Notes [1]
 Patient has iron  deficiency anemia noted on blood work. Her iron  saturation and hemoglobin remain low despite adequate oral iron  replacement. She does feel symptomatic, primarily fatigued. She would like to proceed with IV iron  infusions. Iron  infusions ordered.

## 2024-08-18 ENCOUNTER — Encounter: Admit: 2024-08-18 | Discharge: 2024-08-18 | Payer: MEDICARE

## 2024-08-18 NOTE — Progress Notes [1]
 CONFIDENTIAL Integrated Behavioral Health Consult  Note CONFIDENTIAL    PATIENT: Alicia Sharp  DOB: 1955-11-02  DOS: 08/19/2024  [facility] Provider Name: Jinny JONETTA Blunt, PSYD  Length of visit: 29 minutes    Subjective    History of Presenting Concern:   Referring Provider: Mallie Duncan, RD  Date of Referral: 07/20/24    Reason for referral/focus of Visit: Health risk behaviors Diet/weight management      Pt confirmed the following hx that was gathered from PCP referral and review of notes in electronic MEDICAL RECORD NUMBER    At their visit with Mallie Duncan, RD on 07/20/24:  Holidays are challenging. Hosted Thanksgiving, lots of leftovers. Took a lot to her daughters. Topsy's popcorn - moved it out of sight.      Interested in 1:1 CBT sessions for exploring her eating behaviors. Will discuss with [facility] doctor.   Also interested in exploring phentermine . Feels metformin  increase not helping much. Recommended she discuss with provider in our clinic.      Highest wt 220#. 20% wt loss since, has been over course of adulthood.       Last seen in General Medicine: 05/24/2024       Alicia Sharp is a 69 y.o.  female seen today  08/19/2024  for initial integrated behavior health consultation conducted :MyChart.     Provider introduced [facility] services and discussed limits of confidentiality, including that progress note will be written in medical chart, there will be open communication with PCP, and that a trainee may be involved in patient's care for future appts.  Pt agreed to proceed with treatment through [facility] services.     Presenting Concern:  Pt reported that she has been working on weight loss for most of her adult life. She stated that she struggles with overeating when at home. Pt reported that early messaging about wasting food contribute to this. She stated that she has tried reducing portion size. She stated that she often makes poor food choices despite knowing better. Pt reported that this happens at home and at restaurants. She identified self-sabotaging thinking related to special occasions and treats.        Objective    Patient completed mood screener for depression and anxiety prior to the session (GAD-7: Total Score: : 0 wnl anxiety sxs present, impact on psychosocial fxning Not difficult at all; PHQ-2 Score: 0, PHQ-9 Score: 2, wnl depression sxs present, impact on psychosocial fxning Not difficult at all, SI endorsed Not at all over the past two weeks.).  I instructed her to follow up in 3 weeks.    GAD-7: 0-4 wnl, 5-9 mild, 10-14 mod, 15+ severe  PHQ-9: 0-4 wnl, 5-9 mild, 10-14 mod, 15-19 mod/sev, 20+ severe              Mental Status:   Patient presented Calm mood, cooperative, and attentive duration of session.  Patient alert and oriented x4.  Pt maintained appropriate eye contact the duration of the session. Speech fluid, appropriate in tone and volume. Patient reported mood as Calm, affect WNL Patient does not appear to be experiencing any delusional thinking. Patient denies suicidal or homicidal ideation, auditory or visual hallucinations, or self-harm.         Specific Intervention: Brief CBT for weight management - explored pt's beliefs around food. Discussed utility of cognitive reminders        Patient was agreeable to receiving resources through MyChart: Weight management resources  Plan/Recommendation: The recommendation is continue integrated behavioral treatment for follow-up visit . The plan is to create cognitive reminders to review prior to eating in restaurants.

## 2024-08-19 ENCOUNTER — Encounter: Admit: 2024-08-19 | Discharge: 2024-08-19 | Payer: MEDICARE

## 2024-08-19 ENCOUNTER — Ambulatory Visit: Admit: 2024-08-19 | Discharge: 2024-08-20 | Payer: MEDICARE

## 2024-08-20 ENCOUNTER — Encounter: Admit: 2024-08-20 | Discharge: 2024-08-20 | Payer: MEDICARE

## 2024-08-23 ENCOUNTER — Encounter: Admit: 2024-08-23 | Discharge: 2024-08-23 | Payer: MEDICARE

## 2024-09-06 NOTE — Progress Notes [1]
 CONFIDENTIAL Integrated Behavioral Health Consult  Note CONFIDENTIAL      PATIENT: Alicia Sharp  DOB: Jan 18, 1956  DOS: 09/07/2024  [facility] Provider Name: Jinny JONETTA Blunt, PSYD  Length of Visit: 28 minutes    Subjective    History of Presenting Concern:              Referring Provider: Mallie Duncan, RD  Date of Referral: 07/20/24     Reason for referral/focus of Visit: Health risk behaviors Diet/weight management      Patient was last seen by [facility]BH on 08/19/2024 by Jinny Blunt, PsyD. At this visit, provider used Brief CBT for weight management - explored pt's beliefs around food. Discussed utility of cognitive reminders       Last seen in General Medicine: 08/19/2024       Alicia Sharp is a 69 y.o.  female seen today  09/07/2024  for a follow up/return integrated behavior health consultation via :Face to Face      Visit Content:  Pt reported challenges associated with travel in [place]. She stated that she effortful about starting meals with the protein and fruits and veggies or filling up on water. She stated that she is trying to be more mindful of checking in to determine if she was full. She reported that she has lost about 4 lbs. Pt identified 1 instance of overeating. Pt reported that upcoming celebrations may serve as triggers for overeating. She identified that deprivation has often served as a trigger for overeating.         Objective      08/19/2024    10:51 AM   LASTPHQ9   PHQ-9 Score 2          08/19/2024    10:52 AM 01/18/2019     1:05 PM   LAST GAD7   Total Score:  0 0    If you checked off any problems, how difficult have these problems made it for you to do your work, take care of things at home, or get along with other people? Not difficult at all Not difficult at all        Data saved with a previous flowsheet row definition         I instructed her to follow up in 1 month.     GAD-7: 0-4 wnl, 5-9 mild, 10-14 mod, 15+ severe  PHQ-9: 0-4 wnl, 5-9 mild, 10-14 mod, 15-19 mod/sev, 20+ severe        Mental Status:   Patient presented Calm mood, cooperative, and attentive duration of session.  Patient alert and oriented x4.  Pt maintained appropriate eye contact the duration of the session. Speech fluid, appropriate in tone and volume. Patient reported mood as Calm, affect WNL Patient does not appear to be experiencing any delusional thinking. Patient denies suicidal or homicidal ideation, auditory or visual hallucinations, or self-harm.         Specific Intervention: Brief CBT        Plan/Recommendation: The recommendation is continue integrated behavioral treatment for follow-up visit in 1 month. The plan is to continue using CBT skills for weight management.

## 2024-09-07 ENCOUNTER — Ambulatory Visit: Admit: 2024-09-07 | Discharge: 2024-09-08 | Payer: MEDICARE

## 2024-09-07 ENCOUNTER — Ambulatory Visit: Admit: 2024-09-07 | Discharge: 2024-09-07 | Payer: MEDICARE

## 2024-09-07 VITALS — Wt 174.6 lb

## 2024-09-07 DIAGNOSIS — E66811 Obesity, Class I, BMI 30-34.9: Principal | ICD-10-CM

## 2024-09-07 NOTE — Progress Notes [1]
 Primary goal of visit: Follow-up nutrition counseling visit for Intensive Behavioral Therapy for Weight Management    Nutrition Assessment of Patient:    Ht Readings from Last 1 Encounters:   07/05/24 160 cm (5' 3)       Wt Readings from Last 5 Encounters:   09/07/24 79.2 kg (174 lb 9.6 oz)   07/20/24 80.7 kg (178 lb)   07/05/24 81.4 kg (179 lb 6.4 oz)   06/08/24 80.3 kg (177 lb)   04/19/24 79.8 kg (176 lb)    *pt reported weight    Weight change: Lost 11 lbs since initial weight of 185 lbs on 03/24/23     Body mass index is 30.93 kg/m?[name]  BMI Categories Adult: class I obese   Total Energy Expenditure (based on Mifflin equation x activity factor): 1370 x 1.375 = 1870 kcal  Total Calorie Prescription for weight loss: 1200-1370 kcal  Needs to promote: weight loss and maintenance    Notes:     Met with Maya Alexander 2x now.   Started phentermine . Reports tolerating well.     Was gone for 2 weeks. Stayed with family in [place] briefly. Also had trip to [place]. Lots of walking, healthy eating. However, more carbs like tortillas, pancakes. Emphasized produce + proteins at meals.     Got some single serving chips/crackers. These are still a challenge.   Really liking adult lunchables.     More celebrations coming up.   House mate moving in this weekend.     Intervention/Plan:     1. Increase self-awareness and accountability through self-monitoring.    - Follow up with RD for support      2. Consume a minimum of 5 cups of fruits/vegetables daily.   Focus on getting at least half of foods as fruits and non-starchy vegetables. Whole grains, lean meats/eggs, some dairy, nuts and seeds made up the remainder of the diet. Foods both high in calories and low in nutritive quality are consumed rarely (baked goods, fried foods, high fat/processed meats, sweets).     - can work your chips/crackers into your adult lunchable     3. Progress to meet recommendation for 150+ minutes of physical activity for general health, while increasing to 300+ minutes weekly for continued weight loss or increased success for weight maintenance.   - gym 5 days/week for cardio + strength training  - water walking 1x/week     4. Increase activities of daily living and reduce sedentary time.    Step goal: 10,000 steps/day  - walk to mailbox, use bathroom farther away, take stairs, park in back of parking lot   - dog walks + dog park counts!     5. Make modifications to environment to support healthy lifestyle changes.   - limit how frequently you are purchasing crackers/chips      Nutrition Monitoring and Evaluation:     Time spent with patient: 20 minutes    Follow up in 2 month(s)    Thank you,    Mallie Duncan, RD LD

## 2024-09-13 ENCOUNTER — Ambulatory Visit: Admit: 2024-09-13 | Discharge: 2024-09-13 | Payer: MEDICARE

## 2024-09-13 ENCOUNTER — Encounter: Admit: 2024-09-13 | Discharge: 2024-09-13 | Payer: MEDICARE

## 2024-09-15 ENCOUNTER — Encounter: Admit: 2024-09-15 | Discharge: 2024-09-15 | Payer: MEDICARE
